# Patient Record
Sex: Male | Born: 1955 | Race: Black or African American | Hispanic: No | Smoking: Current some day smoker
Health system: Southern US, Community
[De-identification: ages and names within clinical notes are randomized; demographics above are authoritative.]

## PROBLEM LIST (undated history)

## (undated) DIAGNOSIS — Z87891 Personal history of nicotine dependence: Secondary | ICD-10-CM

## (undated) DIAGNOSIS — R569 Unspecified convulsions: Secondary | ICD-10-CM

## (undated) DIAGNOSIS — I10 Essential (primary) hypertension: Secondary | ICD-10-CM

## (undated) DIAGNOSIS — T4145XA Adverse effect of unspecified anesthetic, initial encounter: Secondary | ICD-10-CM

## (undated) DIAGNOSIS — I499 Cardiac arrhythmia, unspecified: Secondary | ICD-10-CM

## (undated) DIAGNOSIS — K2100 Gastro-esophageal reflux disease with esophagitis, without bleeding: Secondary | ICD-10-CM

## (undated) DIAGNOSIS — R51 Headache: Secondary | ICD-10-CM

## (undated) DIAGNOSIS — F419 Anxiety disorder, unspecified: Secondary | ICD-10-CM

## (undated) DIAGNOSIS — T8859XA Other complications of anesthesia, initial encounter: Secondary | ICD-10-CM

## (undated) DIAGNOSIS — N400 Enlarged prostate without lower urinary tract symptoms: Secondary | ICD-10-CM

## (undated) DIAGNOSIS — R519 Headache, unspecified: Secondary | ICD-10-CM

## (undated) DIAGNOSIS — E785 Hyperlipidemia, unspecified: Secondary | ICD-10-CM

## (undated) DIAGNOSIS — R0600 Dyspnea, unspecified: Secondary | ICD-10-CM

## (undated) DIAGNOSIS — R351 Nocturia: Secondary | ICD-10-CM

## (undated) DIAGNOSIS — M1990 Unspecified osteoarthritis, unspecified site: Secondary | ICD-10-CM

## (undated) DIAGNOSIS — R1310 Dysphagia, unspecified: Secondary | ICD-10-CM

## (undated) DIAGNOSIS — K219 Gastro-esophageal reflux disease without esophagitis: Secondary | ICD-10-CM

## (undated) HISTORY — PX: NASAL SEPTUM SURGERY: SHX37

## (undated) HISTORY — PX: OTHER SURGICAL HISTORY: SHX169

## (undated) HISTORY — PX: BACK SURGERY: SHX140

---

## 2013-10-20 ENCOUNTER — Encounter (HOSPITAL_COMMUNITY)
Admission: RE | Admit: 2013-10-20 | Discharge: 2013-10-20 | Disposition: A | Discharge status: Home / Self Care | Payer: Medicaid Other | Source: Ambulatory Visit | Attending: General Surgery | Admitting: General Surgery

## 2013-10-20 ENCOUNTER — Encounter (HOSPITAL_COMMUNITY): Payer: Self-pay

## 2013-10-20 HISTORY — DX: Essential (primary) hypertension: I10

## 2013-10-20 HISTORY — DX: Hyperlipidemia, unspecified: E78.5

## 2013-10-20 NOTE — OR Nursing (Signed)
Labs from [place] on paper chart

## 2013-10-20 NOTE — Patient Instructions (Addendum)
   Your procedure is scheduled on: 10/21/2013  Report to [facility] at   9:15  AM.  Call this number if you have problems the morning of surgery: [phone]   Remember:   Do not drink or eat food:After Midnight.  :  Take these medicines the morning of surgery with A SIP OF WATER: none   Do not wear jewelry, make-up or nail polish.  Do not wear lotions, powders, or perfumes. You may wear deodorant.  Do not shave 48 hours prior to surgery. Men may shave face and neck.  Do not bring valuables to the hospital.  Contacts, dentures or bridgework may not be worn into surgery.  Leave suitcase in the car. After surgery it may be brought to your room.  For patients admitted to the hospital, checkout time is 11:00 AM the day of discharge.   Patients discharged the day of surgery will not be allowed to drive home.    Special Instructions: Shower using CHG 2 nights before surgery and the night before surgery.  If you shower the day of surgery use CHG.  Use special wash - you have one bottle of CHG for all showers.  You should use approximately 1/3 of the bottle for each shower.   Please read over the following fact sheets that you were given: Pain Booklet, MRSA Information, Surgical Site Infection Prevention and Care and Recovery After Surgery

## 2013-10-21 ENCOUNTER — Encounter (HOSPITAL_COMMUNITY)
Admission: RE | Disposition: A | Discharge status: Home / Self Care | Payer: Self-pay | Source: Ambulatory Visit | Attending: General Surgery

## 2013-10-21 ENCOUNTER — Ambulatory Visit (HOSPITAL_COMMUNITY)
Admission: RE | Admit: 2013-10-21 | Discharge: 2013-10-21 | Disposition: A | Discharge status: Home / Self Care | Payer: Medicaid Other | Source: Ambulatory Visit | Attending: General Surgery | Admitting: General Surgery

## 2013-10-21 ENCOUNTER — Encounter (HOSPITAL_COMMUNITY): Payer: Medicaid Other | Admitting: Anesthesiology

## 2013-10-21 ENCOUNTER — Encounter (HOSPITAL_COMMUNITY): Payer: Self-pay | Admitting: *Deleted

## 2013-10-21 ENCOUNTER — Ambulatory Visit (HOSPITAL_COMMUNITY): Payer: Medicaid Other | Admitting: Anesthesiology

## 2013-10-21 DIAGNOSIS — I10 Essential (primary) hypertension: Secondary | ICD-10-CM | POA: Insufficient documentation

## 2013-10-21 DIAGNOSIS — D487 Neoplasm of uncertain behavior of other specified sites: Secondary | ICD-10-CM

## 2013-10-21 DIAGNOSIS — Z01810 Encounter for preprocedural cardiovascular examination: Secondary | ICD-10-CM | POA: Insufficient documentation

## 2013-10-21 DIAGNOSIS — L989 Disorder of the skin and subcutaneous tissue, unspecified: Secondary | ICD-10-CM | POA: Insufficient documentation

## 2013-10-21 DIAGNOSIS — D1739 Benign lipomatous neoplasm of skin and subcutaneous tissue of other sites: Secondary | ICD-10-CM | POA: Insufficient documentation

## 2013-10-21 HISTORY — PX: MASS EXCISION: SHX2000

## 2013-10-21 SURGERY — EXCISION MASS
Anesthesia: General | Site: Back | Wound class: Clean

## 2013-10-21 MED ORDER — ROCURONIUM BROMIDE 50 MG/5ML IV SOLN
INTRAVENOUS | Status: AC
  Filled 2013-10-21: qty 1

## 2013-10-21 MED ORDER — LACTATED RINGERS IV SOLN
INTRAVENOUS | Status: DC
  Administered 2013-10-21: 12:00:00 via INTRAVENOUS
  Administered 2013-10-21: 1000 mL via INTRAVENOUS

## 2013-10-21 MED ORDER — LIDOCAINE HCL (PF) 1 % IJ SOLN
INTRAMUSCULAR | Status: AC
  Filled 2013-10-21: qty 5

## 2013-10-21 MED ORDER — BACITRACIN-NEOMYCIN-POLYMYXIN 400-5-5000 EX OINT
TOPICAL_OINTMENT | CUTANEOUS | Status: AC
  Filled 2013-10-21: qty 2

## 2013-10-21 MED ORDER — EPHEDRINE SULFATE 50 MG/ML IJ SOLN
INTRAMUSCULAR | Status: DC | PRN
  Administered 2013-10-21: 5 mg via INTRAVENOUS

## 2013-10-21 MED ORDER — CEFAZOLIN SODIUM-DEXTROSE 2-3 GM-% IV SOLR
2.0000 g | Freq: Once | INTRAVENOUS | Status: AC
  Administered 2013-10-21: 2 g via INTRAVENOUS
  Filled 2013-10-21: qty 50

## 2013-10-21 MED ORDER — OXYCODONE-ACETAMINOPHEN 10-325 MG PO TABS: 1.0000 | ORAL_TABLET | ORAL | Status: DC | PRN

## 2013-10-21 MED ORDER — PROPOFOL 10 MG/ML IV BOLUS
INTRAVENOUS | Status: DC | PRN
  Administered 2013-10-21 (×2): 20 mg via INTRAVENOUS
  Administered 2013-10-21: 150 mg via INTRAVENOUS

## 2013-10-21 MED ORDER — MIDAZOLAM HCL 2 MG/2ML IJ SOLN
1.0000 mg | INTRAMUSCULAR | Status: AC | PRN
  Administered 2013-10-21 (×3): 2 mg via INTRAVENOUS
  Filled 2013-10-21 (×2): qty 2

## 2013-10-21 MED ORDER — EPHEDRINE SULFATE 50 MG/ML IJ SOLN
INTRAMUSCULAR | Status: AC
  Filled 2013-10-21: qty 1

## 2013-10-21 MED ORDER — FENTANYL CITRATE 0.05 MG/ML IJ SOLN
INTRAMUSCULAR | Status: DC | PRN
  Administered 2013-10-21 (×5): 25 ug via INTRAVENOUS
  Administered 2013-10-21: 50 ug via INTRAVENOUS
  Administered 2013-10-21: 25 ug via INTRAVENOUS

## 2013-10-21 MED ORDER — FENTANYL CITRATE 0.05 MG/ML IJ SOLN
INTRAMUSCULAR | Status: AC
  Filled 2013-10-21: qty 2

## 2013-10-21 MED ORDER — GLYCOPYRROLATE 0.2 MG/ML IJ SOLN
INTRAMUSCULAR | Status: AC
  Filled 2013-10-21: qty 2

## 2013-10-21 MED ORDER — 0.9 % SODIUM CHLORIDE (POUR BTL) OPTIME
TOPICAL | Status: DC | PRN
  Administered 2013-10-21: 1000 mL

## 2013-10-21 MED ORDER — GLYCOPYRROLATE 0.2 MG/ML IJ SOLN
INTRAMUSCULAR | Status: AC
  Filled 2013-10-21: qty 1

## 2013-10-21 MED ORDER — SODIUM CHLORIDE BACTERIOSTATIC 0.9 % IJ SOLN
INTRAMUSCULAR | Status: AC
  Filled 2013-10-21: qty 10

## 2013-10-21 MED ORDER — GLYCOPYRROLATE 0.2 MG/ML IJ SOLN
INTRAMUSCULAR | Status: DC | PRN
  Administered 2013-10-21: 0.4 mg via INTRAVENOUS
  Administered 2013-10-21: 0.2 mg via INTRAVENOUS

## 2013-10-21 MED ORDER — PROPOFOL 10 MG/ML IV BOLUS
INTRAVENOUS | Status: AC
  Filled 2013-10-21: qty 20

## 2013-10-21 MED ORDER — FENTANYL CITRATE 0.05 MG/ML IJ SOLN
25.0000 ug | INTRAMUSCULAR | Status: DC | PRN
  Administered 2013-10-21 (×3): 50 ug via INTRAVENOUS
  Filled 2013-10-21: qty 2

## 2013-10-21 MED ORDER — BACITRACIN ZINC 500 UNIT/GM EX OINT
TOPICAL_OINTMENT | CUTANEOUS | Status: AC
  Filled 2013-10-21: qty 0.9

## 2013-10-21 MED ORDER — ONDANSETRON HCL 4 MG/2ML IJ SOLN
4.0000 mg | Freq: Once | INTRAMUSCULAR | Status: AC
  Administered 2013-10-21: 4 mg via INTRAVENOUS
  Filled 2013-10-21: qty 2

## 2013-10-21 MED ORDER — NEOSTIGMINE METHYLSULFATE 1 MG/ML IJ SOLN
INTRAMUSCULAR | Status: DC | PRN
  Administered 2013-10-21: 2 mg via INTRAVENOUS

## 2013-10-21 MED ORDER — MIDAZOLAM HCL 2 MG/2ML IJ SOLN
INTRAMUSCULAR | Status: AC
  Filled 2013-10-21: qty 2

## 2013-10-21 MED ORDER — LIDOCAINE HCL (CARDIAC) 10 MG/ML IV SOLN
INTRAVENOUS | Status: DC | PRN
  Administered 2013-10-21: 20 mg via INTRAVENOUS

## 2013-10-21 MED ORDER — ROCURONIUM BROMIDE 100 MG/10ML IV SOLN
INTRAVENOUS | Status: DC | PRN
  Administered 2013-10-21: 35 mg via INTRAVENOUS
  Administered 2013-10-21: 5 mg via INTRAVENOUS
  Administered 2013-10-21: 10 mg via INTRAVENOUS

## 2013-10-21 MED ORDER — ONDANSETRON HCL 4 MG/2ML IJ SOLN: 4.0000 mg | Freq: Once | INTRAMUSCULAR | Status: DC | PRN

## 2013-10-21 MED ORDER — BUPIVACAINE HCL (PF) 0.5 % IJ SOLN
INTRAMUSCULAR | Status: AC
  Filled 2013-10-21: qty 30

## 2013-10-21 MED ORDER — STERILE WATER FOR IRRIGATION IR SOLN
Status: DC | PRN
  Administered 2013-10-21: 1000 mL

## 2013-10-21 SURGICAL SUPPLY — 39 items
0.5% BUPIVICAINE 30ML ×2 IMPLANT
BAG HAMPER (MISCELLANEOUS) ×2 IMPLANT
CLOTH BEACON ORANGE TIMEOUT ST (SAFETY) ×2 IMPLANT
COVER LIGHT HANDLE STERIS (MISCELLANEOUS) ×4 IMPLANT
DECANTER SPIKE VIAL GLASS SM (MISCELLANEOUS) ×2 IMPLANT
DRAIN PENROSE 18X1/2 LTX STRL (DRAIN) ×2 IMPLANT
ELECT NEEDLE TIP 2.8 STRL (NEEDLE) IMPLANT
ELECT REM PT RETURN 9FT ADLT (ELECTROSURGICAL) ×2
ELECTRODE REM PT RTRN 9FT ADLT (ELECTROSURGICAL) ×1 IMPLANT
FORMALIN 10 PREFIL 120ML (MISCELLANEOUS) ×2 IMPLANT
GLOVE BIOGEL PI IND STRL 7.0 (GLOVE) ×3 IMPLANT
GLOVE BIOGEL PI INDICATOR 7.0 (GLOVE) ×3
GLOVE ECLIPSE 7.0 STRL STRAW (GLOVE) ×4 IMPLANT
GLOVE SKINSENSE NS SZ7.0 (GLOVE) ×2
GLOVE SKINSENSE STRL SZ7.0 (GLOVE) ×2 IMPLANT
GLOVE SS BIOGEL STRL SZ 6.5 (GLOVE) ×1 IMPLANT
GLOVE SUPERSENSE BIOGEL SZ 6.5 (GLOVE) ×1
GOWN STRL REIN XL XLG (GOWN DISPOSABLE) ×6 IMPLANT
KIT ROOM TURNOVER APOR (KITS) ×2 IMPLANT
MANIFOLD NEPTUNE II (INSTRUMENTS) ×2 IMPLANT
NEEDLE HYPO 25X1 1.5 SAFETY (NEEDLE) IMPLANT
NS IRRIG 1000ML POUR BTL (IV SOLUTION) ×2 IMPLANT
PACK MINOR (CUSTOM PROCEDURE TRAY) IMPLANT
PAD ABD 5X9 TENDERSORB (GAUZE/BANDAGES/DRESSINGS) ×4 IMPLANT
PAD ARMBOARD 7.5X6 YLW CONV (MISCELLANEOUS) ×2 IMPLANT
SET BASIN LINEN APH (SET/KITS/TRAYS/PACK) ×2 IMPLANT
SPONGE GAUZE 4X4 12PLY (GAUZE/BANDAGES/DRESSINGS) ×2 IMPLANT
SPONGE LAP 18X18 X RAY DECT (DISPOSABLE) ×2 IMPLANT
SUT ETHILON 3 0 FSL (SUTURE) ×4 IMPLANT
SUT ETHILON 4 0 PS 2 18 (SUTURE) ×6 IMPLANT
SUT PROLENE 4 0 PS 2 18 (SUTURE) IMPLANT
SUT VIC AB 3-0 SH 27 (SUTURE)
SUT VIC AB 3-0 SH 27X BRD (SUTURE) IMPLANT
SUT VIC AB 4-0 PS2 27 (SUTURE) IMPLANT
SYR BULB IRRIGATION 50ML (SYRINGE) ×2 IMPLANT
SYR CONTROL 10ML LL (SYRINGE) IMPLANT
TAPE CLOTH SURG 4X10 WHT LF (GAUZE/BANDAGES/DRESSINGS) ×2 IMPLANT
TOWEL OR 17X26 4PK STRL BLUE (TOWEL DISPOSABLE) ×2 IMPLANT
WATER STERILE IRR 1000ML POUR (IV SOLUTION) ×4 IMPLANT

## 2013-10-21 NOTE — Anesthesia Postprocedure Evaluation (Addendum)
  Anesthesia Post-op Note  Patient: Xavier Livingston  Procedure(s) Performed: Procedure(s): EXCISION MASSES X 2 ON BACK (N/A)  Patient Location: PACU  Anesthesia Type:General  Level of Consciousness: awake, alert  and patient cooperative  Airway and Oxygen Therapy: Patient Spontanous Breathing  Post-op Pain:  7/10  Post-op Assessment: Post-op Vital signs reviewed, Patient's Cardiovascular Status Stable, Respiratory Function Stable and Patent Airway  Post-op Vital Signs: Reviewed and stable  Complications: No apparent anesthesia complications

## 2013-10-21 NOTE — Progress Notes (Signed)
Post OP check  Filed Vitals:   10/21/13 1215  BP: 131/95  Pulse: 95  Temp: 98.2 F (36.8 C)  Resp: 14   Dressing dry and in tact.  Pt explained that there might be some drainage and insrtucted to reinforce dressing and that I will change dressing and remove drain AM.  Pt having minimal pain and will discharge with Percocet if tylenol not sufficient.

## 2013-10-21 NOTE — Progress Notes (Signed)
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 Ago.  Procedure and risks explained and informed consent obtained.  Lesions marked pre op and labs reviewed.  No clinical change since H&P. Filed Vitals:   10/21/13 0945  BP: 126/88  Pulse:   Temp:   Resp:   temp. 97.7 , pulse 70, resp 22

## 2013-10-21 NOTE — Transfer of Care (Signed)
Immediate Anesthesia Transfer of Care Note  Patient: Xavier Livingston  Procedure(s) Performed: Procedure(s) (LRB): EXCISION MASSES X 2 ON BACK (N/A)  Patient Location: PACU  Anesthesia Type: General  Level of Consciousness: awake  Airway & Oxygen Therapy: Patient Spontanous Breathing    Post-op Assessment: Report given to PACU RN, Post -op Vital signs reviewed and stable and Patient moving all extremities  Post vital signs: Reviewed and stable  Complications: No apparent anesthesia complications

## 2013-10-21 NOTE — Anesthesia Preprocedure Evaluation (Signed)
Anesthesia Evaluation  Patient identified by MRN, date of birth, ID band Patient awake    Reviewed: Allergy & Precautions, H&P , NPO status , Patient's Chart, lab work & pertinent test results  Airway Mallampati: II TM Distance: >3 FB Neck ROM: Full    Dental  (+) Edentulous Upper and Edentulous Lower   Pulmonary Current Smoker (am cough),  breath sounds clear to auscultation        Cardiovascular hypertension, + dysrhythmias (hx irreg HR-no meds) Rhythm:Regular Rate:Normal     Neuro/Psych    GI/Hepatic negative GI ROS, GERD- (occasional)  ,  Endo/Other    Renal/GU      Musculoskeletal   Abdominal   Peds  Hematology   Anesthesia Other Findings   Reproductive/Obstetrics                           Anesthesia Physical Anesthesia Plan  ASA: II  Anesthesia Plan: General   Post-op Pain Management:    Induction: Intravenous, Rapid sequence and Cricoid pressure planned  Airway Management Planned: Oral ETT  Additional Equipment:   Intra-op Plan:   Post-operative Plan: Extubation in OR  Informed Consent: I have reviewed the patients History and Physical, chart, labs and discussed the procedure including the risks, benefits and alternatives for the proposed anesthesia with the patient or authorized representative who has indicated his/her understanding and acceptance.     Plan Discussed with:   Anesthesia Plan Comments:         Anesthesia Quick Evaluation

## 2013-10-21 NOTE — Anesthesia Procedure Notes (Signed)
Procedure Name: Intubation Date/Time: 10/21/2013 10:23 AM Performed by: Franco Nones Pre-anesthesia Checklist: Patient identified, Patient being monitored, Timeout performed, Emergency Drugs available and Suction available Patient Re-evaluated:Patient Re-evaluated prior to inductionOxygen Delivery Method: Circle System Utilized Preoxygenation: Pre-oxygenation with 100% oxygen Intubation Type: IV induction Ventilation: Mask ventilation without difficulty Laryngoscope Size: Miller and 2 Grade View: Grade I Tube type: Oral Tube size: 8.0 mm Number of attempts: 1 Airway Equipment and Method: stylet Placement Confirmation: ETT inserted through vocal cords under direct vision,  positive ETCO2 and breath sounds checked- equal and bilateral Secured at: 21 cm Tube secured with: Tape Dental Injury: Teeth and Oropharynx as per pre-operative assessment

## 2013-10-21 NOTE — Brief Op Note (Signed)
10/21/2013  12:20 PM  PATIENT:  Xavier Livingston  57 y.o. male  PRE-OPERATIVE DIAGNOSIS:  masses x 2 on back  POST-OPERATIVE DIAGNOSIS:  masses x 2 on back  PROCEDURE:  Procedure(s): EXCISION MASSES X 2 ON BACK (N/A)  SURGEON:  Surgeon(s) and Role:    * Marlane Hatcher, MD - Primary  PHYSICIAN ASSISTANT:   ASSISTANTS: none   ANESTHESIA:   general  EBL:  Total I/O In: 1000 [I.V.:1000] Out: 70 [Blood:70]  BLOOD ADMINISTERED:none  DRAINS: Penrose drain in the half inch penrose in sub cut space back.   LOCAL MEDICATIONS USED:  NONE  SPECIMEN:  Source of Specimen:  2 back lesions one midline the other smaller lesion left lower back.  DISPOSITION OF SPECIMEN:  PATHOLOGY  COUNTS:  YES  TOURNIQUET:  * No tourniquets in log *  DICTATION: .Other Dictation: Dictation Number OR dict. # [id number].  PLAN OF CARE: Discharge to home after PACU  PATIENT DISPOSITION:  PACU - hemodynamically stable.   Delay start of Pharmacological VTE agent (>24hrs) due to surgical blood loss or risk of bleeding: not applicable

## 2013-10-22 ENCOUNTER — Encounter (HOSPITAL_COMMUNITY): Payer: Self-pay | Admitting: General Surgery

## 2013-10-22 MED ORDER — BACITRACIN-NEOMYCIN-POLYMYXIN 400-5-5000 EX OINT
TOPICAL_OINTMENT | CUTANEOUS | Status: DC | PRN
  Administered 2013-10-21: 1 via TOPICAL

## 2013-10-22 NOTE — Op Note (Signed)
NAME:  Xavier Livingston, BASTON NO.:  [id number]  MEDICAL RECORD NO.:  [id number]  LOCATION:  [facility]                          FACILITY:  [facility]  PHYSICIAN:  Barbaraann Barthel, M.D. DATE OF BIRTH:  1956/05/01  DATE OF PROCEDURE:  10/21/2013 DATE OF DISCHARGE:  10/21/2013                              OPERATIVE REPORT   DIAGNOSIS:  Two back lesions (1 large lesion approximately the size of a softball.  It is likely a recurrent lipoma). The second lower lesion of left lower back about the size of a golf ball.  WOUND CLASSIFICATION:  Clean.  NOTE:  This is a 57 year old African American who had a previously removed lesion from his mid back area approximately 4 years ago.  This had recurred and was the size of a softball making it very difficult for him to lie down with any kind of comfort.  He also had a smaller lesion that was on a left lower back.  We discussed removal of this via the outpatient department, discussing complications, not limited to, but including bleeding, infection, the possibility that the drain may be placed and also that he may require a further surgery depending on the Pathology.  Informed consent was obtained.  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  TECHNIQUE:  The patient was placed in the right lateral decubitus position, and after prepping with Betadine solution and draping in the usual manner of the previously marked area in the left lower back was in size with a transverse incision, and this was dissected free and the wound was irrigated and then the bleeding was controlled with a cautery device and the wound was approximated with a 4-0 nylon.  Attention was then turned  to the larger lesion.  This was removed with a vertical incision as this was right over the spinal column.  Skin and subcutaneous tissue was excised and the large lipoma was then dissected free.  As there was some scarring from previous surgery, this was somewhat tedious.  However, there was no problem.  Bleeding was controlled with minimal blood loss less than 25 mL and since this left a large cavity, I elected to leave a Penrose drain and this exited from the wound site.  I will remove this drain in less than 24 hours.  The wound was approximated with 3-0 nylon and 4-0 nylon.  The drain was sutured in place with 4-0 nylon.  The sterile dressing and ABD pad was applied over the wound.  We did use Neosporin over both wounds as well.  Prior to closure, all sponge, needle, and instrument counts were found to be correct.  Estimated blood loss was as stated less than 25 mL.  There were no complications.     Barbaraann Barthel, M.D.     WB/MEDQ  D:  10/21/2013  T:  10/22/2013  Job:  [id number]  cc:  Melvyn Novas, MD Fax: [phone]

## 2013-11-18 ENCOUNTER — Other Ambulatory Visit (HOSPITAL_COMMUNITY): Payer: Self-pay | Admitting: General Surgery

## 2013-11-18 DIAGNOSIS — IMO0002 Reserved for concepts with insufficient information to code with codable children: Secondary | ICD-10-CM

## 2013-11-23 ENCOUNTER — Ambulatory Visit (HOSPITAL_COMMUNITY)
Admission: RE | Admit: 2013-11-23 | Discharge: 2013-11-23 | Disposition: A | Discharge status: Home / Self Care | Payer: Medicaid Other | Source: Ambulatory Visit | Attending: General Surgery | Admitting: General Surgery

## 2013-11-23 ENCOUNTER — Other Ambulatory Visit (HOSPITAL_COMMUNITY): Payer: Self-pay | Admitting: General Surgery

## 2013-11-23 DIAGNOSIS — R200 Anesthesia of skin: Secondary | ICD-10-CM

## 2013-11-23 DIAGNOSIS — R209 Unspecified disturbances of skin sensation: Secondary | ICD-10-CM | POA: Insufficient documentation

## 2013-11-23 DIAGNOSIS — M25519 Pain in unspecified shoulder: Secondary | ICD-10-CM | POA: Insufficient documentation

## 2013-11-24 ENCOUNTER — Ambulatory Visit (HOSPITAL_COMMUNITY)
Admission: RE | Admit: 2013-11-24 | Discharge: 2013-11-24 | Disposition: A | Discharge status: Home / Self Care | Payer: Medicaid Other | Source: Ambulatory Visit | Attending: General Surgery | Admitting: General Surgery

## 2013-12-09 ENCOUNTER — Other Ambulatory Visit (HOSPITAL_COMMUNITY)
Admission: RE | Admit: 2013-12-09 | Discharge: 2013-12-09 | Disposition: A | Discharge status: Home / Self Care | Payer: Medicaid Other | Source: Ambulatory Visit | Attending: General Surgery | Admitting: General Surgery

## 2013-12-09 DIAGNOSIS — D1779 Benign lipomatous neoplasm of other sites: Secondary | ICD-10-CM | POA: Insufficient documentation

## 2014-01-12 ENCOUNTER — Ambulatory Visit (HOSPITAL_COMMUNITY)
Admission: RE | Admit: 2014-01-12 | Discharge: 2014-01-12 | Disposition: A | Discharge status: Home / Self Care | Payer: Medicaid Other | Source: Ambulatory Visit | Attending: Family Medicine | Admitting: Family Medicine

## 2014-01-12 ENCOUNTER — Other Ambulatory Visit (HOSPITAL_COMMUNITY): Payer: Self-pay | Admitting: Family Medicine

## 2014-01-12 DIAGNOSIS — M1990 Unspecified osteoarthritis, unspecified site: Secondary | ICD-10-CM

## 2014-01-12 DIAGNOSIS — M25519 Pain in unspecified shoulder: Secondary | ICD-10-CM | POA: Insufficient documentation

## 2014-02-25 ENCOUNTER — Encounter (INDEPENDENT_AMBULATORY_CARE_PROVIDER_SITE_OTHER): Payer: Self-pay | Admitting: *Deleted

## 2014-02-25 IMAGING — CR DG SHOULDER 2+V*R*
3 series · 3 of 3 positions shown · non-contrast
Comparison: None

CLINICAL DATA: Right shoulder pain at clavicle-deltoid groove,
numbness

EXAM:
RIGHT SHOULDER - 2+ VIEW

[view not recorded (1 of 3)]
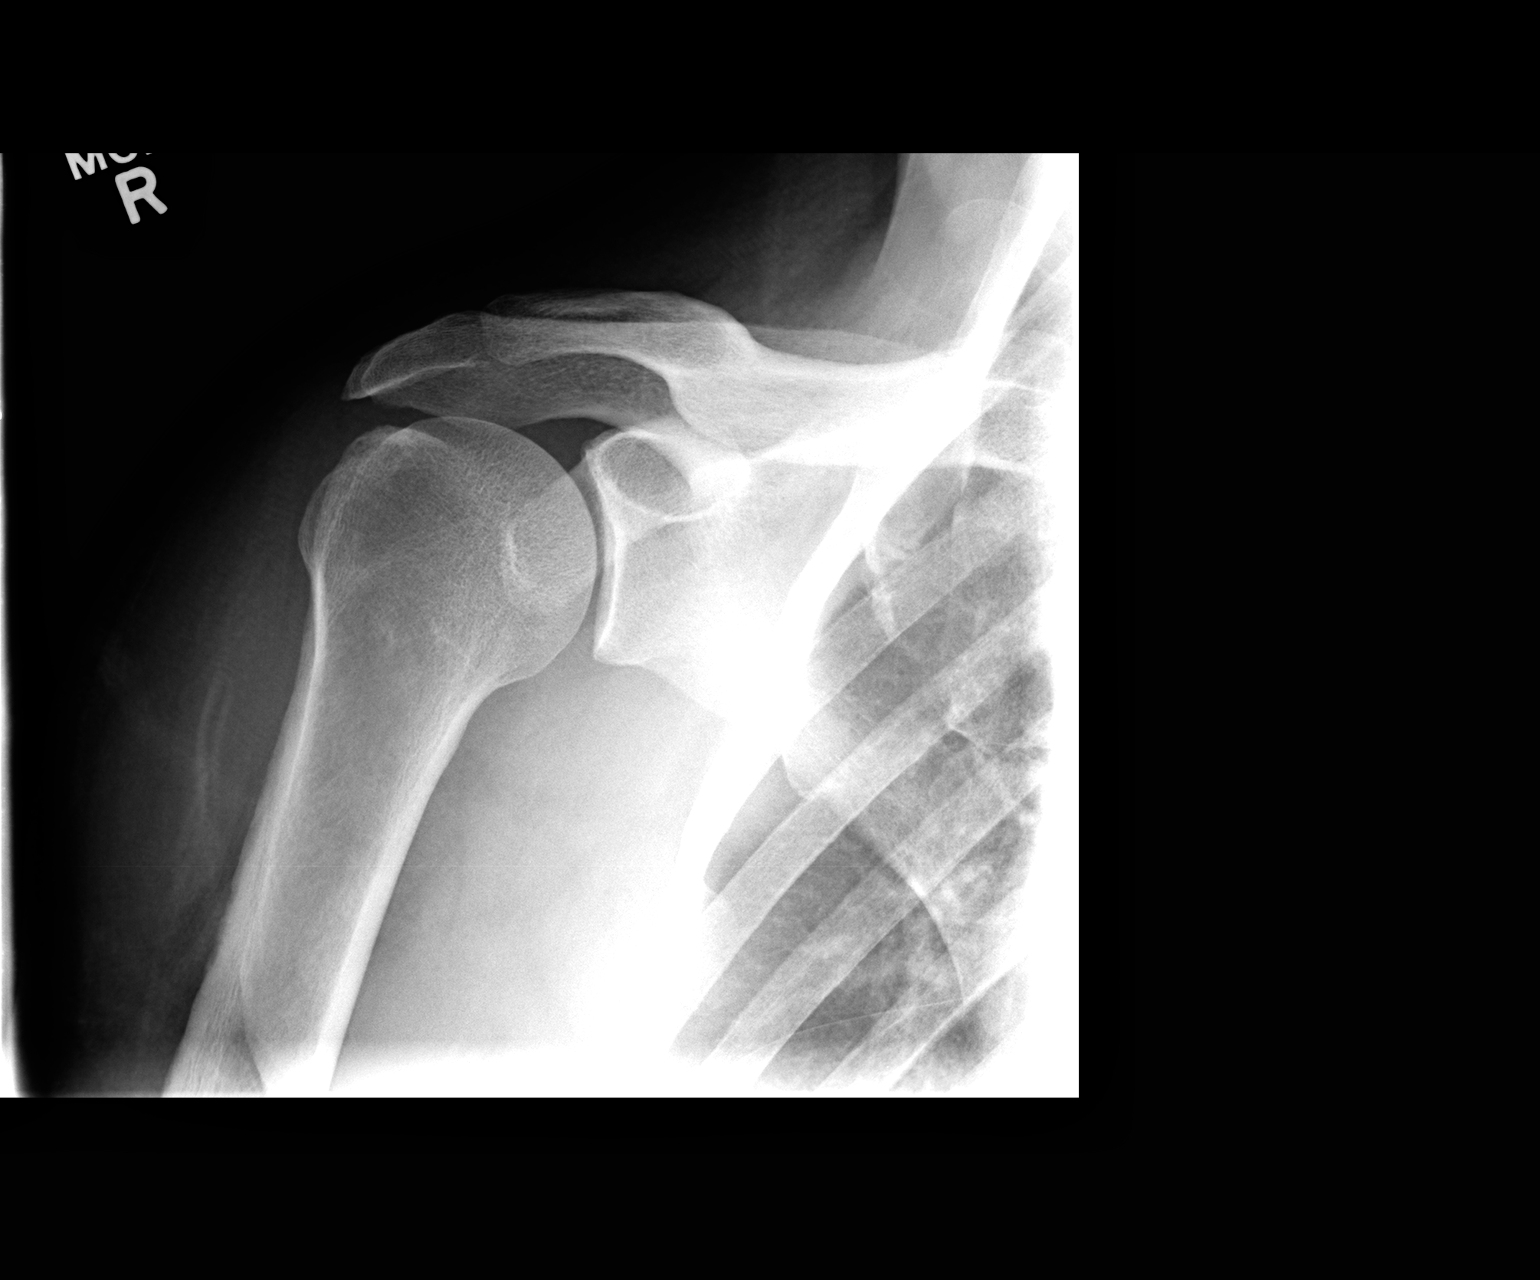

[view not recorded (2 of 3)]
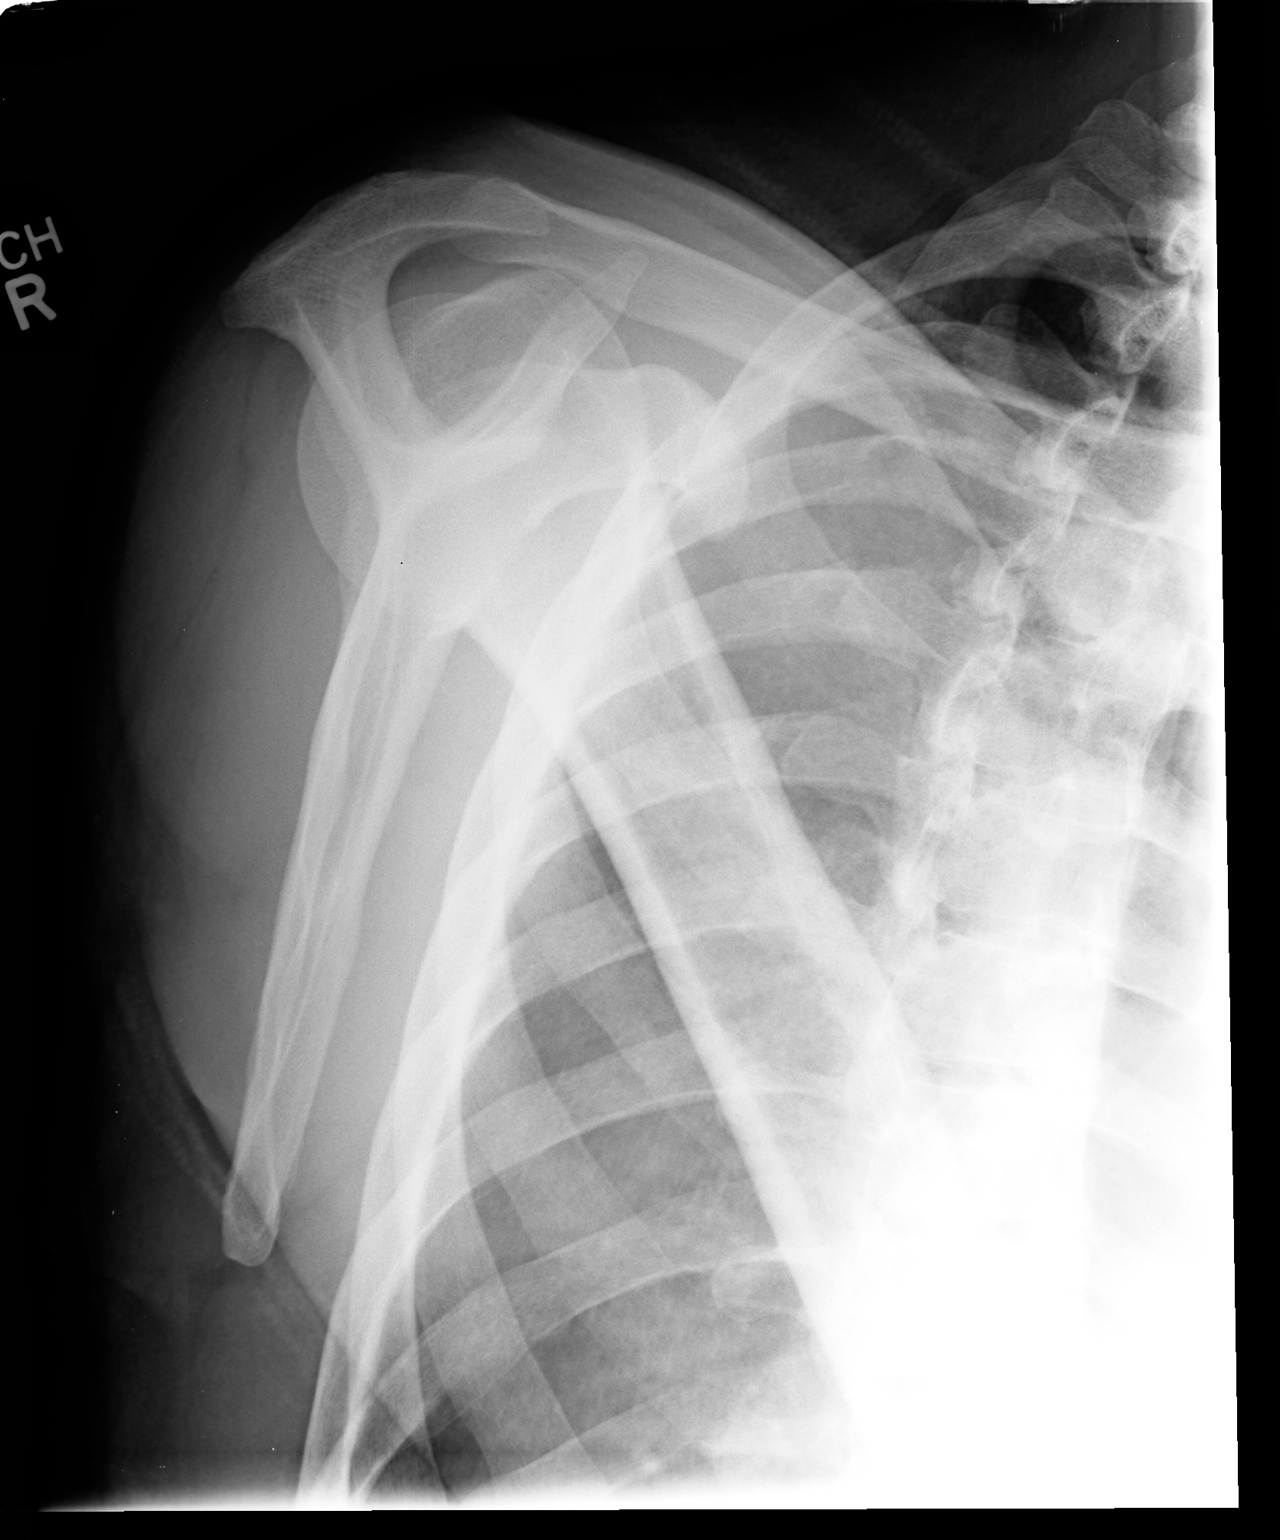

[view not recorded (3 of 3)]
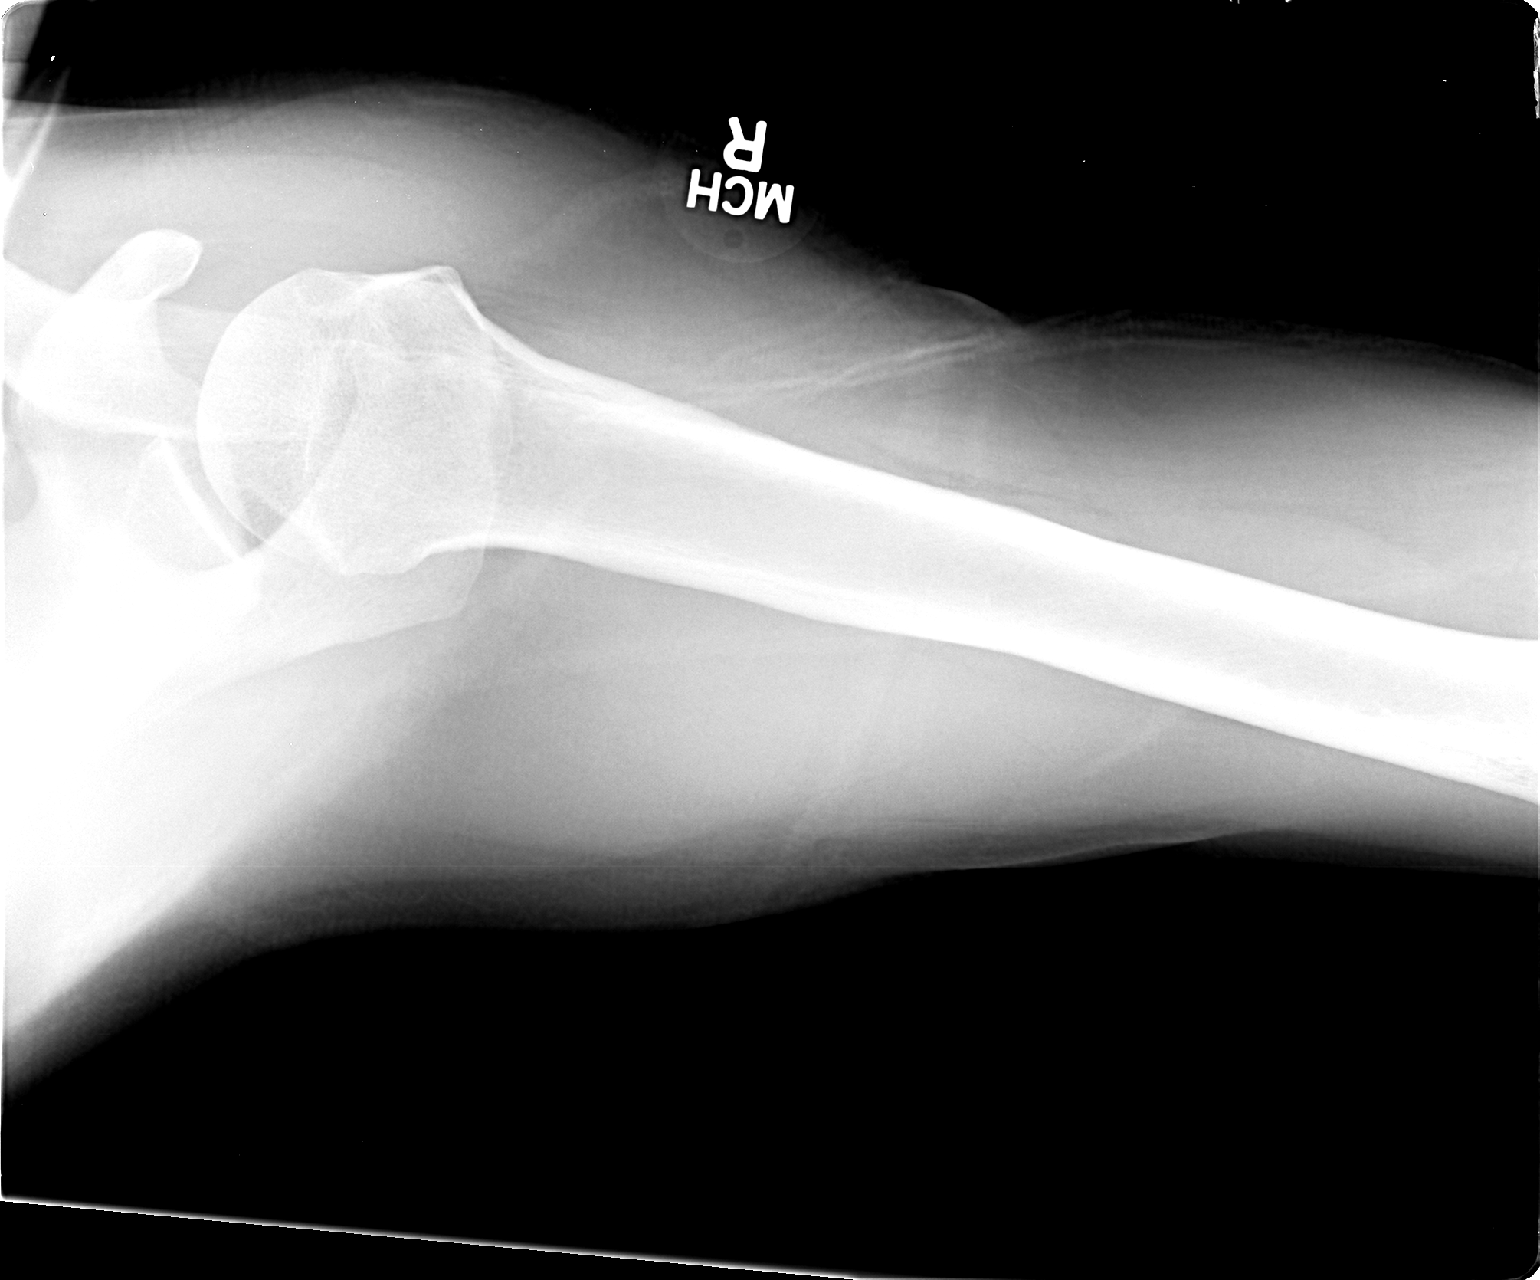

[3 of 3 positions shown; findings below may reference images not displayed]

FINDINGS: Osseous mineralization normal.

AC joint alignment normal.

No acute fracture, dislocation or bone destruction.

Visualized left ribs unremarkable.
IMPRESSION: Normal exam.

## 2014-03-17 ENCOUNTER — Encounter (INDEPENDENT_AMBULATORY_CARE_PROVIDER_SITE_OTHER): Payer: Self-pay | Admitting: Internal Medicine

## 2014-03-17 ENCOUNTER — Ambulatory Visit (INDEPENDENT_AMBULATORY_CARE_PROVIDER_SITE_OTHER): Payer: Medicaid Other | Admitting: Internal Medicine

## 2014-03-17 VITALS — BP 112/62 | HR 60 | Temp 98.2°F | Ht 71.0 in | Wt 179.2 lb

## 2014-03-17 DIAGNOSIS — R1310 Dysphagia, unspecified: Secondary | ICD-10-CM | POA: Insufficient documentation

## 2014-03-17 DIAGNOSIS — E7800 Pure hypercholesterolemia, unspecified: Secondary | ICD-10-CM | POA: Insufficient documentation

## 2014-03-17 DIAGNOSIS — I10 Essential (primary) hypertension: Secondary | ICD-10-CM | POA: Insufficient documentation

## 2014-03-17 MED ORDER — OMEPRAZOLE 20 MG PO CPDR: 20.0000 mg | DELAYED_RELEASE_CAPSULE | Freq: Every day | ORAL | Status: DC

## 2014-03-17 NOTE — Progress Notes (Signed)
Subjective:     Patient ID: Xavier Livingston, male   DOB: 12-06-1955, 58 y.o.   MRN: [id number]  HPI Referred to our office by [facility] for dysphagia. Dysphagia x 5 yrs. If he drinks water, he has to lean over to have the water go down. Foods are also lodging.   He says sometimes he can go a week and not have any problems with swallowing. He does not have problems with meat. Appetite is good. No weight loss. He does have acid reflux about twice a week. Occasionally has upper abdominal pain. Usually has a BM about once a day. No melena or bright red rectal bleeding. EGD 3 1/2 yrs ago for same in [place], [place] . He does not know what the results were.  He has never undergone a colonoscopy in the past.    Review of Systems Past Medical History  Diagnosis Date  . Hypertension   . Hyperlipemia     Past Surgical History  Procedure Laterality Date  . Nasal septum surgery    . Excision of mass of back    . Mass excision N/A 10/21/2013    Procedure: EXCISION MASSES X 2 ON BACK;  Surgeon: Scherry Ran, MD;  Location: [facility];  Service: General;  Laterality: N/A;    No Known Allergies  Current Outpatient Prescriptions on File Prior to Visit  Medication Sig Dispense Refill  . pravastatin (PRAVACHOL) 40 MG tablet Take 40 mg by mouth daily.       No current facility-administered medications on file prior to visit.        Objective:   Physical Exam  Filed Vitals:   03/17/14 1057  BP: 112/62  Pulse: 60  Temp: 98.2 F (36.8 C)  Height: 5\' 11"  (1.803 m)  Weight: 179 lb 3.2 oz (81.285 kg)   Alert and oriented. Skin warm and dry. Oral mucosa is moist.   . Sclera anicteric, conjunctivae is pink. Thyroid not enlarged. No cervical lymphadenopathy. Lungs clear. Heart regular rate and rhythm.  Abdomen is soft. Bowel sounds are positive. No hepatomegaly. No abdominal masses felt. Epigastric tenderness.  No edema to lower extremities.     Assessment:     Dyshagia to solids and  liquids. Stricture needs to be ruled out. GERD. Is not on a PPI at this time. In need of screening colonoscopy.    Plan:     Screening colonoscopy. Barium swallow. Further recommendations to follow.  Omeprazole 20mg  once a day.    Will hold the colonoscopy till she has the Barium Swallow.

## 2014-03-17 NOTE — Patient Instructions (Signed)
Rx for Omeprazole called to his pharmacy. Will schedule a Barium Swallow.

## 2014-03-23 ENCOUNTER — Ambulatory Visit (HOSPITAL_COMMUNITY)
Admission: RE | Admit: 2014-03-23 | Discharge: 2014-03-23 | Disposition: A | Discharge status: Home / Self Care | Payer: Medicaid Other | Source: Ambulatory Visit | Attending: Family Medicine | Admitting: Family Medicine

## 2014-03-23 ENCOUNTER — Other Ambulatory Visit (HOSPITAL_COMMUNITY): Payer: Self-pay | Admitting: Family Medicine

## 2014-03-23 DIAGNOSIS — M1990 Unspecified osteoarthritis, unspecified site: Secondary | ICD-10-CM

## 2014-03-23 DIAGNOSIS — M549 Dorsalgia, unspecified: Secondary | ICD-10-CM | POA: Insufficient documentation

## 2014-03-23 DIAGNOSIS — IMO0002 Reserved for concepts with insufficient information to code with codable children: Secondary | ICD-10-CM

## 2014-03-24 ENCOUNTER — Ambulatory Visit (HOSPITAL_COMMUNITY)
Admission: RE | Admit: 2014-03-24 | Discharge: 2014-03-24 | Disposition: A | Discharge status: Home / Self Care | Payer: Medicaid Other | Source: Ambulatory Visit | Attending: Internal Medicine | Admitting: Internal Medicine

## 2014-03-24 DIAGNOSIS — R1310 Dysphagia, unspecified: Secondary | ICD-10-CM

## 2014-04-02 ENCOUNTER — Telehealth (INDEPENDENT_AMBULATORY_CARE_PROVIDER_SITE_OTHER): Payer: Self-pay | Admitting: Internal Medicine

## 2014-04-06 ENCOUNTER — Other Ambulatory Visit (HOSPITAL_COMMUNITY): Payer: Self-pay | Admitting: Family Medicine

## 2014-04-06 ENCOUNTER — Ambulatory Visit (HOSPITAL_COMMUNITY)
Admission: RE | Admit: 2014-04-06 | Discharge: 2014-04-06 | Disposition: A | Discharge status: Home / Self Care | Payer: Medicaid Other | Source: Ambulatory Visit | Attending: Family Medicine | Admitting: Family Medicine

## 2014-04-06 DIAGNOSIS — R05 Cough: Secondary | ICD-10-CM | POA: Insufficient documentation

## 2014-04-06 DIAGNOSIS — R053 Chronic cough: Secondary | ICD-10-CM

## 2014-04-06 DIAGNOSIS — Z77090 Contact with and (suspected) exposure to asbestos: Secondary | ICD-10-CM | POA: Insufficient documentation

## 2014-04-06 DIAGNOSIS — R059 Cough, unspecified: Secondary | ICD-10-CM | POA: Insufficient documentation

## 2014-04-06 NOTE — Telephone Encounter (Signed)
error 

## 2014-05-04 ENCOUNTER — Encounter (INDEPENDENT_AMBULATORY_CARE_PROVIDER_SITE_OTHER): Payer: Self-pay | Admitting: *Deleted

## 2014-05-17 ENCOUNTER — Telehealth (INDEPENDENT_AMBULATORY_CARE_PROVIDER_SITE_OTHER): Payer: Self-pay | Admitting: *Deleted

## 2014-05-17 ENCOUNTER — Other Ambulatory Visit (INDEPENDENT_AMBULATORY_CARE_PROVIDER_SITE_OTHER): Payer: Self-pay | Admitting: *Deleted

## 2014-05-17 ENCOUNTER — Encounter (INDEPENDENT_AMBULATORY_CARE_PROVIDER_SITE_OTHER): Payer: Self-pay | Admitting: *Deleted

## 2014-05-17 DIAGNOSIS — Z1211 Encounter for screening for malignant neoplasm of colon: Secondary | ICD-10-CM

## 2014-05-17 NOTE — Telephone Encounter (Signed)
Patient was wanting to schedule a TCS. Message transferred to [facility].

## 2014-05-17 NOTE — Telephone Encounter (Signed)
TCS sch'd 08/18/14, patient aware

## 2014-05-17 NOTE — Telephone Encounter (Signed)
Phone call came into office Saturday at 2:31  No message left only very loud music playing

## 2014-05-17 NOTE — Telephone Encounter (Signed)
Thank you :)

## 2014-05-21 ENCOUNTER — Other Ambulatory Visit (HOSPITAL_COMMUNITY): Payer: Self-pay | Admitting: Family Medicine

## 2014-05-21 ENCOUNTER — Ambulatory Visit (HOSPITAL_COMMUNITY)
Admission: RE | Admit: 2014-05-21 | Discharge: 2014-05-21 | Disposition: A | Discharge status: Home / Self Care | Payer: Medicaid Other | Source: Ambulatory Visit | Attending: Family Medicine | Admitting: Family Medicine

## 2014-05-21 DIAGNOSIS — M545 Low back pain, unspecified: Secondary | ICD-10-CM | POA: Insufficient documentation

## 2014-05-21 DIAGNOSIS — IMO0002 Reserved for concepts with insufficient information to code with codable children: Secondary | ICD-10-CM

## 2014-07-05 ENCOUNTER — Telehealth (INDEPENDENT_AMBULATORY_CARE_PROVIDER_SITE_OTHER): Payer: Self-pay | Admitting: *Deleted

## 2014-07-05 DIAGNOSIS — Z1211 Encounter for screening for malignant neoplasm of colon: Secondary | ICD-10-CM

## 2014-07-05 NOTE — Telephone Encounter (Signed)
Patient needs movi prep 

## 2014-07-07 MED ORDER — PEG-KCL-NACL-NASULF-NA ASC-C 100 G PO SOLR: 1.0000 | Freq: Once | ORAL | Status: DC

## 2014-07-08 ENCOUNTER — Encounter (INDEPENDENT_AMBULATORY_CARE_PROVIDER_SITE_OTHER): Payer: Self-pay | Admitting: *Deleted

## 2014-07-22 ENCOUNTER — Telehealth (INDEPENDENT_AMBULATORY_CARE_PROVIDER_SITE_OTHER): Payer: Self-pay | Admitting: *Deleted

## 2014-07-22 NOTE — Telephone Encounter (Signed)
  Procedure: tcs  Reason/Indication:  screening  Has patient had this procedure before?  no  If so, when, by whom and where?    Is there a family history of colon cancer?  no  Who?  What age when diagnosed?    Is patient diabetic?   no      Does patient have prosthetic heart valve?  no  Do you have a pacemaker?  no  Has patient ever had endocarditis? no  Has patient had joint replacement within last 12 months?  no  Does patient tend to be constipated or take laxatives? occassionally   Is patient on Coumadin, Plavix and/or Aspirin? no  Medications: see epiv  Allergies: nkda  Medication Adjustment:   Procedure date & time: 08/18/14 at 1030

## 2014-07-22 NOTE — Telephone Encounter (Signed)
agree

## 2014-08-10 ENCOUNTER — Encounter (HOSPITAL_COMMUNITY): Payer: Self-pay | Admitting: Pharmacy Technician

## 2014-08-17 ENCOUNTER — Other Ambulatory Visit (INDEPENDENT_AMBULATORY_CARE_PROVIDER_SITE_OTHER): Payer: Self-pay | Admitting: *Deleted

## 2014-08-17 DIAGNOSIS — Z1211 Encounter for screening for malignant neoplasm of colon: Secondary | ICD-10-CM

## 2014-08-18 ENCOUNTER — Encounter (HOSPITAL_COMMUNITY)
Admission: RE | Disposition: A | Discharge status: Home / Self Care | Payer: Self-pay | Source: Ambulatory Visit | Attending: Internal Medicine

## 2014-08-18 ENCOUNTER — Ambulatory Visit (HOSPITAL_COMMUNITY)
Admission: RE | Admit: 2014-08-18 | Discharge: 2014-08-18 | Disposition: A | Discharge status: Home / Self Care | Payer: Medicaid Other | Source: Ambulatory Visit | Attending: Internal Medicine | Admitting: Internal Medicine

## 2014-08-18 ENCOUNTER — Encounter (HOSPITAL_COMMUNITY): Payer: Self-pay

## 2014-08-18 DIAGNOSIS — Z791 Long term (current) use of non-steroidal anti-inflammatories (NSAID): Secondary | ICD-10-CM | POA: Diagnosis not present

## 2014-08-18 DIAGNOSIS — Z79899 Other long term (current) drug therapy: Secondary | ICD-10-CM | POA: Diagnosis not present

## 2014-08-18 DIAGNOSIS — K644 Residual hemorrhoidal skin tags: Secondary | ICD-10-CM | POA: Insufficient documentation

## 2014-08-18 DIAGNOSIS — I10 Essential (primary) hypertension: Secondary | ICD-10-CM | POA: Insufficient documentation

## 2014-08-18 DIAGNOSIS — Q438 Other specified congenital malformations of intestine: Secondary | ICD-10-CM | POA: Insufficient documentation

## 2014-08-18 DIAGNOSIS — E785 Hyperlipidemia, unspecified: Secondary | ICD-10-CM | POA: Diagnosis not present

## 2014-08-18 DIAGNOSIS — K2960 Other gastritis without bleeding: Secondary | ICD-10-CM

## 2014-08-18 DIAGNOSIS — R1310 Dysphagia, unspecified: Secondary | ICD-10-CM | POA: Insufficient documentation

## 2014-08-18 DIAGNOSIS — K449 Diaphragmatic hernia without obstruction or gangrene: Secondary | ICD-10-CM

## 2014-08-18 DIAGNOSIS — Z1211 Encounter for screening for malignant neoplasm of colon: Secondary | ICD-10-CM

## 2014-08-18 HISTORY — PX: COLONOSCOPY: SHX5424

## 2014-08-18 HISTORY — PX: MALONEY DILATION: SHX5535

## 2014-08-18 HISTORY — PX: ESOPHAGOGASTRODUODENOSCOPY: SHX5428

## 2014-08-18 SURGERY — COLONOSCOPY
Anesthesia: Moderate Sedation

## 2014-08-18 MED ORDER — SODIUM CHLORIDE 0.9 % IV SOLN
INTRAVENOUS | Status: DC
Start: 2014-08-18 — End: 2014-08-18
  Administered 2014-08-18: 11:00:00 via INTRAVENOUS

## 2014-08-18 MED ORDER — MIDAZOLAM HCL 5 MG/5ML IJ SOLN
INTRAMUSCULAR | Status: DC
Start: 2014-08-18 — End: 2014-08-18
  Filled 2014-08-18: qty 10

## 2014-08-18 MED ORDER — MIDAZOLAM HCL 5 MG/5ML IJ SOLN
INTRAMUSCULAR | Status: DC
Start: 2014-08-18 — End: 2014-08-18
  Filled 2014-08-18: qty 5

## 2014-08-18 MED ORDER — SIMETHICONE 40 MG/0.6ML PO SUSP
ORAL | Status: AC
  Filled 2014-08-18: qty 0.6

## 2014-08-18 MED ORDER — STERILE WATER FOR IRRIGATION IR SOLN
Status: DC | PRN
  Administered 2014-08-18: 13:00:00

## 2014-08-18 MED ORDER — BUTAMBEN-TETRACAINE-BENZOCAINE 2-2-14 % EX AERO
INHALATION_SPRAY | CUTANEOUS | Status: DC | PRN
  Administered 2014-08-18: 2 via TOPICAL

## 2014-08-18 MED ORDER — MIDAZOLAM HCL 5 MG/5ML IJ SOLN
INTRAMUSCULAR | Status: DC | PRN
  Administered 2014-08-18 (×7): 2 mg via INTRAVENOUS

## 2014-08-18 MED ORDER — SIMETHICONE 40 MG/0.6ML PO SUSP
ORAL | Status: AC
  Filled 2014-08-18: qty 1.2

## 2014-08-18 MED ORDER — MEPERIDINE HCL 50 MG/ML IJ SOLN
INTRAMUSCULAR | Status: AC
  Filled 2014-08-18: qty 1

## 2014-08-18 MED ORDER — MEPERIDINE HCL 50 MG/ML IJ SOLN
INTRAMUSCULAR | Status: DC | PRN
  Administered 2014-08-18 (×2): 25 mg via INTRAVENOUS

## 2014-08-18 NOTE — H&P (Signed)
Xavier Livingston is an 58 y.o. male.   Chief Complaint: Patient is here for colonoscopy and EGD(add on). HPI: Patient is 58 year old [place] American male who is here for screening colonoscopy. He denies abdominal pain change in his bowel habits or rectal bleeding. Vision also complains of dysphagia. The symptoms started 2 years ago. This primarily with liquids and at times with solids. He points to the midsternal area site of bolus obstruction. When this occurs he has sharp pain. Patient underwent barium pill esophagogram on 03/24/2014. There was a question of tiny Zenker's diverticulum but no other abnormalities were encountered. Patient has been on pantoprazole for the past few months but cannot tell any difference. He does not experience heartburn often. He denies anorexia or weight loss. He is on Naprosyn which she uses a couple of times a week not every day. Family history is negative for CRC.  Past Medical History  Diagnosis Date  . Hypertension   . Hyperlipemia     Past Surgical History  Procedure Laterality Date  . Nasal septum surgery    . Excision of mass of back    . Mass excision N/A 10/21/2013    Procedure: EXCISION MASSES X 2 ON BACK;  Surgeon: Scherry Ran, MD;  Location: [facility];  Service: General;  Laterality: N/A;    History reviewed. No pertinent family history. Social History:  reports that he has been smoking Cigarettes.  He has been smoking about 0.25 packs per day. He does not have any smokeless tobacco history on file. He reports that he drinks alcohol. He reports that he uses illicit drugs.  Allergies: No Known Allergies  Medications Prior to Admission  Medication Sig Dispense Refill  . amLODipine (NORVASC) 5 MG tablet Take 5 mg by mouth daily.      . naproxen (NAPROSYN) 500 MG tablet Take 500 mg by mouth 2 (two) times daily with a meal.      . pantoprazole (PROTONIX) 40 MG tablet Take 40 mg by mouth daily.      . peg 3350 powder (MOVIPREP) 100 G SOLR  Take 1 kit (200 g total) by mouth once.  1 kit  0  . pravastatin (PRAVACHOL) 40 MG tablet Take 40 mg by mouth daily.      . silodosin (RAPAFLO) 8 MG CAPS capsule Take 8 mg by mouth daily with breakfast.        No results found for this or any previous visit (from the past 48 hour(s)). No results found.  ROS  Blood pressure 167/103, pulse 67, temperature 98.2 F (36.8 C), temperature source Oral, resp. rate 16, height '5\' 11"'  (1.803 m), weight 182 lb (82.555 kg), SpO2 99.00%. Physical Exam  Constitutional: He appears well-developed and well-nourished.  HENT:  Mouth/Throat: Oropharynx is clear and moist.  Eyes: Conjunctivae are normal. No scleral icterus.  Neck: No thyromegaly present.  Cardiovascular: Normal rate, regular rhythm and normal heart sounds.   No murmur heard. Respiratory: Effort normal and breath sounds normal.  GI: Soft. He exhibits no distension and no mass. There is no tenderness.  Musculoskeletal: He exhibits no edema.  Lymphadenopathy:    He has no cervical adenopathy.  Neurological: He is alert.  Skin: Skin is warm and dry.     Assessment/Plan Average risk screening colonoscopy. Dysphagia to liquids and solids. EGD and possible ED.   Harlym Gehling U 08/18/2014, 12:42 PM

## 2014-08-18 NOTE — Discharge Instructions (Signed)
Resume usual medications and diet. No driving for 24 hours. Physician will call with results of blood test.Esophagogastroduodenoscopy Care After Refer to this sheet in the next few weeks. These instructions provide you with information on caring for yourself after your procedure. Your caregiver may also give you more specific instructions. Your treatment has been planned according to current medical practices, but problems sometimes occur. Call your caregiver if you have any problems or questions after your procedure.  HOME CARE INSTRUCTIONS  Do not eat or drink anything until the numbing medicine (local anesthetic) has worn off and your gag reflex has returned. You will know that the local anesthetic has worn off when you can swallow comfortably.  Do not drive for 12 hours after the procedure or as directed by your caregiver.  Only take medicines as directed by your caregiver. SEEK MEDICAL CARE IF:   You cannot stop coughing.  You are not urinating at all or less than usual. SEEK IMMEDIATE MEDICAL CARE IF:  You have difficulty swallowing.  You cannot eat or drink.  You have worsening throat or chest pain.  You have dizziness, lightheadedness, or you faint.  You have nausea or vomiting.  You have chills.  You have a fever.  You have severe abdominal pain.  You have black, tarry, or bloody stools. Document Released: 11/05/2012 Document Reviewed: 11/05/2012 [facility] Patient Information 2015 [place]. This information is not intended to replace advice given to you by your health care provider. Make sure you discuss any questions you have with your health care provider. Colonoscopy, Care After Refer to this sheet in the next few weeks. These instructions provide you with information on caring for yourself after your procedure. Your health care provider may also give you more specific instructions. Your treatment has been planned according to current medical practices, but  problems sometimes occur. Call your health care provider if you have any problems or questions after your procedure. WHAT TO EXPECT AFTER THE PROCEDURE  After your procedure, it is typical to have the following:  A small amount of blood in your stool.  Moderate amounts of gas and mild abdominal cramping or bloating. HOME CARE INSTRUCTIONS  Do not drive, operate machinery, or sign important documents for 24 hours.  You may shower and resume your regular physical activities, but move at a slower pace for the first 24 hours.  Take frequent rest periods for the first 24 hours.  Walk around or put a warm pack on your abdomen to help reduce abdominal cramping and bloating.  Drink enough fluids to keep your urine clear or pale yellow.  You may resume your normal diet as instructed by your health care provider. Avoid heavy or fried foods that are hard to digest.  Avoid drinking alcohol for 24 hours or as instructed by your health care provider.  Only take over-the-counter or prescription medicines as directed by your health care provider.  If a tissue sample (biopsy) was taken during your procedure:  Do not take aspirin or blood thinners for 7 days, or as instructed by your health care provider.  Do not drink alcohol for 7 days, or as instructed by your health care provider.  Eat soft foods for the first 24 hours. SEEK MEDICAL CARE IF: You have persistent spotting of blood in your stool 2-3 days after the procedure. SEEK IMMEDIATE MEDICAL CARE IF:  You have more than a small spotting of blood in your stool.  You pass large blood clots in your stool.  Your abdomen is swollen (distended).  You have nausea or vomiting.  You have a fever.  You have increasing abdominal pain that is not relieved with medicine. Document Released: 07/03/2004 Document Revised: 09/09/2013 Document Reviewed: 07/27/2013 [facility] Patient Information 2015 [place], [place]. This information is not intended  to replace advice given to you by your health care provider. Make sure you discuss any questions you have with your health care provider.

## 2014-08-18 NOTE — Op Note (Signed)
COLONOSCOPY EGD and ED  PROCEDURE REPORT  PATIENT:  Xavier Livingston  MR#:  [id number] Birthdate:  01/09/1956, 58 y.o., male Endoscopist:  Dr. Rogene Houston, MD Referred By:  Dr. Maricela Curet, MD  Procedure Date: 08/18/2014  Procedure:    Colonoscopy followed by EGD and ED.  Indications:  Patient is a 58 year old male who is here for average risk screening colonoscopy. He also complains of worsening dysphagia primarily to liquids but also to solids. He underwent barium pill esophagogram in April of this year and was unremarkable other than question of a small Zenker's diverticulum. He is therefore also undergoing EGD and possible ED.            Informed Consent:  The risks, benefits, alternatives & imponderables which include, but are not limited to, bleeding, infection, perforation, drug reaction and potential missed lesion have been reviewed.  The potential for biopsy, lesion removal, esophageal dilation, etc. have also been discussed.  Questions have been answered.  All parties agreeable.  Please see history & physical in medical record for more information.  Medications:  Demerol 50 mg IV Versed 14 mg IV Cetacaine spray topically for oropharyngeal anesthesia    COLONOSCOPY Description of procedure:  After a digital rectal exam was performed, that colonoscope was advanced from the anus through the rectum and colon to the area of the cecum, ileocecal valve and appendiceal orifice. The cecum was deeply intubated. These structures were well-seen and photographed for the record. From the level of the cecum and ileocecal valve, the scope was slowly and cautiously withdrawn. The mucosal surfaces were carefully surveyed utilizing scope tip to flexion to facilitate fold flattening as needed. The scope was pulled down into the rectum where a thorough exam including retroflexion was performed.  Findings:   Prep satisfactory. Redundant colon but normal mucosa throughout. Normal rectal  mucosa. Small hemorrhoids below the dentate line.  Therapeutic/Diagnostic Maneuvers Performed:  None  Complications:  None  Cecal Withdrawal Time:  9 minutes  EGD  Description of procedure:  The endoscope was introduced through the mouth and advanced to the second portion of the duodenum without difficulty or limitations. The mucosal surfaces were surveyed very carefully during advancement of the scope and upon withdrawal.  Findings:  Esophagus:  Normal mucosa of the esophagus. GE junction without ring or stricture formation. No diverticulum noted in proximal esophagus. GEJ:  41 cm Hiatus:  43 cm Stomach:  Stomach was empty and distended very well with insufflation. Folds in the proximal stomach was normal. Examination of the mucosa the gastric body was normal. Patchy prepyloric erythema and edema noted. Pyloric channel was patent. Angularis fundus and cardia were unremarkable. Duodenum:  Normal bulbar and post bulbar mucosa.  Therapeutic/Diagnostic Maneuvers Performed:   Esophagus was dilated by passing  56 [place] Maloney dilator to full insertion. As the dilator was withdrawn  endoscope was passed again and no mucosal disruption noted to esophagus.  Impression:  Colonoscopy findings; External hemorrhoids otherwise normal colonoscopy.  EGD findings; Small sliding hiatal hernia without ring or stricture formation. Nonerosive antral gastritis. Esophagus dilated by passing 56 [place] Maloney dilator but no mucosal disruption induced.  Recommendations:  H. pylori serology. If patient remains with dysphagia will proceed with esophageal manometry and impedance study. Next screening exam in 10 years.  REHMAN,NAJEEB U  08/18/2014 1:22 PM  CC: Dr. Maricela Curet, MD & Dr. Rayne Du ref. provider found

## 2014-08-19 LAB — H. PYLORI ANTIBODY, IGG: H Pylori IgG: 0.78 {ISR}

## 2014-08-20 ENCOUNTER — Encounter (HOSPITAL_COMMUNITY): Payer: Self-pay | Admitting: Internal Medicine

## 2014-09-03 ENCOUNTER — Telehealth (INDEPENDENT_AMBULATORY_CARE_PROVIDER_SITE_OTHER): Payer: Self-pay | Admitting: *Deleted

## 2014-09-03 NOTE — Telephone Encounter (Signed)
Patient is sch'd for esophageal manometry with impedence study on 09/27/14 @ 1030 @ [facility], patient aware

## 2014-11-22 ENCOUNTER — Emergency Department (HOSPITAL_COMMUNITY)
Admission: EM | Admit: 2014-11-22 | Discharge: 2014-11-22 | Disposition: A | Discharge status: Home / Self Care | Payer: Medicaid Other | Attending: Emergency Medicine | Admitting: Emergency Medicine

## 2014-11-22 ENCOUNTER — Encounter (HOSPITAL_COMMUNITY): Payer: Self-pay | Admitting: Emergency Medicine

## 2014-11-22 DIAGNOSIS — Y9389 Activity, other specified: Secondary | ICD-10-CM | POA: Diagnosis not present

## 2014-11-22 DIAGNOSIS — Y998 Other external cause status: Secondary | ICD-10-CM | POA: Insufficient documentation

## 2014-11-22 DIAGNOSIS — T148XXA Other injury of unspecified body region, initial encounter: Secondary | ICD-10-CM

## 2014-11-22 DIAGNOSIS — Z9889 Other specified postprocedural states: Secondary | ICD-10-CM | POA: Diagnosis not present

## 2014-11-22 DIAGNOSIS — E785 Hyperlipidemia, unspecified: Secondary | ICD-10-CM | POA: Insufficient documentation

## 2014-11-22 DIAGNOSIS — Y92410 Unspecified street and highway as the place of occurrence of the external cause: Secondary | ICD-10-CM | POA: Insufficient documentation

## 2014-11-22 DIAGNOSIS — S29012A Strain of muscle and tendon of back wall of thorax, initial encounter: Secondary | ICD-10-CM | POA: Insufficient documentation

## 2014-11-22 DIAGNOSIS — I10 Essential (primary) hypertension: Secondary | ICD-10-CM | POA: Diagnosis not present

## 2014-11-22 DIAGNOSIS — S00511A Abrasion of lip, initial encounter: Secondary | ICD-10-CM | POA: Insufficient documentation

## 2014-11-22 DIAGNOSIS — S3992XA Unspecified injury of lower back, initial encounter: Secondary | ICD-10-CM | POA: Diagnosis present

## 2014-11-22 DIAGNOSIS — Z87891 Personal history of nicotine dependence: Secondary | ICD-10-CM | POA: Insufficient documentation

## 2014-11-22 DIAGNOSIS — Z79899 Other long term (current) drug therapy: Secondary | ICD-10-CM | POA: Diagnosis not present

## 2014-11-22 MED ORDER — DICLOFENAC SODIUM 75 MG PO TBEC: 75.0000 mg | DELAYED_RELEASE_TABLET | Freq: Two times a day (BID) | ORAL | Status: DC

## 2014-11-22 MED ORDER — METHOCARBAMOL 500 MG PO TABS: 500.0000 mg | ORAL_TABLET | Freq: Three times a day (TID) | ORAL | Status: DC
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
## 2015-01-10 ENCOUNTER — Other Ambulatory Visit (HOSPITAL_COMMUNITY): Payer: Self-pay | Admitting: Family Medicine

## 2015-01-10 DIAGNOSIS — M5441 Lumbago with sciatica, right side: Secondary | ICD-10-CM

## 2015-01-11 ENCOUNTER — Encounter (HOSPITAL_COMMUNITY): Payer: Self-pay

## 2015-01-11 ENCOUNTER — Ambulatory Visit (HOSPITAL_COMMUNITY)
Admission: RE | Admit: 2015-01-11 | Discharge: 2015-01-11 | Disposition: A | Discharge status: Home / Self Care | Payer: Medicaid Other | Source: Ambulatory Visit | Attending: Family Medicine | Admitting: Family Medicine

## 2015-01-11 DIAGNOSIS — M5441 Lumbago with sciatica, right side: Secondary | ICD-10-CM

## 2015-02-08 DIAGNOSIS — G959 Disease of spinal cord, unspecified: Secondary | ICD-10-CM | POA: Insufficient documentation

## 2015-03-08 DIAGNOSIS — M5030 Other cervical disc degeneration, unspecified cervical region: Secondary | ICD-10-CM | POA: Insufficient documentation

## 2015-03-29 ENCOUNTER — Other Ambulatory Visit: Payer: Self-pay | Admitting: Neurosurgery

## 2015-04-07 ENCOUNTER — Encounter (HOSPITAL_COMMUNITY)
Admission: RE | Admit: 2015-04-07 | Discharge: 2015-04-07 | Disposition: A | Discharge status: Home / Self Care | Payer: Medicaid Other | Source: Ambulatory Visit | Attending: Neurosurgery | Admitting: Neurosurgery

## 2015-04-07 ENCOUNTER — Encounter (HOSPITAL_COMMUNITY): Payer: Self-pay

## 2015-04-07 HISTORY — DX: Unspecified osteoarthritis, unspecified site: M19.90

## 2015-04-07 HISTORY — DX: Headache, unspecified: R51.9

## 2015-04-07 HISTORY — DX: Personal history of nicotine dependence: Z87.891

## 2015-04-07 HISTORY — DX: Nocturia: R35.1

## 2015-04-07 HISTORY — DX: Benign prostatic hyperplasia without lower urinary tract symptoms: N40.0

## 2015-04-07 HISTORY — DX: Gastro-esophageal reflux disease without esophagitis: K21.9

## 2015-04-07 HISTORY — DX: Headache: R51

## 2015-04-07 HISTORY — DX: Cardiac arrhythmia, unspecified: I49.9

## 2015-04-07 LAB — COMPREHENSIVE METABOLIC PANEL
ALT: 12 U/L — ABNORMAL LOW (ref 17–63)
AST: 22 U/L (ref 15–41)
Albumin: 4 g/dL (ref 3.5–5.0)
Alkaline Phosphatase: 98 U/L (ref 38–126)
Anion gap: 10 (ref 5–15)
BILIRUBIN TOTAL: 0.3 mg/dL (ref 0.3–1.2)
BUN: 9 mg/dL (ref 6–20)
CALCIUM: 9.3 mg/dL (ref 8.9–10.3)
CHLORIDE: 104 mmol/L (ref 101–111)
CO2: 26 mmol/L (ref 22–32)
CREATININE: 0.92 mg/dL (ref 0.61–1.24)
GLUCOSE: 85 mg/dL (ref 70–99)
Potassium: 4.2 mmol/L (ref 3.5–5.1)
Sodium: 140 mmol/L (ref 135–145)
Total Protein: 7.4 g/dL (ref 6.5–8.1)

## 2015-04-07 LAB — CBC
HCT: 47.7 % (ref 39.0–52.0)
Hemoglobin: 16.2 g/dL (ref 13.0–17.0)
MCH: 31.6 pg (ref 26.0–34.0)
MCHC: 34 g/dL (ref 30.0–36.0)
MCV: 93.2 fL (ref 78.0–100.0)
PLATELETS: 349 10*3/uL (ref 150–400)
RBC: 5.12 MIL/uL (ref 4.22–5.81)
RDW: 14.3 % (ref 11.5–15.5)
WBC: 8.2 10*3/uL (ref 4.0–10.5)

## 2015-04-07 LAB — SURGICAL PCR SCREEN
MRSA, PCR: POSITIVE — AB
Staphylococcus aureus: POSITIVE — AB

## 2015-04-07 NOTE — Progress Notes (Signed)
   04/07/15 1303  OBSTRUCTIVE SLEEP APNEA  Have you ever been diagnosed with sleep apnea through a sleep study? No  Do you snore loudly (loud enough to be heard through closed doors)?  0  Do you often feel tired, fatigued, or sleepy during the daytime? 0  Has anyone observed you stop breathing during your sleep? 0  Do you have, or are you being treated for high blood pressure? 1  BMI more than 35 kg/m2? 0  Age over 60 years old? 1  Neck circumference greater than 40 cm/16 inches? 0  Gender: 1

## 2015-04-07 NOTE — Pre-Procedure Instructions (Signed)
GIOVANNE NICKOLSON  04/07/2015   Your procedure is scheduled on:  Monday, Apr 11, 2015  Report to [facility] Admitting at 5:30 AM.  Call this number if you have problems the morning of surgery: [phone]   Remember:   Do not eat food or drink liquids after midnight Sunday, Apr 10, 2015   Take these medicines the morning of surgery with A SIP OF WATER: amLODipine (NORVASC),  pantoprazole (PROTONIX), silodosin (RAPAFLO), if needed: albuterol (PROAIR HFA)  inhaler for shortness of breath/wheezing ( Bring inhaler in on day of procedure).  Stop taking Aspirin, vitamins and herbal medications. Do not take any NSAIDs ie: Ibuprofen, Advil, Naproxen or any medication containing Aspirin; stop now.   Do not wear jewelry, make-up or nail polish.  Do not wear lotions, powders, or perfumes. You may not wear deodorant.  Do not shave 48 hours prior to surgery. Men may shave face and neck.  Do not bring valuables to the hospital.  [facility] is not responsible for any belongings or valuables.               Contacts, dentures or bridgework may not be worn into surgery.  Leave suitcase in the car. After surgery it may be brought to your room.  For patients admitted to the hospital, discharge time is determined by your treatment team.                  Special Instructions:  [place] - Preparing for Surgery  Before surgery, you can play an important role.  Because skin is not sterile, your skin needs to be as free of germs as possible.  You can reduce the number of germs on you skin by washing with CHG (chlorahexidine gluconate) soap before surgery.  CHG is an antiseptic cleaner which kills germs and bonds with the skin to continue killing germs even after washing.  Please DO NOT use if you have an allergy to CHG or antibacterial soaps.  If your skin becomes reddened/irritated stop using the CHG and inform your nurse when you arrive at Short Stay.  Do not shave (including legs and  underarms) for at least 48 hours prior to the first CHG shower.  You may shave your face.  Please follow these instructions carefully:   1.  Shower with CHG Soap the night before surgery and the morning of Surgery.  2.  If you choose to wash your hair, wash your hair first as usual with your normal shampoo.  3.  After you shampoo, rinse your hair and body thoroughly to remove the Shampoo.  4.  Use CHG as you would any other liquid soap.  You can apply chg directly  to the skin and wash gently with scrungie or a clean washcloth.  5.  Apply the CHG Soap to your body ONLY FROM THE NECK DOWN.  Do not use on open wounds or open sores.  Avoid contact with your eyes, ears, mouth and genitals (private parts).  Wash genitals (private parts) with your normal soap.  6.  Wash thoroughly, paying special attention to the area where your surgery will be performed.  7.  Thoroughly rinse your body with warm water from the neck down.  8.  DO NOT shower/wash with your normal soap after using and rinsing off the CHG Soap.  9.  Pat yourself dry with a clean towel.            10 .  Wear clean pajamas.  11.  Place clean sheets on your bed the night of your first shower and do not sleep with pets.  Day of Surgery  Do not apply any lotions/deodorants the morning of surgery.  Please wear clean clothes to the hospital/surgery center.   Please read over the following fact sheets that you were given: Pain Booklet, Coughing and Deep Breathing, MRSA Information and Surgical Site Infection Prevention

## 2015-04-08 ENCOUNTER — Encounter (HOSPITAL_COMMUNITY): Payer: Self-pay

## 2015-04-08 NOTE — Progress Notes (Signed)
Anesthesia Chart Review:  Pt is 59 year old male scheduled for C3-4, C4-5, C5-6, C6-7 ACDF on 04/11/2015 with Dr. Saintclair Halsted.   PMH includes: HTN, hyperlipidemia, dysrhythmia (pt reports irregular heartbeat in the 80's), GERD. Former smoker (quit one month ago). BMI 25. S/p excision of masses x2 on back 10/21/13.   Preoperative labs reviewed.    EKG: Sinus rhythm with 1st degree A-V block. Minimal voltage criteria for LVH, may be normal variant. Appears unchanged from 10/20/2013 tracing.   If no changes, I anticipate pt can proceed with surgery as scheduled.   Willeen Cass, FNP-BC [facility]/Anesthesiology Phone: [phone] 04/08/2015 1:42 PM

## 2015-04-10 NOTE — Anesthesia Preprocedure Evaluation (Addendum)
Anesthesia Evaluation  Patient identified by MRN, date of birth, ID band Patient awake    Reviewed: Allergy & Precautions, NPO status , Patient's Chart, lab work & pertinent test results  History of Anesthesia Complications Negative for: history of anesthetic complications  Airway Mallampati: II  TM Distance: >3 FB Neck ROM: Full    Dental no notable dental hx. (+) Edentulous Upper, Edentulous Lower, Dental Advisory Given   Pulmonary former smoker,  breath sounds clear to auscultation  Pulmonary exam normal       Cardiovascular Exercise Tolerance: Good hypertension, Pt. on medications Normal cardiovascular exam+ dysrhythmias Rhythm:Regular Rate:Normal     Neuro/Psych  Headaches, Reports some intermittent numbness/tingling in left forearm and hand with neck extension and flexion, also reports intermittent numbness in left lower lateral leg to foot negative psych ROS   GI/Hepatic negative GI ROS, Neg liver ROS, GERD-  Medicated and Controlled,  Endo/Other  negative endocrine ROS  Renal/GU negative Renal ROS  negative genitourinary   Musculoskeletal  (+) Arthritis -, Osteoarthritis,    Abdominal   Peds negative pediatric ROS (+)  Hematology negative hematology ROS (+)   Anesthesia Other Findings   Reproductive/Obstetrics negative OB ROS                            Anesthesia Physical Anesthesia Plan  ASA: II  Anesthesia Plan: General   Post-op Pain Management:    Induction: Intravenous  Airway Management Planned: Oral ETT  Additional Equipment:   Intra-op Plan:   Post-operative Plan: Extubation in OR  Informed Consent: I have reviewed the patients History and Physical, chart, labs and discussed the procedure including the risks, benefits and alternatives for the proposed anesthesia with the patient or authorized representative who has indicated his/her understanding and  acceptance.   Dental advisory given  Plan Discussed with: CRNA  Anesthesia Plan Comments:         Anesthesia Quick Evaluation

## 2015-04-11 ENCOUNTER — Inpatient Hospital Stay (HOSPITAL_COMMUNITY): Payer: Medicaid Other

## 2015-04-11 ENCOUNTER — Inpatient Hospital Stay (HOSPITAL_COMMUNITY)
Admission: RE | Admit: 2015-04-11 | Discharge: 2015-04-13 | DRG: 473 | Disposition: A | Discharge status: Home / Self Care | Payer: Medicaid Other | Source: Ambulatory Visit | Attending: Neurosurgery | Admitting: Neurosurgery

## 2015-04-11 ENCOUNTER — Encounter (HOSPITAL_COMMUNITY)
Admission: RE | Disposition: A | Discharge status: Home / Self Care | Payer: Self-pay | Source: Ambulatory Visit | Attending: Neurosurgery

## 2015-04-11 ENCOUNTER — Encounter (HOSPITAL_COMMUNITY): Payer: Self-pay | Admitting: *Deleted

## 2015-04-11 ENCOUNTER — Inpatient Hospital Stay (HOSPITAL_COMMUNITY): Payer: Medicaid Other | Admitting: Anesthesiology

## 2015-04-11 ENCOUNTER — Inpatient Hospital Stay (HOSPITAL_COMMUNITY): Payer: Medicaid Other | Admitting: Emergency Medicine

## 2015-04-11 DIAGNOSIS — Z79899 Other long term (current) drug therapy: Secondary | ICD-10-CM | POA: Diagnosis not present

## 2015-04-11 DIAGNOSIS — M4802 Spinal stenosis, cervical region: Secondary | ICD-10-CM | POA: Diagnosis present

## 2015-04-11 DIAGNOSIS — K219 Gastro-esophageal reflux disease without esophagitis: Secondary | ICD-10-CM | POA: Diagnosis present

## 2015-04-11 DIAGNOSIS — I10 Essential (primary) hypertension: Secondary | ICD-10-CM | POA: Diagnosis present

## 2015-04-11 DIAGNOSIS — M4712 Other spondylosis with myelopathy, cervical region: Principal | ICD-10-CM | POA: Diagnosis present

## 2015-04-11 DIAGNOSIS — M542 Cervicalgia: Secondary | ICD-10-CM | POA: Diagnosis present

## 2015-04-11 DIAGNOSIS — R1319 Other dysphagia: Secondary | ICD-10-CM | POA: Diagnosis not present

## 2015-04-11 DIAGNOSIS — E785 Hyperlipidemia, unspecified: Secondary | ICD-10-CM | POA: Diagnosis present

## 2015-04-11 DIAGNOSIS — Z419 Encounter for procedure for purposes other than remedying health state, unspecified: Secondary | ICD-10-CM

## 2015-04-11 DIAGNOSIS — Z87891 Personal history of nicotine dependence: Secondary | ICD-10-CM | POA: Diagnosis not present

## 2015-04-11 DIAGNOSIS — Z7951 Long term (current) use of inhaled steroids: Secondary | ICD-10-CM | POA: Diagnosis not present

## 2015-04-11 HISTORY — PX: ANTERIOR CERVICAL DECOMPRESSION/DISCECTOMY FUSION 4 LEVELS: SHX5556

## 2015-04-11 SURGERY — ANTERIOR CERVICAL DECOMPRESSION/DISCECTOMY FUSION 4 LEVELS
Anesthesia: General | Site: Neck

## 2015-04-11 MED ORDER — LABETALOL HCL 5 MG/ML IV SOLN: 5.0000 mg | Freq: Once | INTRAVENOUS | Status: DC

## 2015-04-11 MED ORDER — ALUM & MAG HYDROXIDE-SIMETH 200-200-20 MG/5ML PO SUSP: 30.0000 mL | Freq: Four times a day (QID) | ORAL | Status: DC | PRN

## 2015-04-11 MED ORDER — ONDANSETRON HCL 4 MG/2ML IJ SOLN
INTRAMUSCULAR | Status: DC | PRN
  Administered 2015-04-11: 4 mg via INTRAVENOUS

## 2015-04-11 MED ORDER — FENTANYL CITRATE (PF) 100 MCG/2ML IJ SOLN
INTRAMUSCULAR | Status: AC
  Filled 2015-04-11: qty 2

## 2015-04-11 MED ORDER — THROMBIN 5000 UNITS EX SOLR
OROMUCOSAL | Status: DC | PRN
  Administered 2015-04-11 (×2): via TOPICAL

## 2015-04-11 MED ORDER — FENTANYL CITRATE (PF) 100 MCG/2ML IJ SOLN
INTRAMUSCULAR | Status: DC | PRN
  Administered 2015-04-11 (×5): 50 ug via INTRAVENOUS
  Administered 2015-04-11: 100 ug via INTRAVENOUS
  Administered 2015-04-11 (×3): 50 ug via INTRAVENOUS

## 2015-04-11 MED ORDER — PRAVASTATIN SODIUM 40 MG PO TABS
40.0000 mg | ORAL_TABLET | Freq: Every day | ORAL | Status: DC
  Administered 2015-04-12 – 2015-04-13 (×2): 40 mg via ORAL
  Filled 2015-04-11 (×2): qty 1

## 2015-04-11 MED ORDER — MENTHOL 3 MG MT LOZG: 1.0000 | LOZENGE | OROMUCOSAL | Status: DC | PRN

## 2015-04-11 MED ORDER — LIDOCAINE HCL (CARDIAC) 20 MG/ML IV SOLN
INTRAVENOUS | Status: AC
  Filled 2015-04-11: qty 5

## 2015-04-11 MED ORDER — HYDRALAZINE HCL 20 MG/ML IJ SOLN
INTRAMUSCULAR | Status: AC
  Administered 2015-04-11: 5 mg
  Filled 2015-04-11: qty 1

## 2015-04-11 MED ORDER — ONDANSETRON HCL 4 MG/2ML IJ SOLN: 4.0000 mg | INTRAMUSCULAR | Status: DC | PRN

## 2015-04-11 MED ORDER — VECURONIUM BROMIDE 10 MG IV SOLR
INTRAVENOUS | Status: DC | PRN
  Administered 2015-04-11: 1 mg via INTRAVENOUS
  Administered 2015-04-11 (×3): 2 mg via INTRAVENOUS
  Administered 2015-04-11: 1 mg via INTRAVENOUS

## 2015-04-11 MED ORDER — ACETAMINOPHEN 10 MG/ML IV SOLN
1000.0000 mg | Freq: Once | INTRAVENOUS | Status: AC
Start: 2015-04-11 — End: 2015-04-11
  Administered 2015-04-11: 1000 mg via INTRAVENOUS

## 2015-04-11 MED ORDER — ACETAMINOPHEN 10 MG/ML IV SOLN
INTRAVENOUS | Status: AC
  Filled 2015-04-11: qty 100

## 2015-04-11 MED ORDER — PHENYLEPHRINE HCL 10 MG/ML IJ SOLN
10.0000 mg | INTRAVENOUS | Status: DC | PRN
  Administered 2015-04-11: 15 ug/min via INTRAVENOUS

## 2015-04-11 MED ORDER — BACITRACIN 50000 UNITS IM SOLR
INTRAMUSCULAR | Status: DC | PRN
  Administered 2015-04-11: 09:00:00

## 2015-04-11 MED ORDER — LIDOCAINE HCL (CARDIAC) 20 MG/ML IV SOLN
INTRAVENOUS | Status: DC | PRN
  Administered 2015-04-11: 20 mg via INTRAVENOUS

## 2015-04-11 MED ORDER — FENTANYL CITRATE (PF) 250 MCG/5ML IJ SOLN
INTRAMUSCULAR | Status: AC
  Filled 2015-04-11: qty 5

## 2015-04-11 MED ORDER — PHENYLEPHRINE HCL 10 MG/ML IJ SOLN
INTRAMUSCULAR | Status: DC | PRN
  Administered 2015-04-11 (×3): 80 ug via INTRAVENOUS

## 2015-04-11 MED ORDER — LABETALOL HCL 5 MG/ML IV SOLN
INTRAVENOUS | Status: AC
  Filled 2015-04-11: qty 4

## 2015-04-11 MED ORDER — 0.9 % SODIUM CHLORIDE (POUR BTL) OPTIME
TOPICAL | Status: DC | PRN
  Administered 2015-04-11: 1000 mL

## 2015-04-11 MED ORDER — THROMBIN 20000 UNITS EX SOLR
CUTANEOUS | Status: DC | PRN
  Administered 2015-04-11: 09:00:00 via TOPICAL

## 2015-04-11 MED ORDER — MUPIROCIN 2 % EX OINT
1.0000 "application " | TOPICAL_OINTMENT | Freq: Two times a day (BID) | CUTANEOUS | Status: AC
  Administered 2015-04-11: 1 via NASAL

## 2015-04-11 MED ORDER — TAMSULOSIN HCL 0.4 MG PO CAPS
0.4000 mg | ORAL_CAPSULE | Freq: Every morning | ORAL | Status: DC
  Administered 2015-04-11 – 2015-04-13 (×3): 0.4 mg via ORAL
  Filled 2015-04-11 (×3): qty 1

## 2015-04-11 MED ORDER — LIDOCAINE HCL 4 % MT SOLN
OROMUCOSAL | Status: DC | PRN
  Administered 2015-04-11: 4 mL via TOPICAL

## 2015-04-11 MED ORDER — SODIUM CHLORIDE 0.9 % IJ SOLN: 3.0000 mL | INTRAMUSCULAR | Status: DC | PRN

## 2015-04-11 MED ORDER — MIDAZOLAM HCL 5 MG/5ML IJ SOLN
INTRAMUSCULAR | Status: DC | PRN
  Administered 2015-04-11: 2 mg via INTRAVENOUS

## 2015-04-11 MED ORDER — GLYCOPYRROLATE 0.2 MG/ML IJ SOLN
INTRAMUSCULAR | Status: DC | PRN
  Administered 2015-04-11: .6 mg via INTRAVENOUS

## 2015-04-11 MED ORDER — FENTANYL CITRATE (PF) 100 MCG/2ML IJ SOLN
25.0000 ug | INTRAMUSCULAR | Status: DC | PRN
  Administered 2015-04-11 (×3): 50 ug via INTRAVENOUS

## 2015-04-11 MED ORDER — NEOSTIGMINE METHYLSULFATE 10 MG/10ML IV SOLN
INTRAVENOUS | Status: DC | PRN
  Administered 2015-04-11: 4 mg via INTRAVENOUS

## 2015-04-11 MED ORDER — ACETAMINOPHEN 325 MG PO TABS: 650.0000 mg | ORAL_TABLET | ORAL | Status: DC | PRN

## 2015-04-11 MED ORDER — CYCLOBENZAPRINE HCL 10 MG PO TABS
10.0000 mg | ORAL_TABLET | Freq: Three times a day (TID) | ORAL | Status: DC | PRN
  Administered 2015-04-11 – 2015-04-13 (×4): 10 mg via ORAL
  Filled 2015-04-11 (×4): qty 1

## 2015-04-11 MED ORDER — ONDANSETRON HCL 4 MG/2ML IJ SOLN
4.0000 mg | Freq: Once | INTRAMUSCULAR | Status: DC | PRN
Start: 2015-04-11 — End: 2015-04-11

## 2015-04-11 MED ORDER — PHENOL 1.4 % MT LIQD
1.0000 | OROMUCOSAL | Status: DC | PRN
  Administered 2015-04-11: 1 via OROMUCOSAL
  Filled 2015-04-11: qty 177

## 2015-04-11 MED ORDER — HYDROCODONE-ACETAMINOPHEN 5-325 MG PO TABS: 1.0000 | ORAL_TABLET | Freq: Four times a day (QID) | ORAL | Status: DC | PRN

## 2015-04-11 MED ORDER — OXYCODONE-ACETAMINOPHEN 5-325 MG PO TABS: 1.0000 | ORAL_TABLET | Freq: Three times a day (TID) | ORAL | Status: DC | PRN

## 2015-04-11 MED ORDER — HYDROMORPHONE HCL 1 MG/ML IJ SOLN
0.5000 mg | INTRAMUSCULAR | Status: DC | PRN
  Administered 2015-04-11: 1 mg via INTRAVENOUS
  Filled 2015-04-11: qty 1

## 2015-04-11 MED ORDER — MIDAZOLAM HCL 2 MG/2ML IJ SOLN
INTRAMUSCULAR | Status: AC
  Filled 2015-04-11: qty 2

## 2015-04-11 MED ORDER — DEXAMETHASONE 4 MG PO TABS
4.0000 mg | ORAL_TABLET | Freq: Four times a day (QID) | ORAL | Status: DC
  Administered 2015-04-11 – 2015-04-13 (×7): 4 mg via ORAL
  Filled 2015-04-11 (×11): qty 1

## 2015-04-11 MED ORDER — PROPOFOL 10 MG/ML IV BOLUS
INTRAVENOUS | Status: AC
  Filled 2015-04-11: qty 20

## 2015-04-11 MED ORDER — ROCURONIUM BROMIDE 100 MG/10ML IV SOLN
INTRAVENOUS | Status: DC | PRN
  Administered 2015-04-11: 50 mg via INTRAVENOUS

## 2015-04-11 MED ORDER — PROPOFOL 10 MG/ML IV BOLUS
INTRAVENOUS | Status: DC | PRN
  Administered 2015-04-11: 180 mg via INTRAVENOUS

## 2015-04-11 MED ORDER — LABETALOL HCL 5 MG/ML IV SOLN
5.0000 mg | INTRAVENOUS | Status: DC | PRN
  Administered 2015-04-11 (×2): 5 mg via INTRAVENOUS

## 2015-04-11 MED ORDER — PANTOPRAZOLE SODIUM 40 MG PO TBEC
40.0000 mg | DELAYED_RELEASE_TABLET | Freq: Every day | ORAL | Status: DC
  Administered 2015-04-12 – 2015-04-13 (×2): 40 mg via ORAL
  Filled 2015-04-11 (×2): qty 1

## 2015-04-11 MED ORDER — ACETAMINOPHEN 650 MG RE SUPP: 650.0000 mg | RECTAL | Status: DC | PRN

## 2015-04-11 MED ORDER — ALBUTEROL SULFATE (2.5 MG/3ML) 0.083% IN NEBU
3.0000 mL | INHALATION_SOLUTION | RESPIRATORY_TRACT | Status: DC | PRN
Start: 2015-04-11 — End: 2015-04-13

## 2015-04-11 MED ORDER — CEFAZOLIN SODIUM 1-5 GM-% IV SOLN
1.0000 g | Freq: Three times a day (TID) | INTRAVENOUS | Status: AC
  Administered 2015-04-11 (×2): 1 g via INTRAVENOUS
  Filled 2015-04-11 (×2): qty 50

## 2015-04-11 MED ORDER — ONDANSETRON HCL 4 MG/2ML IJ SOLN
INTRAMUSCULAR | Status: AC
  Filled 2015-04-11: qty 2

## 2015-04-11 MED ORDER — OXYCODONE-ACETAMINOPHEN 10-500 MG PO TABS: 1.0000 | ORAL_TABLET | Freq: Three times a day (TID) | ORAL | Status: DC | PRN

## 2015-04-11 MED ORDER — LACTATED RINGERS IV SOLN
INTRAVENOUS | Status: DC | PRN
  Administered 2015-04-11 (×2): via INTRAVENOUS

## 2015-04-11 MED ORDER — ROCURONIUM BROMIDE 50 MG/5ML IV SOLN
INTRAVENOUS | Status: AC
  Filled 2015-04-11: qty 1

## 2015-04-11 MED ORDER — AMLODIPINE BESYLATE 5 MG PO TABS
5.0000 mg | ORAL_TABLET | Freq: Every day | ORAL | Status: DC
  Administered 2015-04-12 – 2015-04-13 (×2): 5 mg via ORAL
  Filled 2015-04-11 (×2): qty 1

## 2015-04-11 MED ORDER — OXYCODONE HCL 5 MG PO TABS: 5.0000 mg | ORAL_TABLET | Freq: Three times a day (TID) | ORAL | Status: DC | PRN

## 2015-04-11 MED ORDER — OXYCODONE-ACETAMINOPHEN 5-325 MG PO TABS
1.0000 | ORAL_TABLET | ORAL | Status: DC | PRN
  Administered 2015-04-11 – 2015-04-13 (×8): 2 via ORAL
  Filled 2015-04-11 (×8): qty 2

## 2015-04-11 MED ORDER — DEXAMETHASONE SODIUM PHOSPHATE 4 MG/ML IJ SOLN
INTRAMUSCULAR | Status: DC | PRN
  Administered 2015-04-11: 10 mg via INTRAVENOUS

## 2015-04-11 MED ORDER — DEXAMETHASONE SODIUM PHOSPHATE 4 MG/ML IJ SOLN
4.0000 mg | Freq: Four times a day (QID) | INTRAMUSCULAR | Status: DC
  Administered 2015-04-11: 4 mg via INTRAVENOUS
  Filled 2015-04-11 (×9): qty 1

## 2015-04-11 MED ORDER — CEFAZOLIN SODIUM-DEXTROSE 2-3 GM-% IV SOLR
INTRAVENOUS | Status: DC | PRN
  Administered 2015-04-11: 2 g via INTRAVENOUS

## 2015-04-11 MED ORDER — SODIUM CHLORIDE 0.9 % IJ SOLN
3.0000 mL | Freq: Two times a day (BID) | INTRAMUSCULAR | Status: DC
  Administered 2015-04-11: 3 mL via INTRAVENOUS

## 2015-04-11 SURGICAL SUPPLY — 85 items
BAG DECANTER FOR FLEXI CONT (MISCELLANEOUS) ×2 IMPLANT
BENZOIN TINCTURE PRP APPL 2/3 (GAUZE/BANDAGES/DRESSINGS) ×2 IMPLANT
BIT DRILL 13 (BIT) ×2 IMPLANT
BRUSH SCRUB EZ PLAIN DRY (MISCELLANEOUS) ×2 IMPLANT
BUR MATCHSTICK NEURO 3.0 LAGG (BURR) ×2 IMPLANT
CANISTER SUCT 3000ML PPV (MISCELLANEOUS) ×2 IMPLANT
CONT SPEC 4OZ CLIKSEAL STRL BL (MISCELLANEOUS) ×2 IMPLANT
COVER MAYO STAND STRL (DRAPES) ×2 IMPLANT
DECANTER SPIKE VIAL GLASS SM (MISCELLANEOUS) IMPLANT
DRAIN SNY WOU 7FLT (WOUND CARE) ×2 IMPLANT
DRAPE C-ARM 42X72 X-RAY (DRAPES) ×4 IMPLANT
DRAPE LAPAROTOMY 100X72 PEDS (DRAPES) ×2 IMPLANT
DRAPE MICROSCOPE LEICA (MISCELLANEOUS) ×2 IMPLANT
DRAPE POUCH INSTRU U-SHP 10X18 (DRAPES) ×2 IMPLANT
DRSG OPSITE 4X5.5 SM (GAUZE/BANDAGES/DRESSINGS) IMPLANT
DRSG OPSITE POSTOP 4X6 (GAUZE/BANDAGES/DRESSINGS) ×2 IMPLANT
DURAPREP 6ML APPLICATOR 50/CS (WOUND CARE) IMPLANT
ELECT COATED BLADE 2.86 ST (ELECTRODE) ×2 IMPLANT
ELECT REM PT RETURN 9FT ADLT (ELECTROSURGICAL) ×2
ELECTRODE REM PT RTRN 9FT ADLT (ELECTROSURGICAL) ×1 IMPLANT
EVACUATOR SILICONE 100CC (DRAIN) ×2 IMPLANT
GAUZE SPONGE 4X4 12PLY STRL (GAUZE/BANDAGES/DRESSINGS) IMPLANT
GAUZE SPONGE 4X4 16PLY XRAY LF (GAUZE/BANDAGES/DRESSINGS) ×4 IMPLANT
GLOVE BIO SURGEON STRL SZ 6.5 (GLOVE) IMPLANT
GLOVE BIO SURGEON STRL SZ7 (GLOVE) IMPLANT
GLOVE BIO SURGEON STRL SZ7.5 (GLOVE) IMPLANT
GLOVE BIO SURGEON STRL SZ8 (GLOVE) ×2 IMPLANT
GLOVE BIO SURGEON STRL SZ8.5 (GLOVE) IMPLANT
GLOVE BIOGEL M 8.0 STRL (GLOVE) IMPLANT
GLOVE BIOGEL PI IND STRL 7.5 (GLOVE) ×2 IMPLANT
GLOVE BIOGEL PI INDICATOR 7.5 (GLOVE) ×2
GLOVE ECLIPSE 6.5 STRL STRAW (GLOVE) IMPLANT
GLOVE ECLIPSE 7.0 STRL STRAW (GLOVE) IMPLANT
GLOVE ECLIPSE 7.5 STRL STRAW (GLOVE) IMPLANT
GLOVE ECLIPSE 8.0 STRL XLNG CF (GLOVE) IMPLANT
GLOVE ECLIPSE 8.5 STRL (GLOVE) IMPLANT
GLOVE ECLIPSE 9.0 STRL (GLOVE) ×2 IMPLANT
GLOVE EXAM NITRILE LRG STRL (GLOVE) IMPLANT
GLOVE EXAM NITRILE MD LF STRL (GLOVE) IMPLANT
GLOVE EXAM NITRILE XL STR (GLOVE) IMPLANT
GLOVE EXAM NITRILE XS STR PU (GLOVE) IMPLANT
GLOVE INDICATOR 6.5 STRL GRN (GLOVE) IMPLANT
GLOVE INDICATOR 7.0 STRL GRN (GLOVE) IMPLANT
GLOVE INDICATOR 7.5 STRL GRN (GLOVE) IMPLANT
GLOVE INDICATOR 8.0 STRL GRN (GLOVE) IMPLANT
GLOVE INDICATOR 8.5 STRL (GLOVE) ×2 IMPLANT
GLOVE OPTIFIT SS 8.0 STRL (GLOVE) IMPLANT
GLOVE SURG SS PI 6.5 STRL IVOR (GLOVE) IMPLANT
GLOVE SURG SS PI 7.0 STRL IVOR (GLOVE) ×4 IMPLANT
GOWN STRL REUS W/ TWL LRG LVL3 (GOWN DISPOSABLE) IMPLANT
GOWN STRL REUS W/ TWL XL LVL3 (GOWN DISPOSABLE) ×4 IMPLANT
GOWN STRL REUS W/TWL 2XL LVL3 (GOWN DISPOSABLE) IMPLANT
GOWN STRL REUS W/TWL LRG LVL3 (GOWN DISPOSABLE)
GOWN STRL REUS W/TWL XL LVL3 (GOWN DISPOSABLE) ×4
HALTER HD/CHIN CERV TRACTION D (MISCELLANEOUS) ×2 IMPLANT
HEMOSTAT POWDER KIT SURGIFOAM (HEMOSTASIS) IMPLANT
HEMOSTAT POWDER SURGIFOAM 1G (HEMOSTASIS) ×4 IMPLANT
KIT BASIN OR (CUSTOM PROCEDURE TRAY) ×2 IMPLANT
KIT ROOM TURNOVER OR (KITS) ×2 IMPLANT
LIQUID BAND (GAUZE/BANDAGES/DRESSINGS) ×2 IMPLANT
NEEDLE HYPO 18GX1.5 BLUNT FILL (NEEDLE) IMPLANT
NEEDLE SPNL 20GX3.5 QUINCKE YW (NEEDLE) ×2 IMPLANT
NS IRRIG 1000ML POUR BTL (IV SOLUTION) ×2 IMPLANT
PACK LAMINECTOMY NEURO (CUSTOM PROCEDURE TRAY) ×2 IMPLANT
PAD ARMBOARD 7.5X6 YLW CONV (MISCELLANEOUS) ×6 IMPLANT
PLATE 4 75XNS SPNE CVD ANT T (Plate) ×1 IMPLANT
PLATE 4 ATLANTIS TRANS (Plate) ×1 IMPLANT
RUBBERBAND STERILE (MISCELLANEOUS) ×4 IMPLANT
SCREW ST FIX 4 ATL 3120213 (Screw) ×20 IMPLANT
SPACER CERVICAL FRGE 12X14X5 (Spacer) ×1 IMPLANT
SPACER CERVICAL FRGE 12X14X6-7 (Spacer) ×4 IMPLANT
SPACER CERVICAL FRGE 12X14X7-7 (Spacer) ×2 IMPLANT
SPACER FORGE 12X14X5MM-7 (Spacer) ×1 IMPLANT
SPONGE INTESTINAL PEANUT (DISPOSABLE) ×4 IMPLANT
SPONGE SURGIFOAM ABS GEL 100 (HEMOSTASIS) ×2 IMPLANT
SPONGE SURGIFOAM ABS GEL SZ50 (HEMOSTASIS) IMPLANT
STRIP CLOSURE SKIN 1/2X4 (GAUZE/BANDAGES/DRESSINGS) ×2 IMPLANT
SUT VIC AB 3-0 SH 8-18 (SUTURE) ×4 IMPLANT
SUT VICRYL 4-0 PS2 18IN ABS (SUTURE) ×2 IMPLANT
SYR 20ML ECCENTRIC (SYRINGE) ×2 IMPLANT
TAPE CLOTH 4X10 WHT NS (GAUZE/BANDAGES/DRESSINGS) IMPLANT
TOWEL OR 17X24 6PK STRL BLUE (TOWEL DISPOSABLE) ×2 IMPLANT
TOWEL OR 17X26 10 PK STRL BLUE (TOWEL DISPOSABLE) ×2 IMPLANT
TRAP SPECIMEN MUCOUS 40CC (MISCELLANEOUS) ×2 IMPLANT
WATER STERILE IRR 1000ML POUR (IV SOLUTION) ×2 IMPLANT

## 2015-04-11 NOTE — H&P (Signed)
Xavier Livingston is an 59 y.o. male.   Chief Complaint: Neck pain left arm pain HPI: Patient is very pleasant 83 year gentleman is a long-standing neck pain and pain rating to his left arm is a numbness tingling weakness in his hands. Workup has revealed severe cervical stenosis C3-4, C4-5, C5-6, C6-7. Patient's clinical exam was consistent with myelopathy and due to his failure conservative treatment imaging findings and progression of clinical syndrome I recommended decompressive anterior cervical discectomies from C3-C7. I extensively reviewed the risks and benefits of the operation with the patient as well as perioperative course expectations of outcome and alternatives of surgery he understands and agrees to proceed forward.  Past Medical History  Diagnosis Date  . Hypertension   . Hyperlipemia   . Dysrhythmia     irregular heartbeat in the 80's  . Smoker within last 12 months   . Enlarged prostate   . Nocturia more than twice per night   . GERD (gastroesophageal reflux disease)   . Headache   . Arthritis     Past Surgical History  Procedure Laterality Date  . Nasal septum surgery    . Excision of mass of back    . Mass excision N/A 10/21/2013    Procedure: EXCISION MASSES X 2 ON BACK;  Surgeon: Scherry Ran, MD;  Location: [facility];  Service: General;  Laterality: N/A;  . Colonoscopy N/A 08/18/2014    Procedure: COLONOSCOPY;  Surgeon: Rogene Houston, MD;  Location: [facility];  Service: Endoscopy;  Laterality: N/A;  1030-moved to 1200 Ann to notify pt  . Esophagogastroduodenoscopy N/A 08/18/2014    Procedure: ESOPHAGOGASTRODUODENOSCOPY (EGD);  Surgeon: Rogene Houston, MD;  Location: [facility];  Service: Endoscopy;  Laterality: N/A;  Venia Minks dilation  08/18/2014    Procedure: Venia Minks DILATION;  Surgeon: Rogene Houston, MD;  Location: [facility];  Service: Endoscopy;;    History reviewed. No pertinent family history. Social History:  reports that he quit smoking  about 4 weeks ago. His smoking use included Cigarettes. He has a 20 pack-year smoking history. He has never used smokeless tobacco. He reports that he drinks about 12.0 oz of alcohol per week. He reports that he uses illicit drugs.  Allergies: No Known Allergies  Medications Prior to Admission  Medication Sig Dispense Refill  . albuterol (PROAIR HFA) 108 (90 BASE) MCG/ACT inhaler Inhale 2 puffs into the lungs every 6 (six) hours as needed. For shortness of breath/wheezing    . amLODipine (NORVASC) 5 MG tablet Take 5 mg by mouth daily.     [name] HYDROcodone-acetaminophen (NORCO/VICODIN) 5-325 MG per tablet Take 1 tablet by mouth every 6 (six) hours as needed. For pain    . mupirocin ointment (BACTROBAN) 2 % Place 1 application into the nose 2 (two) times daily.    [name] oxycodone-acetaminophen (NARVOX) 10-500 MG per tablet Take 1 tablet by mouth every 8 (eight) hours as needed for pain.    . pantoprazole (PROTONIX) 40 MG tablet Take 40 mg by mouth daily.    . pravastatin (PRAVACHOL) 40 MG tablet Take 40 mg by mouth daily.    . silodosin (RAPAFLO) 8 MG CAPS capsule Take 8 mg by mouth daily with breakfast.    . diclofenac (VOLTAREN) 75 MG EC tablet Take 1 tablet (75 mg total) by mouth 2 (two) times daily. (Patient not taking: Reported on 04/05/2015) 14 tablet 0  . methocarbamol (ROBAXIN) 500 MG tablet Take 1 tablet (500 mg total)  by mouth 3 (three) times daily. (Patient not taking: Reported on 04/05/2015) 21 tablet 0    No results found for this or any previous visit (from the past 48 hour(s)). No results found.  Review of Systems  Constitutional: Negative.   Eyes: Negative.   Respiratory: Negative.   Cardiovascular: Negative.   Gastrointestinal: Negative.   Genitourinary: Negative.   Musculoskeletal: Positive for myalgias, joint pain and neck pain.  Skin: Negative.   Psychiatric/Behavioral: Negative.     Blood pressure 166/98, pulse 69, temperature 98 F (36.7 C), temperature source Oral, resp.  rate 18, height 5\' 11"  (1.803 m), weight 81.194 kg (179 lb), SpO2 100 %. Physical Exam  Constitutional: He is oriented to person, place, and time. He appears well-developed.  HENT:  Head: Normocephalic.  Eyes: Pupils are equal, round, and reactive to light.  Neck: Normal range of motion.  GI: Soft.  Musculoskeletal: Normal range of motion.  Neurological: He is alert and oriented to person, place, and time. He has normal strength. GCS eye subscore is 4. GCS verbal subscore is 5. GCS motor subscore is 6.  Pilar Plate is 5 out of 5 in his deltoid, bicep, tricep, wrist flexion, wrist extension, hand intrinsics.     Assessment/Plan 59 year old gentleman presents for an ACDF at C3-4, C4-5, C5-6, C6-7.  Xavier Livingston P 04/11/2015, 7:18 AM

## 2015-04-11 NOTE — Anesthesia Procedure Notes (Signed)
Procedure Name: Intubation Date/Time: 04/11/2015 7:32 AM Performed by: Clearnce Sorrel Pre-anesthesia Checklist: Patient identified, Patient being monitored, Emergency Drugs available, Timeout performed and Suction available Patient Re-evaluated:Patient Re-evaluated prior to inductionOxygen Delivery Method: Circle system utilized Preoxygenation: Pre-oxygenation with 100% oxygen Intubation Type: IV induction Ventilation: Mask ventilation without difficulty Laryngoscope Size: Mac and 4 Grade View: Grade I Tube type: Oral Tube size: 7.5 mm Number of attempts: 1 Placement Confirmation: ETT inserted through vocal cords under direct vision,  breath sounds checked- equal and bilateral and positive ETCO2 Secured at: 23 cm Tube secured with: Tape Dental Injury: Teeth and Oropharynx as per pre-operative assessment

## 2015-04-11 NOTE — Plan of Care (Signed)
Problem: Consults Goal: Diagnosis - Spinal Surgery Outcome: Completed/Met Date Met:  04/11/15 Cervical Spine Fusion C3-7

## 2015-04-11 NOTE — Anesthesia Postprocedure Evaluation (Signed)
  Anesthesia Post-op Note  Patient: Xavier Livingston  Procedure(s) Performed: Procedure(s) (LRB): Anterior Cervical Discectomy/ Decompression Fusion Cervical three-four, Cervical four-five, Cervical five-six, Cervical six- seven (N/A)  Patient Location: PACU  Anesthesia Type: General  Level of Consciousness: awake and alert   Airway and Oxygen Therapy: Patient Spontanous Breathing  Post-op Pain: mild  Post-op Assessment: Post-op Vital signs reviewed, Patient's Cardiovascular Status Stable, Respiratory Function Stable, Patent Airway and No signs of Nausea or vomiting  Last Vitals:  Filed Vitals:   04/11/15 1300  BP: 169/88  Pulse: 68  Temp: 36.6 C  Resp: 20    Post-op Vital Signs: stable   Complications: No apparent anesthesia complications

## 2015-04-11 NOTE — Transfer of Care (Signed)
Immediate Anesthesia Transfer of Care Note  Patient: Xavier Livingston  Procedure(s) Performed: Procedure(s): Anterior Cervical Discectomy/ Decompression Fusion Cervical three-four, Cervical four-five, Cervical five-six, Cervical six- seven (N/A)  Patient Location: PACU  Anesthesia Type:General  Level of Consciousness: awake, alert  and oriented  Airway & Oxygen Therapy: Patient Spontanous Breathing and Patient connected to nasal cannula oxygen  Post-op Assessment: Report given to RN and Post -op Vital signs reviewed and stable  Post vital signs: Reviewed and stable  Last Vitals:  Filed Vitals:   04/11/15 0544  BP: 166/98  Pulse: 69  Temp: 36.7 C  Resp: 18    Complications: No apparent anesthesia complications

## 2015-04-11 NOTE — Op Note (Signed)
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  EBL: Minimal  History of present illness: Patient is a very pleasant 59 year old gentleman who has had progressive worsening neck pain and bilateral arm pain worse on the left with numbness tingling weakness in his hands. Workup revealed cord compression severe stenosis and severe foraminal stenosis predominantly on the left at C3-4, C4-5, C5-6, C6-7. Due to patient's failure conservative treatment imaging findings and progressive clinical syndrome I recommended decompression and fusion from C3-C7. I extensively reviewed the risks and benefits of the operation with the patient as well as perioperative course expectations of outcome and alternatives of surgery and he understood and agreed to proceed forward.  Operative procedure: Patient brought into the or was induced under general anesthesia positioned supine the neck in slight extension in 5 pounds of halter traction the right 7 his neck was prepped and draped in routine sterile fashion preoperative x-ray localize the appropriate level so a curvilinear incision was made just off midline to the interbody the sternomastoid and superficially of the platysmas dissected and divided longitudinally the avascular tissue, some strap as was felt on previous fashion. Cefazolin Kitners. Interoperative x-ray confirmed a dislocation appropriate level. So annulotomy's were made to marked the disc space at C4-5 lungs close affected laterally from C3-C7 and annulotomy's were made over the disc space at all 4 levels. Positioning the retractor first at C5-6 and C6-7 both this disc spaces were drilled down noted be markedly collapsed  and spondylitic. Unremarkable except illumination both the space were further drilled down aggressive abutting both endplates was carried out at C6-7 first the PLL was identified and removed in piecemeal fashion with a large left-sided spur that was teased away from the dura with a nerve hook and removed piecemeal fashion. Both C7 pedicles were identified both C7 original skeletal mass flush with pedicle. It into decompression this is packed with Gelfoam to second C5-6. In a similar fashion C5-6 was drilled down aggressive abutting both endplates marking laterally both C6 pedicles were identified both C6 nerve roots, social pedicle. After both disc spaces and cleanout to allograft wedges were inserted at C5-6 and C6-7. The retractor was then repositioned and C3-4 and C4-5 were prepared. At C4-5 and this disc space was markedly collapsed with a large posterior spur coming off the stress of the C4 vertebral body and this is all teased off the dura and removed piecemeal fashion this spur was densely adherent to the posterior longitudinal ligament which was densely adherent to the dura so I elected to leave ligament intact but there was a or foraminal stenosis and severe compression of the C5 nerve root. Working underneath the endplate and uncinate I unroofed the uncinate skeletonizing the ligament overlying the C5 nerve root and then tension taken to C5 to right C5 foramen was also opened up. This was impacted Gelfoam and C3-4 was performed in similar fashion aggressive and viable template removed a large posterior spur coming off C3 and both foramina were opened up. Allografts were inserted at C3-4 and C4-5. Then a 75 mm Atlantis translational plate was positioned sized collapsed down superiorly and then all screws were drilled all screws excellent purchase locking mechanisms were engaged. Was ago proceed irrigated meticulous hemostasis was maintained posterior fluoroscopy confirmed good  position of all implants then  a drain was placed the wounds closed in layers with Vicryl's and a running 4 subcuticular and skin. Dermabond benzo and Steri-Strips and a sterile dressing applied patient recovered in stable condition. At the end of case and sponge counts were correct.

## 2015-04-12 ENCOUNTER — Encounter (HOSPITAL_COMMUNITY): Payer: Self-pay | Admitting: Neurosurgery

## 2015-04-12 MED ORDER — CHLORHEXIDINE GLUCONATE CLOTH 2 % EX PADS
6.0000 | MEDICATED_PAD | Freq: Every day | CUTANEOUS | Status: DC
  Administered 2015-04-12 – 2015-04-13 (×2): 6 via TOPICAL

## 2015-04-12 NOTE — Progress Notes (Signed)
PT Cancellation and Discharge Note  Patient Details Name: Xavier Livingston MRN: [id number] DOB: 1956/06/14   Cancelled Treatment:    Reason Eval/Treat Not Completed: PT screened, no needs identified, will sign off.  Per Nursing pt is up independently without AD and only concern is L UE.  Will defer L UE treatment to OT.  Will sign off.     [name], Thornton Papas 04/12/2015, 8:35 AM

## 2015-04-12 NOTE — Progress Notes (Signed)
Occupational Therapy Evaluation Patient Details Name: Xavier Livingston MRN: [id number] DOB: [birth year] Today's Date: 04/12/2015    History of Present Illness Anterior cervical discectomies and fusion at C3-4, C4-5, C5-6, C6-7   Clinical Impression   Began eduction on compensatory techniques for ADL and mobility regarding cervical precautions. Also began education on HEP for weak LUE. Pt given exercises to begin using table slides. Will return today to continue with exercises. Pt will need to follow up with outpt OT. Pt prefers outpt services at [facility]. Pt will have family to assist as needed after D/C. Will plan to see in am prior to D/C. Pt very appreciative of help.     Follow Up Recommendations  Outpatient OT;Supervision - Intermittent (outpt at [facility] is preference)    Equipment Recommendations  None recommended by OT    Recommendations for Other Services       Precautions / Restrictions Precautions Precautions: Cervical Required Braces or Orthoses: Cervical Brace Cervical Brace: Soft collar;At all times;Other (comment) (off for bathing/dressing)      Mobility Bed Mobility Overal bed mobility: Modified Independent             General bed mobility comments: from elevated position. Pt plans to sleep in recliner  Transfers Overall transfer level: Modified independent                    Balance Overall balance assessment: No apparent balance deficits (not formally assessed)                                          ADL Overall ADL's : Needs assistance/impaired Eating/Feeding: Set up   Grooming: Minimal assistance   Upper Body Bathing: Minimal assitance   Lower Body Bathing: Supervison/ safety;Set up   Upper Body Dressing : Moderate assistance   Lower Body Dressing: Set up;Supervision/safety;Sit to/from stand   Toilet Transfer: Modified Independent   Toileting- Clothing Manipulation and Hygiene: Modified  independent       Functional mobility during ADLs: Modified independent General ADL Comments: Began educating pt on compensatory techniques for bathing/dressing per cervical precautions. Pt states he will have his brother assist as needed - brother will need to assist with donning/doffing cervical collar. Discussed home set up and modifications recommended to increase independence and safety.                      Pertinent Vitals/Pain  pain 5/10. Neck. Monitored during session     Hand Dominance Right   Extremity/Trunk Assessment Upper Extremity Assessment Upper Extremity Assessment: LUE deficits/detail LUE Deficits / Details: LUE weakness and sonsory deficits. weaker proxiimally. shoulder flexion 1/5; ER 1/5; abd 1/5; add 2/5; elbow flexion 3/5 ext 3/5 sup 3/5 pron 3/5. wrist flex/ext 3+/5; grip strength @ 3+/5. ext 3+/5 ; isolated finger movement intact. weak pinch @ 3+/5 LUE Sensation: decreased light touch (reorts sensationimproved since surgery) LUE Coordination: decreased fine motor;decreased gross motor   Lower Extremity Assessment Lower Extremity Assessment: Overall WFL for tasks assessed (reports sensation has improved)   Cervical / Trunk Assessment Cervical / Trunk Assessment: Other exceptions (cervical fusion)   Communication Communication Communication: No difficulties   Cognition Arousal/Alertness: Awake/alert Behavior During Therapy: WFL for tasks assessed/performed Overall Cognitive Status: Within Functional Limits for tasks assessed  General Comments       Exercises Exercises: Other exercises Other Exercises Other Exercises: LUE AAROM table slides for FF;Abd and ER - 10-15 repetitions each Other Exercises: wrist flexion/ext againt gravitiy Other Exercises: scaoykar retraction 10-15 repetitions during table slides Other Exercises: AROM LUE supination/pronation with elbow supported Other Exercises: grip strengthening    Shoulder Instructions      Home Living Family/patient expects to be discharged to:: Private residence Living Arrangements: Alone Available Help at Discharge: Family;Friend(s);Available PRN/intermittently Type of Home: House Home Access: Stairs to enter [employer] of Steps: 4   Home Layout: One level     Bathroom Shower/Tub: Tub/shower unit Shower/tub characteristics: Engineer, building services: Handicapped height Bathroom Accessibility: Yes How Accessible: Accessible via walker Home Equipment: Shower seat;Grab bars - tub/shower          Prior Functioning/Environment Level of Independence: Independent             OT Diagnosis: Generalized weakness;Acute pain   OT Problem List: Decreased strength;Decreased range of motion;Decreased coordination;Decreased knowledge of use of DME or AE;Decreased knowledge of precautions;Impaired UE functional use;Pain   OT Treatment/Interventions: Self-care/ADL training;Therapeutic exercise;DME and/or AE instruction;Therapeutic activities;Patient/family education    OT Goals(Current goals can be found in the care plan section) Acute Rehab OT Goals Patient Stated Goal: to be able to use my L arm OT Goal Formulation: With patient Time For Goal Achievement: 04/26/15 Potential to Achieve Goals: Good ADL Goals Pt Will Perform Upper Body Bathing: with set-up;with supervision;sitting Pt Will Perform Upper Body Dressing: with set-up;with supervision;sitting Pt/caregiver will Perform Home Exercise Program: Left upper extremity;With Supervision;With written HEP provided;With theraputty (fine motor; P/AAROM L shoulder) Additional ADL Goal #1: Pt will verbalize understanding of cervical precautions for ADL  OT Frequency: Min 2X/week   Barriers to D/C:            Co-evaluation              End of Session Equipment Utilized During Treatment: Cervical collar Nurse Communication: Mobility status;Other (comment) (D/C  needs)  Activity Tolerance: Patient tolerated treatment well Patient left: in bed;with call bell/phone within reach   Time: 0900-0935 OT Time Calculation (min): 35 min Charges:  OT General Charges $OT Visit: 1 Procedure OT Evaluation $Initial OT Evaluation Tier I: 1 Procedure OT Treatments $Therapeutic Activity: 8-22 mins G-Codes:    Danaisha Celli,HILLARY May 04, 2015, 10:04 AM   Luisa Dago, OTR/L  [phone] May 04, 2015

## 2015-04-12 NOTE — Progress Notes (Signed)
Patient ID: Xavier Livingston, male   DOB: 1956-05-17, 58 y.o.   MRN: [id number] Patient doing well significant improvement and numbness tingling weakness in his hands and numbness and pain in the left side is significantly improved  Left deltoid weakness 1-2 out of 5, biceps 4+ out of 5 otherwise patient's 5 out of 5  Incision clean dry and intact continue to observe work with physical therapy

## 2015-04-12 NOTE — Progress Notes (Signed)
Occupational Therapy Treatment Patient Details Name: Xavier Livingston MRN: [id number] DOB: 1956-11-20 Today's Date: 04/12/2015    History of present illness Anterior cervical discectomies and fusion at C3-4, C4-5, C5-6, C6-7   OT comments  Pt seen second session this pm to further educate on HEP for LUE. Pt given written exercises and states he had been completing exercises throughout the day that he was taught earlier. Will plan to return in am to further educate on strengthening for shoulder. Continue to recommend outpt OT at [facility] after D/C.   Follow Up Recommendations  Outpatient OT;Supervision - Intermittent    Equipment Recommendations  None recommended by OT    Recommendations for Other Services      Precautions / Restrictions Precautions Precautions: Cervical Required Braces or Orthoses: Cervical Brace Cervical Brace: Soft collar;At all times;Other (comment)       Mobility Bed Mobility    mod I              Transfers    MOd I                  Balance                                               Cognition   Behavior During Therapy: WFL for tasks assessed/performed Overall Cognitive Status: Within Functional Limits for tasks assessed                       Extremity/Trunk Assessment   LUE general weakness - greater weakness proximally (C3-4; 5-6 distribution)            Exercises Other Exercises Other Exercises: theraputty (medium) HEP with handouts Other Exercises: grip and pinch strengthening using squeeze ball Other Exercises: Reviewed table slides. Pt completing with vc to decrease shoulder elevation Other Exercises: self ROM for FF and ER wtih min vc   Shoulder Instructions  table slides; keep L UE supported to decrease traction on shoulder     General Comments      Pertinent Vitals/ Pain        3/5 neck; limited activity; repositioned  Home Living    alone                                       Prior Functioning/Environment              Frequency Min 2X/week     Progress Toward Goals  OT Goals(current goals can now be found in the care plan section)  Progress towards OT goals: Progressing toward goals  Acute Rehab OT Goals Patient Stated Goal: to be able to use my L arm OT Goal Formulation: With patient Time For Goal Achievement: 04/26/15 Potential to Achieve Goals: Good ADL Goals Pt Will Perform Upper Body Bathing: with set-up;with supervision;sitting Pt Will Perform Upper Body Dressing: with set-up;with supervision;sitting Pt/caregiver will Perform Home Exercise Program: Left upper extremity;With Supervision;With written HEP provided;With theraputty Additional ADL Goal #1: Pt will verbalize understanding of cervical precautions for ADL  Plan Discharge plan remains appropriate    Co-evaluation                 End of Session Equipment Utilized During Treatment: Cervical collar   Activity Tolerance  Patient tolerated treatment well   Patient Left in bed;with call bell/phone within reach   Nurse Communication Mobility status;Other (comment)        Time: 1324-4010 OT Time Calculation (min): 15 min  Charges: OT General Charges $OT Visit: 1 Procedure OT Treatments $Therapeutic Exercise: 8-22 mins  Camara Renstrom,HILLARY 04/12/2015, 1:50 PM   [facility], OTR/L  [phone] 04/12/2015

## 2015-04-13 MED ORDER — OXYCODONE-ACETAMINOPHEN 5-325 MG PO TABS: 1.0000 | ORAL_TABLET | ORAL | Status: DC | PRN

## 2015-04-13 NOTE — Discharge Instructions (Signed)

## 2015-04-13 NOTE — Discharge Summary (Signed)
  Physician Discharge Summary  Patient ID: LEVII HAIRFIELD MRN: [id number] DOB/AGE: 60-Oct-1957 60 y.o.  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 Patient did very well with recovered in the floor was ambulating and voiding spontaneously tolerating a regular diet dysphagia progressively improved he had some left deltoid weakness and medially postoperatively was improving but significant improvement in numbness tingling in his hands and legs and walking. Patient be discharged her scheduled follow-up in 2 weeks.  Consults: Significant Diagnostic Studies: Treatments: ACDF C3-4, C4-5, C5-6. Discharge Exam: Blood pressure 156/91, pulse 83, temperature 98.4 F (36.9 C), temperature source Oral, resp. rate 18, height 5\' 11"  (1.803 m), weight 81.194 kg (179 lb), SpO2 97 %. Left deltoid 1-2 out of 5 otherwise 5 out of 5  Disposition: Home     Medication List    TAKE these medications        amLODipine 5 MG tablet  Commonly known as:  NORVASC  Take 5 mg by mouth daily.     HYDROcodone-acetaminophen 5-325 MG per tablet  Commonly known as:  NORCO/VICODIN  Take 1 tablet by mouth every 6 (six) hours as needed. For pain     mupirocin ointment 2 %  Commonly known as:  BACTROBAN  Place 1 application into the nose 2 (two) times daily.     oxycodone-acetaminophen 10-500 MG per tablet  Commonly known as:  NARVOX  Take 1 tablet by mouth every 8 (eight) hours as needed for pain.     oxyCODONE-acetaminophen 5-325 MG per tablet  Commonly known as:  PERCOCET/ROXICET  Take 1-2 tablets by mouth every 4 (four) hours as needed for moderate pain.     pantoprazole 40 MG tablet  Commonly  known as:  PROTONIX  Take 40 mg by mouth daily.     pravastatin 40 MG tablet  Commonly known as:  PRAVACHOL  Take 40 mg by mouth daily.     PROAIR HFA 108 (90 BASE) MCG/ACT inhaler  Generic drug:  albuterol  Inhale 2 puffs into the lungs every 6 (six) hours as needed. For shortness of breath/wheezing     silodosin 8 MG Caps capsule  Commonly known as:  RAPAFLO  Take 8 mg by mouth daily with breakfast.      ASK your doctor about these medications        diclofenac 75 MG EC tablet  Commonly known as:  VOLTAREN  Take 1 tablet (75 mg total) by mouth 2 (two) times daily.     methocarbamol 500 MG tablet  Commonly known as:  ROBAXIN  Take 1 tablet (500 mg total) by mouth 3 (three) times daily.           Follow-up Information    Follow up with [facility], MD.   Specialty:  Neurosurgery   Contact information:   [address] [place] [address] [phone]       Signed: Elaina Hoops 04/13/2015, 7:39 AM

## 2015-04-13 NOTE — Progress Notes (Addendum)
Patient alert and oriented, mae's well, voiding adequate amount of urine, swallowing without difficulty, c/o mild pain at time of discharged. Patient discharged home with family. Script and discharged instructions given to patient. Patient and family stated understanding of d/c instructions given and has an appointment with MD.

## 2015-04-13 NOTE — Progress Notes (Signed)
Patient ID: Xavier Livingston, male   DOB: 09/30/1956, 59 y.o.   MRN: [id number] Patient doing well significant improvement numbness tingling weakness in his hands still has left deltoid weakness at 1-2 out of 5 although slightly improved from yesterday.  Incision clean dry and intact  Discharge home

## 2015-04-13 NOTE — Progress Notes (Signed)
Occupational Therapy Treatment Patient Details Name: Xavier Livingston MRN: [id number] DOB: Jan 29, 1956 Today's Date: 04/13/2015    History of present illness Anterior cervical discectomies and fusion at C3-4, C4-5, C5-6, C6-7   OT comments  OT gave pt additional exercise handout and performed exercises in session. Continue to recommend Outpatient OT.  Follow Up Recommendations  Outpatient OT;Supervision - Intermittent    Equipment Recommendations  None recommended by OT    Recommendations for Other Services      Precautions / Restrictions Precautions Precautions: Cervical Required Braces or Orthoses: Cervical Brace Cervical Brace: Soft collar;At all times;Other (comment) (off for bathing/dressing) Restrictions Weight Bearing Restrictions: No       Mobility Bed Mobility               General bed mobility comments: not assessed  Transfers Overall transfer level: Needs assistance   Transfers: Sit to/from Stand Sit to Stand: Modified independent (Device/Increase time);Supervision         General transfer comment: Supervision for stand to sit transfer to bed-cues for technique.    Balance    No LOB in session-not formally assessed.                               ADL Overall ADL's : Needs assistance/impaired                         Toilet Transfer: Supervision/safety;Ambulation (bed; discussed posture with stand to sit transfer)             General ADL Comments: Cues for precautions in session. Reviewed UB dressing technique and discussed UB clothing that may be easier to maintain cervical precautions. Pt veralized that he figured out how to don cervical collar-OT tightened for him in session.      Vision                     Perception     Praxis      Cognition  Awake/Alert Behavior During Therapy: WFL for tasks assessed/performed Overall Cognitive Status: Within Functional Limits for tasks assessed                        Extremity/Trunk Assessment               Exercises Other Exercises Other Exercises: Pt performed scapular retraction exercise, table slides with left upper extremity (abduction-out to side and also in front of him), performed table exercise with left arm in circular pattern, and wall crawls with left upper extremity. Pt verbalized he felt good about exercies taught to him yesterday and he has been performing them.   Shoulder Instructions       General Comments      Pertinent Vitals/ Pain       Pain Assessment: 0-10 Pain Score: 4  Pain Location: neck Pain Intervention(s): Monitored during session  Home Living                                          Prior Functioning/Environment              Frequency Min 2X/week     Progress Toward Goals  OT Goals(current goals can now be found in the care plan section)  Progress towards OT goals: Progressing  toward goals  Acute Rehab OT Goals Patient Stated Goal: not stated OT Goal Formulation: With patient Time For Goal Achievement: 04/26/15 Potential to Achieve Goals: Good ADL Goals Pt Will Perform Upper Body Bathing: with set-up;with supervision;sitting Pt Will Perform Upper Body Dressing: with set-up;with supervision;sitting Pt/caregiver will Perform Home Exercise Program: Left upper extremity;With Supervision;With written HEP provided;With theraputty Additional ADL Goal #1: Pt will verbalize understanding of cervical precautions for ADL  Plan Discharge plan remains appropriate    Co-evaluation                 End of Session Equipment Utilized During Treatment: Cervical collar   Activity Tolerance Patient tolerated treatment well   Patient Left in bed   Nurse Communication Other (comment) (made sure he was set up with Outpatient)        Time: 8016-5537 OT Time Calculation (min): 15 min  Charges: OT General Charges $OT Visit: 1 Procedure OT Treatments $Therapeutic  Exercise: 8-22 mins  Benito Mccreedy OTR/L [phone] 04/13/2015, 10:20 AM

## 2015-04-25 ENCOUNTER — Encounter (HOSPITAL_COMMUNITY): Payer: Self-pay | Admitting: Specialist

## 2015-04-25 ENCOUNTER — Ambulatory Visit (HOSPITAL_COMMUNITY): Payer: Medicaid Other | Attending: Neurosurgery | Admitting: Specialist

## 2015-04-25 DIAGNOSIS — M6281 Muscle weakness (generalized): Secondary | ICD-10-CM | POA: Insufficient documentation

## 2015-04-25 DIAGNOSIS — R279 Unspecified lack of coordination: Secondary | ICD-10-CM | POA: Diagnosis not present

## 2015-04-25 DIAGNOSIS — M25619 Stiffness of unspecified shoulder, not elsewhere classified: Secondary | ICD-10-CM

## 2015-04-25 DIAGNOSIS — M7580 Other shoulder lesions, unspecified shoulder: Secondary | ICD-10-CM | POA: Diagnosis not present

## 2015-04-25 DIAGNOSIS — Z981 Arthrodesis status: Secondary | ICD-10-CM | POA: Diagnosis not present
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
## 2015-04-25 NOTE — Patient Instructions (Signed)
Active Assistive Upper Extremity Shoulder Flexion   Lying on your back with stick in both hands at waist level, palms up. Raise stick to shoulder height, leading with right side. Perform __10_ reps.  Copyright  VHI. All rights reserved.  PROM: Elbow Flexion / Extension   Grasp left arm at wrist and gently bend elbow as far as possible. Then straighten arm as far as possible. Hold each position ____ seconds. Repeat ____ times per set. Do ____ sets per session. Do ____ sessions per day.  Can also hold broom stick and complete elbow curl using right arm to help move the left.  Copyright  VHI. All rights reserved.   Fine Motor Coordination Exercises  Perform the following exercises 3 times a day, as recommended by your occupational therapist.   Close all fingers and thumb into a tight fist and then open wide. (10 times)  Palm of hand on table, spread fingers apart, then together. (10 times)  Lift fingers and thumb off table one at a time. Increase speed as able. (10 times)  Touch thumb to each fingertip. Increase speed as able. (10 times)  Pick up 5 small objects (coins, marbles, paperclips, beads, etc.) one at a time and hold them in hand, then place them one by one onto the table.  Play card games. Practice shuffling and dealing cards. Flip cards over onto table one by one.  Practice screwing and unscrewing nuts/bolts.  Use scissors to cut paper.  Practice writing skills, dot to dot, puzzles, etc.  With tweezers, pick up small objects and put into a small container. Try sorting beads or buttons.

## 2015-05-25 ENCOUNTER — Ambulatory Visit (HOSPITAL_COMMUNITY): Payer: Medicaid Other | Attending: Neurosurgery | Admitting: Specialist

## 2015-05-25 DIAGNOSIS — R279 Unspecified lack of coordination: Secondary | ICD-10-CM | POA: Insufficient documentation

## 2015-05-25 DIAGNOSIS — M7580 Other shoulder lesions, unspecified shoulder: Secondary | ICD-10-CM | POA: Diagnosis not present

## 2015-05-25 DIAGNOSIS — Z981 Arthrodesis status: Secondary | ICD-10-CM | POA: Diagnosis not present

## 2015-05-25 DIAGNOSIS — M6281 Muscle weakness (generalized): Secondary | ICD-10-CM | POA: Insufficient documentation

## 2015-05-25 NOTE — Therapy (Signed)
[place], [place], [address] Phone: [phone]   Fax:  [phone]  Occupational Therapy Treatment  Patient Details  Name: Xavier Livingston MRN: [id number] Date of Birth: October 01, 1956 Referring Provider:  Lucia Gaskins, MD  Encounter Date: 05/25/2015      OT End of Session - 05/25/15 1059    Visit Number 2   Number of Visits 16   Date for OT Re-Evaluation 06/24/15   Authorization Type Medicaid - requesting 3 visits (patient is discussing case with a lawyer and may decide to continue as self pay)   Authorization Time Period 3 visits approved 5/27-7/21   Authorization - Visit Number 1   Authorization - Number of Visits 3   OT Start Time 1015   OT Stop Time 1058   OT Time Calculation (min) 43 min   Activity Tolerance Patient tolerated treatment well   Behavior During Therapy [facility] for tasks assessed/performed      Past Medical History  Diagnosis Date  . Hypertension   . Hyperlipemia   . Dysrhythmia     irregular heartbeat in the 80's  . Smoker within last 12 months   . Enlarged prostate   . Nocturia more than twice per night   . GERD (gastroesophageal reflux disease)   . Headache   . Arthritis     Past Surgical History  Procedure Laterality Date  . Nasal septum surgery    . Excision of mass of back    . Mass excision N/A 10/21/2013    Procedure: EXCISION MASSES X 2 ON BACK;  Surgeon: Scherry Ran, MD;  Location: [facility];  Service: General;  Laterality: N/A;  . Colonoscopy N/A 08/18/2014    Procedure: COLONOSCOPY;  Surgeon: Rogene Houston, MD;  [facility];  Service: Endoscopy;  Laterality: N/A;  1030-moved to 1200 Ann to notify pt  . Esophagogastroduodenoscopy N/A 08/18/2014    Procedure: ESOPHAGOGASTRODUODENOSCOPY (EGD);  Surgeon: Rogene Houston, MD;  [facility];  Service: Endoscopy;  Laterality: N/A;  Venia Minks dilation  08/18/2014    Procedure: Venia Minks DILATION;   Surgeon: Rogene Houston, MD;  [facility];  Service: Endoscopy;;  . Anterior cervical decompression/discectomy fusion 4 levels N/A 04/11/2015    Procedure: Anterior Cervical Discectomy/ Decompression Fusion Cervical three-four, Cervical four-five, Cervical five-six, Cervical six- seven;  Surgeon: Kary Kos, MD;  Location: [place] [facility]UR[facility] ORS;  Service: Neurosurgery;  Laterality: N/A;    There were no vitals filed for this visit.  Visit Diagnosis:  Lack of coordination  Muscle left arm weakness      Subjective Assessment - 05/25/15 1018    Subjective  S:  I have had a lot of pain in my left arm lately.  It feels like I have a knot.   Currently in Pain? Yes   Pain Score 7    Pain Location Arm   Pain Orientation Left;Lateral   Pain Descriptors / Indicators Throbbing   Pain Type Acute pain   Pain Onset 1 to 4 weeks ago            [facility] OT Assessment - 05/25/15 0001    Assessment   Diagnosis  Left Upper Extremity Weakness S/P Anterior Cervical Discectomy and Fusion    Precautions   Precautions Cervical   Precaution Comments collar at all times, no lifting, no reaching overhead   Required Braces or Orthoses Cervical Brace  OT Treatments/Exercises (OP) - 05/25/15 0001    Exercises   Exercises Shoulder;Elbow;Wrist;Hand   Shoulder Exercises: ROM/Strengthening   Other ROM/Strengthening Exercises in seated, OT positioned patients left shoulder in 90 flexion and instructed to hold position X 5 seconds, patient had max difficulty maintaining position and required min-mod pa facilitation to maintain position completetd 5 times    Shoulder Exercises: Stretch   Table Stretch - Flexion --  25 times    Table Stretch - Abduction --  25 times    Table Stretch - External Rotation --  25 times   Elbow Exercises   Elbow Extension AROM;10 reps  and flexion   Forearm Supination AROM;10 reps   Forearm Pronation AROM;10 reps   Wrist Flexion AROM;10 reps    Wrist Extension AROM;10 reps   Fine Motor Coordination   Tendon Glides 10 times AROM   Additional Wrist Exercises   Sponges  X 3   Theraputty Roll;Grip;Pinch  red putty tactile cues to depress shoulder blade   Theraputty - Pinch red putty 2 pint pincer pinch   Theraputty - Locate Pegs 10 with red putty   Fine Motor Coordination   Other Fine Motor Exercises in hand manipulation with 10 beads without difficulty   Manual Therapy   Manual Therapy Myofascial release   Myofascial Release Myofascial release and manual stretching to left upper arm, scapular, and shoulder region to decrease pain and restrictions and improve pain free mobility in left  shoulder and scapular region.                OT Education - 05/25/15 1058    Education provided Yes   Education Details conitnue with HEP   Person(s) Educated Patient          OT Short Term Goals - 05/25/15 1101    OT SHORT TERM GOAL #1   Title Patient will be educated on a HEP.   Time 4   Period Weeks   Status On-going   OT SHORT TERM GOAL #2   Title Patient will improve LUE shoulder  AROM to [facility] for increased ability to don and doff shirts.   Time 4   Period Weeks   Status On-going   OT SHORT TERM GOAL #3   Title Patient will improve left arm strength to good throughout to improve safety and independence with ADLs.   Time 4   Period Weeks   Status On-going   OT SHORT TERM GOAL #4   Title Patient will improve left grip strength by 15 pounds and pinch strength by 2 pounds for increased ability to maintain grasp on household objects.   Time 4   Period Weeks   Status On-going   OT SHORT TERM GOAL #5   Title Patient will improve fine motor coordination by improving ability to complete the nine hole peg test in 25" or less.   Time 4   Period Weeks   Status On-going           OT Long Term Goals - 05/25/15 1102    OT LONG TERM GOAL #1   Title Patient will be able to complete all B/IADLs and leisure activities using  LUE as he did prior to injury.   Time 8   Period Weeks   Status On-going   OT LONG TERM GOAL #2   Title Patient will have WNL AROM in his LUE for increased ability to reach overhead and behind his back.   Time 8   Period  Weeks   Status On-going   OT LONG TERM GOAL #3   Title Patient will have 4+/5 strength in his LUE in order to pick up gardening tools.   Time 8   Period Weeks   Status On-going   OT LONG TERM GOAL #4   Title Patient will have [facility] for increased ability to pick up coins by decreasing completion time on Nine Hole Peg Test to 21 seconds or less.   Time 8   Period Weeks   Status On-going   OT LONG TERM GOAL #5   Title Patient will increase left grip strength by 40 pounds and left pinch strength b 8 pounds for increased ability to open containers.    Time 8   Period Weeks   Status On-going               Plan - 05/25/15 1100    Clinical Impression Statement A:  Paitent requires tactile cues to depress shoulder when completing hand strengthening activities.  Patient unable to actively flex shoulder above 30 degrees.  compelted flex and hold with mod pa and max vg with shoulder flexion at 90   Plan P:  continue shoulder AROM and development of reach activities.  increase to min pa with place and hold exercises.         Problem List Patient Active Problem List   Diagnosis Date Noted  . Myelopathy, spondylogenic, cervical 04/11/2015  . Essential hypertension, benign 03/17/2014  . Dysphagia, unspecified(787.20) 03/17/2014  . High cholesterol 03/17/2014    Vangie Bicker, OTR/L [phone]  05/25/2015, 11:03 AM  [facility] [address] [place], [place], [address] Phone: [phone]   Fax:  [phone]

## 2015-06-01 ENCOUNTER — Ambulatory Visit (HOSPITAL_COMMUNITY): Payer: Medicaid Other | Admitting: Specialist

## 2015-06-01 DIAGNOSIS — M6281 Muscle weakness (generalized): Secondary | ICD-10-CM

## 2015-06-01 DIAGNOSIS — R279 Unspecified lack of coordination: Secondary | ICD-10-CM

## 2015-06-01 NOTE — Therapy (Signed)
[place], [place], [address] Phone: [phone]   Fax:  [phone]  Occupational Therapy Treatment  Patient Details  Name: Xavier Livingston MRN: [id number] Date of Birth: 1956/07/24 Referring Provider:  Kary Kos, MD  Encounter Date: 06/01/2015      OT End of Session - 06/01/15 1104    Visit Number 3   Number of Visits 16   Date for OT Re-Evaluation 06/24/15   Authorization Type Medicaid - requesting 3 visits (patient is discussing case with a lawyer and may decide to continue as self pay)   Authorization Time Period 3 visits approved 5/27-7/21   Authorization - Visit Number 2   Authorization - Number of Visits 3   OT Start Time 1020   OT Stop Time 1102   OT Time Calculation (min) 42 min   Activity Tolerance Patient tolerated treatment well   Behavior During Therapy [facility] for tasks assessed/performed      Past Medical History  Diagnosis Date  . Hypertension   . Hyperlipemia   . Dysrhythmia     irregular heartbeat in the 80's  . Smoker within last 12 months   . Enlarged prostate   . Nocturia more than twice per night   . GERD (gastroesophageal reflux disease)   . Headache   . Arthritis     Past Surgical History  Procedure Laterality Date  . Nasal septum surgery    . Excision of mass of back    . Mass excision N/A 10/21/2013    Procedure: EXCISION MASSES X 2 ON BACK;  Surgeon: Scherry Ran, MD;  Location: [facility];  Service: General;  Laterality: N/A;  . Colonoscopy N/A 08/18/2014    Procedure: COLONOSCOPY;  Surgeon: Rogene Houston, MD;  [facility];  Service: Endoscopy;  Laterality: N/A;  1030-moved to 1200 Ann to notify pt  . Esophagogastroduodenoscopy N/A 08/18/2014    Procedure: ESOPHAGOGASTRODUODENOSCOPY (EGD);  Surgeon: Rogene Houston, MD;  [facility];  Service: Endoscopy;  Laterality: N/A;  Venia Minks dilation  08/18/2014    Procedure: Venia Minks DILATION;  Surgeon:  Rogene Houston, MD;  [facility];  Service: Endoscopy;;  . Anterior cervical decompression/discectomy fusion 4 levels N/A 04/11/2015    Procedure: Anterior Cervical Discectomy/ Decompression Fusion Cervical three-four, Cervical four-five, Cervical five-six, Cervical six- seven;  Surgeon: Kary Kos, MD;  Location: [place] NEURO ORS;  Service: Neurosurgery;  Laterality: N/A;    There were no vitals filed for this visit.  Visit Diagnosis:  Lack of coordination  Muscle left arm weakness      Subjective Assessment - 06/01/15 1021    Subjective  S:  I can tell I am getting stronger.  I mowed and today I am going to trim my hedges.   Currently in Pain? Yes   Pain Score 6    Pain Location Arm   Pain Orientation Left;Lateral   Pain Descriptors / Indicators Throbbing   Pain Type Acute pain                      OT Treatments/Exercises (OP) - 06/01/15 0001    Exercises   Exercises Shoulder;Elbow;Wrist;Hand   Shoulder Exercises: ROM/Strengthening   Other ROM/Strengthening Exercises in seated, OT positioned patients left shoulder in 90 flexion and instructed to hold position X 5 seconds, patient had max difficulty maintaining position and required min-mod pa facilitation to maintain position completetd 5 times with  min assist - increased independence this date, and increased pain   Elbow Exercises   Other elbow exercises pinch tree placed 40 clothes pins from bucket to horizontal bars independently then horizontal to vertical and then back to bucket for increased development of reach and pinch strength - difficulty extending elbow when reaching towards end of activty - therapist provided min facilitation at elbow to improve extension and ability to reach.    Additional Wrist Exercises   Sponges all X 3   Theraputty Roll;Grip;Pinch  less tacltile cues needed this date for shoulder blade depre   Theraputty - Pinch gripped with forearm supinated and pronated and then piched  utilizing 2 point pincer grasp  and lateral pinch vg during pinching required to depress shoulder blade and use proper positioning for pinching   Theraputty - Locate Pegs 10 with red putty                  OT Short Term Goals - 05/25/15 1101    OT SHORT TERM GOAL #1   Title Patient will be educated on a HEP.   Time 4   Period Weeks   Status On-going   OT SHORT TERM GOAL #2   Title Patient will improve LUE shoulder  AROM to [facility] for increased ability to don and doff shirts.   Time 4   Period Weeks   Status On-going   OT SHORT TERM GOAL #3   Title Patient will improve left arm strength to good throughout to improve safety and independence with ADLs.   Time 4   Period Weeks   Status On-going   OT SHORT TERM GOAL #4   Title Patient will improve left grip strength by 15 pounds and pinch strength by 2 pounds for increased ability to maintain grasp on household objects.   Time 4   Period Weeks   Status On-going   OT SHORT TERM GOAL #5   Title Patient will improve fine motor coordination by improving ability to complete the nine hole peg test in 25" or less.   Time 4   Period Weeks   Status On-going           OT Long Term Goals - 05/25/15 1102    OT LONG TERM GOAL #1   Title Patient will be able to complete all B/IADLs and leisure activities using LUE as he did prior to injury.   Time 8   Period Weeks   Status On-going   OT LONG TERM GOAL #2   Title Patient will have WNL AROM in his LUE for increased ability to reach overhead and behind his back.   Time 8   Period Weeks   Status On-going   OT LONG TERM GOAL #3   Title Patient will have 4+/5 strength in his LUE in order to pick up gardening tools.   Time 8   Period Weeks   Status On-going   OT LONG TERM GOAL #4   Title Patient will have [facility] for increased ability to pick up coins by decreasing completion time on Nine Hole Peg Test to 21 seconds or less.   Time 8   Period Weeks   Status On-going   OT LONG  TERM GOAL #5   Title Patient will increase left grip strength by 40 pounds and left pinch strength b 8 pounds for increased ability to open containers.    Time 8   Period Weeks   Status On-going  Plan - 06/01/15 1105    Clinical Impression Statement A:  Patiemt required less vg and cuing to depress shoulder during hand exercises, however he did continue to do so.  Completed pinch tree, tired at end of this activity and required assistance to fully extend his elbow while reaching.    Plan P:  decrease restrictions in left shoulder to mod from max, increase to green moderate resist green theraputty.        Problem List Patient Active Problem List   Diagnosis Date Noted  . Myelopathy, spondylogenic, cervical 04/11/2015  . Essential hypertension, benign 03/17/2014  . Dysphagia, unspecified(787.20) 03/17/2014  . High cholesterol 03/17/2014    Vangie Bicker, OTR/L [phone]  06/01/2015, 11:08 AM  [facility] [address] [place], [place], [address] Phone: [phone]   Fax:  [phone]

## 2015-06-08 ENCOUNTER — Ambulatory Visit (HOSPITAL_COMMUNITY): Payer: Medicaid Other | Attending: Neurosurgery | Admitting: Specialist

## 2015-06-08 DIAGNOSIS — M6281 Muscle weakness (generalized): Secondary | ICD-10-CM | POA: Diagnosis present

## 2015-06-08 DIAGNOSIS — R279 Unspecified lack of coordination: Secondary | ICD-10-CM | POA: Insufficient documentation

## 2015-06-08 DIAGNOSIS — Z981 Arthrodesis status: Secondary | ICD-10-CM | POA: Diagnosis present

## 2015-06-08 NOTE — Therapy (Signed)
[place], [place], [address] Phone: [phone]   Fax:  [phone]  Occupational Therapy Treatment and Reassessment  Patient Details  Name: Xavier Livingston MRN: [id number] Date of Birth: Nov 16, 1956 Referring Provider:  Kary Kos, MD  Encounter Date: 06/08/2015      OT End of Session - 06/08/15 1036    Visit Number 4   Number of Visits 16   Date for OT Re-Evaluation 06/24/15   Authorization Type Medicaid - requesting 3 visits (patient is discussing case with a lawyer and may decide to continue as self pay) has used all medicaid covered visits, will wait to hear from patient regarding settlement and coverage of visits.   Authorization Time Period 3 visits approved 5/27-7/21   Authorization - Visit Number 3   Authorization - Number of Visits 3   OT Start Time 1010   OT Stop Time 1100   OT Time Calculation (min) 50 min   Activity Tolerance Patient tolerated treatment well   Behavior During Therapy [facility] for tasks assessed/performed      Past Medical History  Diagnosis Date  . Hypertension   . Hyperlipemia   . Dysrhythmia     irregular heartbeat in the 80's  . Smoker within last 12 months   . Enlarged prostate   . Nocturia more than twice per night   . GERD (gastroesophageal reflux disease)   . Headache   . Arthritis     Past Surgical History  Procedure Laterality Date  . Nasal septum surgery    . Excision of mass of back    . Mass excision N/A 10/21/2013    Procedure: EXCISION MASSES X 2 ON BACK;  Surgeon: Scherry Ran, MD;  Location: [facility];  Service: General;  Laterality: N/A;  . Colonoscopy N/A 08/18/2014    Procedure: COLONOSCOPY;  Surgeon: Rogene Houston, MD;  [facility];  Service: Endoscopy;  Laterality: N/A;  1030-moved to 1200 Ann to notify pt  . Esophagogastroduodenoscopy N/A 08/18/2014    Procedure: ESOPHAGOGASTRODUODENOSCOPY (EGD);  Surgeon: Rogene Houston, MD;  [facility];  Service: Endoscopy;  Laterality: N/A;  Venia Minks dilation  08/18/2014    Procedure: Venia Minks DILATION;  Surgeon: Rogene Houston, MD;  [facility];  Service: Endoscopy;;  . Anterior cervical decompression/discectomy fusion 4 levels N/A 04/11/2015    Procedure: Anterior Cervical Discectomy/ Decompression Fusion Cervical three-four, Cervical four-five, Cervical five-six, Cervical six- seven;  Surgeon: Kary Kos, MD;  Location: [place] NEURO ORS;  Service: Neurosurgery;  Laterality: N/A;    There were no vitals filed for this visit.  Visit Diagnosis:  Lack of coordination  Muscle left arm weakness  Status post cervical spinal fusion      Subjective Assessment - 06/08/15 1028    Subjective  S:  I am having so much pain in my left arm, for the past 2 days.   Currently in Pain? Yes   Pain Score 9    Pain Location Arm   Pain Orientation Left;Lateral   Pain Descriptors / Indicators Aching      Therapeutic activities: Green theraputty roll, grip, pinch locate 10 beads with vg to depress shoulder blade.       [facility] OT Assessment - 06/08/15 0001    Assessment   Diagnosis  Left Upper Extremity Weakness S/P Anterior Cervical Discectomy and Fusion    Precautions   Precautions Cervical   ADL   ADL  comments Patient reports increased use of left arm, has been able to complete mowing, depress levers on the mower.  He has signficant pain in his left arm and shoulder which is limiting his activity.   Coordination   Right 9 Hole Peg Test 19.32"  19.32"   Left 9 Hole Peg Test 21.39"  27.59"   AROM   Right/Left Shoulder Left   Left Shoulder Flexion 50 Degrees  50   Left Shoulder ABduction 45 Degrees  40   Left Shoulder Internal Rotation 90 Degrees  71   Left Shoulder External Rotation 70 Degrees  65   Right/Left Elbow Left   Left Elbow Flexion 128  115   Left Elbow Extension 0  0   Left Forearm Pronation 90 Degrees  90   Left Forearm Supination 90 Degrees  90    Right/Left Wrist Left   Left Wrist Extension 62 Degrees  50   Left Wrist Flexion 80 Degrees  81   PROM   Overall PROM Comments [facility]   Strength   Left Shoulder Flexion 2/5  2/5   Left Shoulder ABduction 2/5  2/5   Left Shoulder Internal Rotation 2/5  2/5   Left Shoulder External Rotation 2/5  2/5   Left Elbow Flexion 4+/5  3+/5   Left Elbow Extension 4+/5  3+/5   Left Forearm Pronation 4+/5  4/5   Left Forearm Supination 4+/5  4/5   Left Wrist Flexion 4+/5  4/5   Left Wrist Extension 4+/5  4/5   Hand Function   Right Hand Grip (lbs) 95  100   Right Hand Lateral Pinch 22 lbs  22   Right Hand 3 Point Pinch 22 lbs  16   Left Hand Grip (lbs) 35  20   Left Hand Lateral Pinch 18 lbs  4   Left 3 point pinch 16 lbs  6                            OT Short Term Goals - 06/08/15 1029    OT SHORT TERM GOAL #1   Title Patient will be educated on a HEP.   Time 4   Period Weeks   Status Achieved   OT SHORT TERM GOAL #2   Title Patient will improve LUE shoulder  AROM to [facility] for increased ability to don and doff shirts.   Time 4   Period Weeks   Status On-going   OT SHORT TERM GOAL #3   Title Patient will improve left arm strength to good throughout to improve safety and independence with ADLs.   Time 4   Period Weeks   Status Partially Met  elbow, wrist, forearm 4+/5, shoulder 2/5.   OT SHORT TERM GOAL #4   Title Patient will improve left grip strength by 15 pounds and pinch strength by 2 pounds for increased ability to maintain grasp on household objects.   Time 4   Period Weeks   Status Achieved   OT SHORT TERM GOAL #5   Title Patient will improve fine motor coordination by improving ability to complete the nine hole peg test in 25" or less.   Time 4   Period Weeks   Status Achieved           OT Long Term Goals - 06/08/15 1031    OT LONG TERM GOAL #1   Title Patient will be able to complete all B/IADLs and leisure activities  using  LUE as he did prior to injury.   Time 8   Period Weeks   Status On-going   OT LONG TERM GOAL #2   Title Patient will have WNL AROM in his LUE for increased ability to reach overhead and behind his back.   Time 8   Period Weeks   Status On-going   OT LONG TERM GOAL #3   Title Patient will have 4+/5 strength in his LUE in order to pick up gardening tools.   Time 8   Period Weeks   Status Partially Met   OT LONG TERM GOAL #4   Title Patient will have [facility] for increased ability to pick up coins by decreasing completion time on Nine Hole Peg Test to 21 seconds or less.   Time 8   Period Weeks   Status Achieved   OT LONG TERM GOAL #5   Title Patient will increase left grip strength by 40 pounds and left pinch strength b 8 pounds for increased ability to open containers.    Time 8   Period Weeks   Status On-going               Plan - 06/08/15 1037    Clinical Impression Statement A:  Patient has met 4/5 short term goals and is progressing towards long term goals.  His shoulder range of motion and strength has not improved significantly and he has increased pain in his left arm and cervical region, which is limiting his participation in ADLs.  However, his elbow, forearm, wrist and hand AROM and strength has greatly improved.     OT Frequency 2x / week   OT Duration 8 weeks   OT Treatment/Interventions Self-care/ADL training;Therapeutic exercise;Neuromuscular education;Manual Therapy;Energy conservation;Therapeutic activities;Splinting   Plan P:  Patient will benefit from continued skilled OT intervention to improve shoulder AROM, strength, hand strength needed to return to prior level of independence with ADLs.  Will await word from patient regarding his ability to continue therapy based on insurance coverage.    Consulted and Agree with Plan of Care Patient        Problem List Patient Active Problem List   Diagnosis Date Noted  . Myelopathy, spondylogenic, cervical  04/11/2015  . Essential hypertension, benign 03/17/2014  . Dysphagia, unspecified(787.20) 03/17/2014  . High cholesterol 03/17/2014    Vangie Bicker, OTR/L [phone]  06/08/2015, 10:41 AM  [facility] [address] [place], [place], [address] Phone: [phone]   Fax:  [phone]  Occupational Therapy Progress Note  Dates of Reporting Period: 04/23/15 to 06/08/15  Objective Reports of Subjective Statement: this pain is getting to me.  Its been really bad the past few days.  Objective Measurements: see above Goal Update: see above  Plan: see above  Reason Skilled Services are Required: Patient continues to have deficits in AROM, strength, and normal movement patterns in his left arm due to increased pain and decreased innervation to his LUE.    Vangie Bicker, OTR/L [phone]

## 2016-02-27 ENCOUNTER — Encounter (HOSPITAL_COMMUNITY): Payer: Self-pay

## 2016-02-27 NOTE — Therapy (Signed)
[place], [place], [address] Phone: [phone]   Fax:  [phone]  Patient Details  Name: Xavier Livingston MRN: [id number] Date of Birth: 1956-05-10 Referring Provider:  No ref. provider found  Encounter Date: 02/27/2016  OCCUPATIONAL THERAPY DISCHARGE SUMMARY  Visits from Start of Care: 4  Current functional level related to goals / functional outcomes: OT LONG TERM GOAL #1    Title Patient will be able to complete all B/IADLs and leisure activities using LUE as he did prior to injury.   Time 8   Period Weeks   Status On-going   OT LONG TERM GOAL #2   Title Patient will have WNL AROM in his LUE for increased ability to reach overhead and behind his back.   Time 8   Period Weeks   Status On-going   OT LONG TERM GOAL #3   Title Patient will have 4+/5 strength in his LUE in order to pick up gardening tools.   Time 8   Period Weeks   Status Partially Met   OT LONG TERM GOAL #4   Title Patient will have [facility] for increased ability to pick up coins by decreasing completion time on Nine Hole Peg Test to 21 seconds or less.   Time 8   Period Weeks   Status Achieved   OT LONG TERM GOAL #5   Title Patient will increase left grip strength by 40 pounds and left pinch strength b 8 pounds for increased ability to open containers.    Time 8   Period Weeks   Status On-going          Patient did not return to therapy clinic after 4th visit. Discharged due to failure to return.   Plan: Patient agrees to discharge.  Patient goals were partially met. Patient is being discharged due to not returning since the last visit.  ?????       Ailene Ravel, OTR/L,CBIS  [phone]  02/27/2016, 1:28 PM  [place] [facility] [address] [place], [place], [address] Phone: [phone]   Fax:  [phone]

## 2016-09-14 ENCOUNTER — Encounter (INDEPENDENT_AMBULATORY_CARE_PROVIDER_SITE_OTHER): Payer: Self-pay | Admitting: *Deleted

## 2016-09-14 ENCOUNTER — Encounter (INDEPENDENT_AMBULATORY_CARE_PROVIDER_SITE_OTHER): Payer: Self-pay | Admitting: Internal Medicine

## 2016-09-14 ENCOUNTER — Ambulatory Visit (INDEPENDENT_AMBULATORY_CARE_PROVIDER_SITE_OTHER): Payer: Medicaid Other | Admitting: Internal Medicine

## 2016-09-14 VITALS — BP 118/66 | HR 80 | Temp 98.3°F | Ht 71.0 in | Wt 168.7 lb

## 2016-09-14 DIAGNOSIS — R1313 Dysphagia, pharyngeal phase: Secondary | ICD-10-CM

## 2016-09-14 NOTE — Progress Notes (Addendum)
Subjective:    Patient ID: Xavier Livingston, male    DOB: [birth year], 60 y.o.   MRN: [id number]  HPI Presents today with c/o dysphagia.  He says when he drinks or eats something he has to hit his chest for the food to go down. Sometimes he can belch, and the food will go down. Symptoms for 4-5 years. Worse in last 2 weeks.  Appetite is okay. He has lost 15 pounds since May of last year. States had neck surgery in May of last year. Has a BM daily. No melena or BRRB. Rarely has acid reflux.  No ASA.  Last EGD in 2015 (see below) 08/18/2014:    Colonoscopy followed by EGD and ED.  Indications:  Patient is a 60 year old male who is here for average risk screening colonoscopy. He also complains of worsening dysphagia primarily to liquids but also to solids. He underwent barium pill esophagogram in April of this year and was unremarkable other than question of a small Zenker's diverticulum. He is therefore also undergoing EGD and possible ED.                      Colonoscopy findings; External hemorrhoids otherwise normal colonoscopy.  EGD findings; Small sliding hiatal hernia without ring or stricture formation. Nonerosive antral gastritis. Esophagus dilated by passing 56 French Maloney dilator but no mucosal disruption induced.  Review of Systems Past Medical History:  Diagnosis Date  . Arthritis   . Dysrhythmia    irregular heartbeat in the 80's  . Enlarged prostate   . GERD (gastroesophageal reflux disease)   . Headache   . Hyperlipemia   . Hypertension   . Nocturia more than twice per night   . Smoker within last 12 months     Past Surgical History:  Procedure Laterality Date  . ANTERIOR CERVICAL DECOMPRESSION/DISCECTOMY FUSION 4 LEVELS N/A 04/11/2015   Procedure: Anterior Cervical Discectomy/ Decompression Fusion Cervical three-four, Cervical four-five, Cervical five-six, Cervical six- seven;  Surgeon: Kary Kos, MD;  Location: [facility];  Service: Neurosurgery;   Laterality: N/A;  . COLONOSCOPY N/A 08/18/2014   Procedure: COLONOSCOPY;  Surgeon: Rogene Houston, MD;  [facility];  Service: Endoscopy;  Laterality: N/A;  1030-moved to 1200 Ann to notify pt  . ESOPHAGOGASTRODUODENOSCOPY N/A 08/18/2014   Procedure: ESOPHAGOGASTRODUODENOSCOPY (EGD);  Surgeon: Rogene Houston, MD;  [facility];  Service: Endoscopy;  Laterality: N/A;  . excision of mass of back    . MALONEY DILATION  08/18/2014   Procedure: MALONEY DILATION;  Surgeon: Rogene Houston, MD;  [facility];  Service: Endoscopy;;  . MASS EXCISION N/A 10/21/2013   Procedure: EXCISION MASSES X 2 ON BACK;  Surgeon: Scherry Ran, MD;  Location: [facility];  Service: General;  Laterality: N/A;  . NASAL SEPTUM SURGERY      No Known Allergies  Current Outpatient Prescriptions on File Prior to Visit  Medication Sig Dispense Refill  . amLODipine (NORVASC) 5 MG tablet Take 5 mg by mouth daily.     [name] HYDROcodone-acetaminophen (NORCO/VICODIN) 5-325 MG per tablet Take 1 tablet by mouth every 6 (six) hours as needed. For pain    . pravastatin (PRAVACHOL) 40 MG tablet Take 40 mg by mouth daily.    . silodosin (RAPAFLO) 8 MG CAPS capsule Take 8 mg by mouth daily with breakfast.     No current facility-administered medications on file prior to visit.  Objective:   Physical Exam Blood pressure 118/66, pulse 80, temperature 98.3 F (36.8 C), height 5\' 11"  (1.803 m), weight 168 lb 11.2 oz (76.5 kg).   Alert and oriented. Skin warm and dry. Oral mucosa is moist.   . Sclera anicteric, conjunctivae is pink. Thyroid not enlarged. No cervical lymphadenopathy. Lungs clear. Heart regular rate and rhythm.  Abdomen is soft. Bowel sounds are positive. No hepatomegaly. No abdominal masses felt. No tenderness.  No edema to lower extremities.  .      Assessment & Plan:  Dysphagia. Am going to get an Esophagram. Further recommendations to follow.

## 2016-09-14 NOTE — Patient Instructions (Addendum)
Esophagram.  

## 2016-09-18 ENCOUNTER — Telehealth (INDEPENDENT_AMBULATORY_CARE_PROVIDER_SITE_OTHER): Payer: Self-pay | Admitting: Internal Medicine

## 2016-09-18 ENCOUNTER — Other Ambulatory Visit (INDEPENDENT_AMBULATORY_CARE_PROVIDER_SITE_OTHER): Payer: Self-pay | Admitting: Internal Medicine

## 2016-09-18 ENCOUNTER — Ambulatory Visit (HOSPITAL_COMMUNITY)
Admission: RE | Admit: 2016-09-18 | Discharge: 2016-09-18 | Disposition: A | Discharge status: Home / Self Care | Payer: Medicaid Other | Source: Ambulatory Visit | Attending: Internal Medicine | Admitting: Internal Medicine

## 2016-09-18 ENCOUNTER — Encounter (INDEPENDENT_AMBULATORY_CARE_PROVIDER_SITE_OTHER): Payer: Self-pay | Admitting: *Deleted

## 2016-09-18 DIAGNOSIS — R1319 Other dysphagia: Secondary | ICD-10-CM

## 2016-09-18 DIAGNOSIS — R1313 Dysphagia, pharyngeal phase: Secondary | ICD-10-CM | POA: Diagnosis not present

## 2016-09-18 DIAGNOSIS — K222 Esophageal obstruction: Secondary | ICD-10-CM | POA: Diagnosis not present

## 2016-09-18 DIAGNOSIS — R1310 Dysphagia, unspecified: Secondary | ICD-10-CM

## 2016-09-18 NOTE — Telephone Encounter (Signed)
EGD/ED sch'd 11/29/16, patient wants to know if there is any medicine or anything he can do until then. I did tell him if I got a cancellation I would call him

## 2016-09-18 NOTE — Telephone Encounter (Signed)
Ann, EGD/ED/ 

## 2016-09-18 NOTE — Telephone Encounter (Signed)
Xavier Livingston, EGD/ED/ 

## 2016-09-19 ENCOUNTER — Telehealth (INDEPENDENT_AMBULATORY_CARE_PROVIDER_SITE_OTHER): Payer: Self-pay | Admitting: Internal Medicine

## 2016-09-19 DIAGNOSIS — R1310 Dysphagia, unspecified: Secondary | ICD-10-CM | POA: Insufficient documentation

## 2016-09-19 DIAGNOSIS — R1319 Other dysphagia: Secondary | ICD-10-CM | POA: Insufficient documentation

## 2016-09-19 DIAGNOSIS — K219 Gastro-esophageal reflux disease without esophagitis: Secondary | ICD-10-CM

## 2016-09-19 MED ORDER — OMEPRAZOLE 40 MG PO CPDR: 40.0000 mg | DELAYED_RELEASE_CAPSULE | Freq: Every day | ORAL | 3 refills | Status: DC

## 2016-09-19 NOTE — Telephone Encounter (Signed)
Rx

## 2016-09-19 NOTE — Telephone Encounter (Signed)
Rx for [facility] sent to his pharmacy

## 2016-11-29 ENCOUNTER — Ambulatory Visit (HOSPITAL_COMMUNITY)
Admission: RE | Admit: 2016-11-29 | Discharge: 2016-11-29 | Disposition: A | Discharge status: Home / Self Care | Payer: Medicaid Other | Source: Ambulatory Visit | Attending: Internal Medicine | Admitting: Internal Medicine

## 2016-11-29 ENCOUNTER — Encounter (HOSPITAL_COMMUNITY)
Admission: RE | Disposition: A | Discharge status: Home / Self Care | Payer: Self-pay | Source: Ambulatory Visit | Attending: Internal Medicine

## 2016-11-29 ENCOUNTER — Encounter (HOSPITAL_COMMUNITY): Payer: Self-pay | Admitting: *Deleted

## 2016-11-29 DIAGNOSIS — K2270 Barrett's esophagus without dysplasia: Secondary | ICD-10-CM | POA: Insufficient documentation

## 2016-11-29 DIAGNOSIS — F17210 Nicotine dependence, cigarettes, uncomplicated: Secondary | ICD-10-CM | POA: Insufficient documentation

## 2016-11-29 DIAGNOSIS — I10 Essential (primary) hypertension: Secondary | ICD-10-CM | POA: Diagnosis not present

## 2016-11-29 DIAGNOSIS — R1310 Dysphagia, unspecified: Secondary | ICD-10-CM | POA: Insufficient documentation

## 2016-11-29 DIAGNOSIS — E785 Hyperlipidemia, unspecified: Secondary | ICD-10-CM | POA: Insufficient documentation

## 2016-11-29 DIAGNOSIS — K449 Diaphragmatic hernia without obstruction or gangrene: Secondary | ICD-10-CM | POA: Insufficient documentation

## 2016-11-29 DIAGNOSIS — N400 Enlarged prostate without lower urinary tract symptoms: Secondary | ICD-10-CM | POA: Diagnosis not present

## 2016-11-29 DIAGNOSIS — K228 Other specified diseases of esophagus: Secondary | ICD-10-CM | POA: Diagnosis not present

## 2016-11-29 DIAGNOSIS — K210 Gastro-esophageal reflux disease with esophagitis: Secondary | ICD-10-CM | POA: Diagnosis not present

## 2016-11-29 DIAGNOSIS — Z79899 Other long term (current) drug therapy: Secondary | ICD-10-CM | POA: Diagnosis not present

## 2016-11-29 DIAGNOSIS — R1319 Other dysphagia: Secondary | ICD-10-CM

## 2016-11-29 DIAGNOSIS — R1314 Dysphagia, pharyngoesophageal phase: Secondary | ICD-10-CM | POA: Diagnosis not present

## 2016-11-29 HISTORY — PX: ESOPHAGOGASTRODUODENOSCOPY: SHX5428

## 2016-11-29 HISTORY — PX: ESOPHAGEAL DILATION: SHX303

## 2016-11-29 SURGERY — EGD (ESOPHAGOGASTRODUODENOSCOPY)
Anesthesia: Moderate Sedation

## 2016-11-29 MED ORDER — MEPERIDINE HCL 50 MG/ML IJ SOLN
INTRAMUSCULAR | Status: DC | PRN
  Administered 2016-11-29 (×2): 25 mg via INTRAVENOUS

## 2016-11-29 MED ORDER — MEPERIDINE HCL 50 MG/ML IJ SOLN
INTRAMUSCULAR | Status: AC
  Filled 2016-11-29: qty 1

## 2016-11-29 MED ORDER — MIDAZOLAM HCL 5 MG/5ML IJ SOLN
INTRAMUSCULAR | Status: AC
  Filled 2016-11-29: qty 10

## 2016-11-29 MED ORDER — MIDAZOLAM HCL 5 MG/5ML IJ SOLN
INTRAMUSCULAR | Status: DC | PRN
  Administered 2016-11-29: 3 mg via INTRAVENOUS
  Administered 2016-11-29: 1 mg via INTRAVENOUS
  Administered 2016-11-29 (×3): 2 mg via INTRAVENOUS

## 2016-11-29 MED ORDER — SODIUM CHLORIDE 0.9 % IV SOLN
INTRAVENOUS | Status: DC
  Administered 2016-11-29: 12:00:00 via INTRAVENOUS

## 2016-11-29 MED ORDER — PANTOPRAZOLE SODIUM 40 MG PO TBEC: 40.0000 mg | DELAYED_RELEASE_TABLET | Freq: Two times a day (BID) | ORAL | 2 refills | Status: DC

## 2016-11-29 MED ORDER — STERILE WATER FOR IRRIGATION IR SOLN
Status: DC | PRN
  Administered 2016-11-29: 2.5 mL

## 2016-11-29 MED ORDER — PROMETHAZINE HCL 25 MG/ML IJ SOLN
INTRAMUSCULAR | Status: DC | PRN
  Administered 2016-11-29 (×2): 6.25 mg via INTRAVENOUS

## 2016-11-29 MED ORDER — PROMETHAZINE HCL 25 MG/ML IJ SOLN
INTRAMUSCULAR | Status: AC
  Filled 2016-11-29: qty 1

## 2016-11-29 NOTE — Discharge Instructions (Signed)
Discontinue omeprazole. Begin pantoprazole 40 mg by mouth 30 minutes before breakfast and evening meal daily. Resume other medications as before. Resume usual diet. No driving for 24 hours. Physician will call with biopsy results.  Esophagogastroduodenoscopy, Care After Introduction Refer to this sheet in the next few weeks. These instructions provide you with information about caring for yourself after your procedure. Your health care provider may also give you more specific instructions. Your treatment has been planned according to current medical practices, but problems sometimes occur. Call your health care provider if you have any problems or questions after your procedure. Dr Laural Golden [phone].  After hours call the hospital and have the doctor on call paged What can I expect after the procedure? After the procedure, it is common to have:  A sore throat.  Nausea.  Bloating.  Dizziness.  Fatigue. Follow these instructions at home:  Do not eat or drink anything until the numbing medicine (local anesthetic) has worn off and your gag reflex has returned. You will know that the local anesthetic has worn off when you can swallow comfortably.  Do not drive for 24 hours if you received a medicine to help you relax (sedative).  If your health care provider took a tissue sample for testing during the procedure, make sure to get your test results. This is your responsibility. Ask your health care provider or the department performing the test when your results will be ready.  Keep all follow-up visits as told by your health care provider. This is important. Contact a health care provider if:  You cannot stop coughing.  You are not urinating.  You are urinating less than usual. Get help right away if:  You have trouble swallowing.  You cannot eat or drink.  You have throat or chest pain that gets worse.  You are dizzy or light-headed.  You faint.  You have nausea or  vomiting.  You have chills.  You have a fever.  You have severe abdominal pain.  You have black, tarry, or bloody stools. This information is not intended to replace advice given to you by your health care provider. Make sure you discuss any questions you have with your health care provider. Document Released: 11/05/2012 Document Revised: 04/26/2016 Document Reviewed: 10/13/2015  2017 Elsevier

## 2016-11-29 NOTE — H&P (Signed)
Xavier Livingston is an 60 y.o. male.   Chief Complaint: Patient is here for EGD and ED. HPI: Ration is 60 year old African-American male was chronic GERD who now presents with dysphagia to solids. He points to mid sternal area as well as supra sternal areas site of bolus obstruction. He has difficulty with solids as well as liquids. He was last evaluated over 2 years ago. Esophagus was normal. Esophagus was dilated providing temporary relief. He then went on to have pH study an esophageal manometry at [facility]. Acid reflux control is satisfactory and manometry was normal. He is not having frequent heartburn and nocturnal regurgitation. He states omeprazole is not working anymore. He denies nausea vomiting and epigastric pain or melena. He is doing his best to quit cigarette smoking. He is down to 2 cigarettes per day. He had barium pill study on 09/18/2016 which suggested distal esophageal narrowing. Barium pill passed this area slowly. He says he passed out 3 weeks ago while he was talking to a friend. He did not come to emergency room or talk with Dr. Lorriane Shire. He finally has made an appointment to see him.  Past Medical History:  Diagnosis Date  . Arthritis   . Dysrhythmia    irregular heartbeat in the 80's  . Enlarged prostate   . GERD (gastroesophageal reflux disease)   . Headache   . Hyperlipemia   . Hypertension   . Nocturia more than twice per night   . Smoker within last 12 months     Past Surgical History:  Procedure Laterality Date  . ANTERIOR CERVICAL DECOMPRESSION/DISCECTOMY FUSION 4 LEVELS N/A 04/11/2015   Procedure: Anterior Cervical Discectomy/ Decompression Fusion Cervical three-four, Cervical four-five, Cervical five-six, Cervical six- seven;  Surgeon: Kary Kos, MD;  Location: [place] [facility];  Service: Neurosurgery;  Laterality: N/A;  . COLONOSCOPY N/A 08/18/2014   Procedure: COLONOSCOPY;  Surgeon: Rogene Houston, MD;  [facility];  Service: Endoscopy;   Laterality: N/A;  1030-moved to 1200 Ann to notify pt  . ESOPHAGOGASTRODUODENOSCOPY N/A 08/18/2014   Procedure: ESOPHAGOGASTRODUODENOSCOPY (EGD);  Surgeon: Rogene Houston, MD;  [facility];  Service: Endoscopy;  Laterality: N/A;  . excision of mass of back    . MALONEY DILATION  08/18/2014   Procedure: MALONEY DILATION;  Surgeon: Rogene Houston, MD;  [facility];  Service: Endoscopy;;  . MASS EXCISION N/A 10/21/2013   Procedure: EXCISION MASSES X 2 ON BACK;  Surgeon: Scherry Ran, MD;  Location: [facility];  Service: General;  Laterality: N/A;  . NASAL SEPTUM SURGERY      History reviewed. No pertinent family history. Social History:  reports that he has been smoking Cigarettes and Cigars.  He has a 10.00 pack-year smoking history. He has never used smokeless tobacco. He reports that he drinks about 12.0 oz of alcohol per week . He reports that he uses drugs.  Allergies: No Known Allergies  Medications Prior to Admission  Medication Sig Dispense Refill  . amLODipine (NORVASC) 5 MG tablet Take 5 mg by mouth daily.     [name] omeprazole (PRILOSEC) 40 MG capsule Take 1 capsule (40 mg total) by mouth daily. 90 capsule 3  . Oxycodone HCl 10 MG TABS Take 10 mg by mouth 2 (two) times daily as needed (pain).    . pravastatin (PRAVACHOL) 40 MG tablet Take 40 mg by mouth daily.    . silodosin (RAPAFLO) 8 MG CAPS capsule Take 8 mg by  mouth daily with breakfast.      No results found for this or any previous visit (from the past 48 hour(s)). No results found.  ROS  Blood pressure (!) 149/85, pulse 74, temperature 98.6 F (37 C), temperature source Oral, resp. rate 17, height 5\' 11"  (1.803 m), weight 168 lb (76.2 kg), SpO2 100 %. Physical Exam  Constitutional: He appears well-developed and well-nourished.  HENT:  Mouth/Throat: Oropharynx is clear and moist.  Eyes: Conjunctivae are normal. No scleral icterus.  Neck: No thyromegaly present.  Cardiovascular: Normal rate,  regular rhythm and normal heart sounds.   No murmur heard. GI: Soft. He exhibits no distension and no mass. There is no tenderness.  Musculoskeletal: He exhibits no edema.  Lymphadenopathy:    He has no cervical adenopathy.  Neurological: He is alert.  Skin: Skin is warm and dry.     Assessment/Plan Dysphagia to solids and liquids in patient with chronic GERD. Abnormal barium pill esophagogram. EGD with ED.  Hildred Laser, MD 11/29/2016, 11:53 AM

## 2016-11-29 NOTE — Op Note (Signed)
[facility] Patient Name: Xavier Livingston Procedure Date: 11/29/2016 11:00 AM MRN: [id number] Date of Birth: [birth year] Attending MD: Hildred Laser , MD CSN: [id number] Age: 60 Admit Type: Outpatient Procedure:                Upper GI endoscopy Indications:              Esophageal dysphagia, Reflux esophagitis Providers:                Hildred Laser, MD, Rosina Lowenstein, RN, Aram Candela Referring MD:             Ralene Bathe. Dondiego, MD Medicines:                Cetacaine spray, Promethazine 12.5 mg IV, Midazolam                            12 mg IV, Meperidine 50 mg IV Complications:            No immediate complications. Estimated Blood Loss:     Estimated blood loss was minimal. Procedure:                Pre-Anesthesia Assessment:                           - Prior to the procedure, a History and Physical                            was performed, and patient medications and                            allergies were reviewed. The patient's tolerance of                            previous anesthesia was also reviewed. The risks                            and benefits of the procedure and the sedation                            options and risks were discussed with the patient.                            All questions were answered, and informed consent                            was obtained. Prior Anticoagulants: The patient has                            taken no previous anticoagulant or antiplatelet                            agents. ASA Grade Assessment: II - A patient with                            mild systemic disease. After reviewing the risks  and benefits, the patient was deemed in                            satisfactory condition to undergo the procedure.                           After obtaining informed consent, the endoscope was                            passed under direct vision. Throughout the                            procedure, the  patient's blood pressure, pulse, and                            oxygen saturations were monitored continuously. The                            [id number]) scope was introduced through the                            mouth, and advanced to the second part of duodenum.                            The upper GI endoscopy was performed with                            difficulty due to the patient's poor cooperation.                            Successful completion of the procedure was aided by                            increasing the dose of sedation medication. The                            patient tolerated the procedure fairly well. Scope In: 12:10:11 PM Scope Out: 12:27:40 PM Total Procedure Duration: 0 hours 17 minutes 29 seconds  Findings:      The examined esophagus was normal. No structural abnormality noted to       account for patient's dysphagia.      The Z-line was irregular and was found 39 cm from the incisors. Biopsies       were taken with a cold forceps for histology.      A 2 cm hiatal hernia was present.      The entire examined stomach was normal.      The duodenal bulb and second portion of the duodenum were normal. Impression:               - Normal esophagus.                           - Z-line irregular, 39 cm from the incisors.  Biopsied.                           - 2 cm hiatal hernia.                           - Normal stomach.                           - Normal duodenal bulb and second portion of the                            duodenum.                           Comment: Esophageal dilation not attempted because                            lack of cooperation. Moderate Sedation:      Moderate (conscious) sedation was administered by the endoscopy nurse       and supervised by the endoscopist. The following parameters were       monitored: oxygen saturation, heart rate, blood pressure, CO2       capnography and response to care.  Total physician intraservice time was       26 minutes. Recommendation:           - Resume previous diet today.                           - Continue present medications.                           - Discontinue omeprazole as it is not working.                           - Use Protonix (pantoprazole) 40 mg PO BID.                           - Await pathology results.                           - Return to GI clinic in 1 month.                           - Patient has a contact number available for                            emergencies. The signs and symptoms of potential                            delayed complications were discussed with the                            patient. Return to normal activities tomorrow.                            Written discharge instructions were provided  to the                            patient. Procedure Code(s):        --- Professional ---                           [phone], Esophagogastroduodenoscopy, flexible,                            transoral; with biopsy, single or multiple                           99152, Moderate sedation services provided by the                            same physician or other qualified health care                            professional performing the diagnostic or                            therapeutic service that the sedation supports,                            requiring the presence of an independent trained                            observer to assist in the monitoring of the                            patient's level of consciousness and physiological                            status; initial 15 minutes of intraservice time,                            patient age 38 years or older                           [phone], Moderate sedation services; each additional                            15 minutes intraservice time Diagnosis Code(s):        --- Professional ---                           K22.8, Other specified diseases of  esophagus                           K44.9, Diaphragmatic hernia without obstruction or                            gangrene                           R13.14,  Dysphagia, pharyngoesophageal phase                           K21.0, Gastro-esophageal reflux disease with                            esophagitis CPT copyright 2016 American [employer]. All rights reserved. The codes documented in this report are preliminary and upon coder review may  be revised to meet current compliance requirements. Hildred Laser, MD Hildred Laser, MD 11/29/2016 12:42:15 PM This report has been signed electronically. Number of Addenda: 0

## 2016-11-30 ENCOUNTER — Telehealth (INDEPENDENT_AMBULATORY_CARE_PROVIDER_SITE_OTHER): Payer: Self-pay | Admitting: *Deleted

## 2016-11-30 NOTE — Telephone Encounter (Signed)
Per OP note patient needs OV 1 month

## 2016-12-04 ENCOUNTER — Encounter (HOSPITAL_COMMUNITY): Payer: Self-pay | Admitting: Internal Medicine

## 2016-12-05 ENCOUNTER — Encounter (INDEPENDENT_AMBULATORY_CARE_PROVIDER_SITE_OTHER): Payer: Self-pay | Admitting: Internal Medicine

## 2016-12-05 NOTE — Telephone Encounter (Signed)
Patient was given an appointment with Deberah Castle, NP on 01/07/17 at 10:15am.  A letter was mailed to the patient.

## 2017-01-07 ENCOUNTER — Encounter (INDEPENDENT_AMBULATORY_CARE_PROVIDER_SITE_OTHER): Payer: Self-pay | Admitting: Internal Medicine

## 2017-01-07 ENCOUNTER — Ambulatory Visit (INDEPENDENT_AMBULATORY_CARE_PROVIDER_SITE_OTHER): Payer: Medicaid Other | Admitting: Internal Medicine

## 2017-01-07 VITALS — BP 144/70 | HR 64 | Temp 97.8°F | Ht 71.0 in | Wt 171.0 lb

## 2017-01-07 DIAGNOSIS — R1310 Dysphagia, unspecified: Secondary | ICD-10-CM

## 2017-01-07 DIAGNOSIS — R1319 Other dysphagia: Secondary | ICD-10-CM

## 2017-01-07 NOTE — Progress Notes (Signed)
Subjective:    Patient ID: Xavier Livingston, male    DOB: 06/15/1956, 61 y.o.   MRN: [id number]  HPI Here today for f/u. Had EGD in December.He tells me he is not doing good. He says the ED helped for a while. He is still having dysphagia, but not as bad.  His appetite is okay. He has gained 3 pounds since his visit in October. He is eating 2 meals a day. He says avoids steaks.  Takes Protonix 40mg  daily.  He is not gagging on his foods. Takes Oxycodone daily for chronic neck pain. Does not take everyday.  Apparently he was hard to sedate during EGD.   11/29/2016 EGD/ED: dysphagia:  Impression:               - Normal esophagus.                           - Z-line irregular, 39 cm from the incisors.                            Biopsied.                           - 2 cm hiatal hernia.                           - Normal stomach.                           - Normal duodenal bulb and second portion of the                            duodenum.                           Comment: Esophageal dilation not attempted because                            lack of cooperation  Biopsy from GE junction negative for Barrett's esophagus but shows pancreatic acinar cell metaplasia/heterotopia. Results reviewed with patient. He states medication is working for GERD. He reminded me that previous     08/18/2014: Colonoscopy followed by EGD and ED.  Indications:Patient is a 61 year old male who is here for average risk screening colonoscopy. He also complains of worsening dysphagia primarily to liquids but also to solids. He underwent barium pill esophagogram in April of this year and was unremarkable other than question of a small Zenker's diverticulum. He is therefore also undergoing EGD and possible ED.  Colonoscopy findings; External hemorrhoids otherwise normal colonoscopy.  EGD findings; Small sliding hiatal hernia without ring or stricture formation. Nonerosive antral  gastritis. Esophagus dilated by passing 56 French Maloney dilator but no mucosal disruption induced.    Review of Systems Past Medical History:  Diagnosis Date  . Arthritis   . Dysrhythmia    irregular heartbeat in the 80's  . Enlarged prostate   . GERD (gastroesophageal reflux disease)   . Headache   . Hyperlipemia   . Hypertension   . Nocturia more than twice per night   . Smoker within last 12 months     Past Surgical History:  Procedure Laterality Date  .  ANTERIOR CERVICAL DECOMPRESSION/DISCECTOMY FUSION 4 LEVELS N/A 04/11/2015   Procedure: Anterior Cervical Discectomy/ Decompression Fusion Cervical three-four, Cervical four-five, Cervical five-six, Cervical six- seven;  Surgeon: Kary Kos, MD;  Location: [facility];  Service: Neurosurgery;  Laterality: N/A;  . COLONOSCOPY N/A 08/18/2014   Procedure: COLONOSCOPY;  Surgeon: Rogene Houston, MD;  Location: [facility];  Service: Endoscopy;  Laterality: N/A;  1030-moved to 1200 Ann to notify pt  . ESOPHAGEAL DILATION N/A 11/29/2016   Procedure: ESOPHAGEAL DILATION;  Surgeon: Rogene Houston, MD;  Location: [facility];  Service: Endoscopy;  Laterality: N/A;  . ESOPHAGOGASTRODUODENOSCOPY N/A 08/18/2014   Procedure: ESOPHAGOGASTRODUODENOSCOPY (EGD);  Surgeon: Rogene Houston, MD;  Location: [facility];  Service: Endoscopy;  Laterality: N/A;  . ESOPHAGOGASTRODUODENOSCOPY N/A 11/29/2016   Procedure: ESOPHAGOGASTRODUODENOSCOPY (EGD);  Surgeon: Rogene Houston, MD;  Location: [facility];  Service: Endoscopy;  Laterality: N/A;  12:00  . excision of mass of back    . MALONEY DILATION  08/18/2014   Procedure: MALONEY DILATION;  Surgeon: Rogene Houston, MD;  Location: [facility];  Service: Endoscopy;;  . MASS EXCISION N/A 10/21/2013   Procedure: EXCISION MASSES X 2 ON BACK;  Surgeon: Scherry Ran, MD;  Location: [facility];  Service: General;  Laterality: N/A;  . NASAL SEPTUM SURGERY      No Known Allergies  Current  Outpatient Prescriptions on File Prior to Visit  Medication Sig Dispense Refill  . amLODipine (NORVASC) 5 MG tablet Take 5 mg by mouth daily.     . Oxycodone HCl 10 MG TABS Take 10 mg by mouth 2 (two) times daily as needed (pain).    . pantoprazole (PROTONIX) 40 MG tablet Take 1 tablet (40 mg total) by mouth 2 (two) times daily before a meal. 60 tablet 2  . pravastatin (PRAVACHOL) 40 MG tablet Take 40 mg by mouth daily.     No current facility-administered medications on file prior to visit.        Objective:   Physical Exam Blood pressure (!) 144/70, pulse 64, temperature 97.8 F (36.6 C), height 5\' 11"  (1.803 m), weight 171 lb (77.6 kg).  Alert and oriented. Skin warm and dry. Oral mucosa is moist.   . Sclera anicteric, conjunctivae is pink. Thyroid not enlarged. No cervical lymphadenopathy. Lungs clear. Heart regular rate and rhythm.  Abdomen is soft. Bowel sounds are positive. No hepatomegaly. No abdominal masses felt. No tenderness.  No edema to lower extremities.        Assessment & Plan:  Dysphagia. Continue the Protonix. I will discuss with Dr Laural Golden.

## 2017-01-07 NOTE — Patient Instructions (Addendum)
OV in 3 months with Dr. Rehman 

## 2017-02-26 ENCOUNTER — Ambulatory Visit (HOSPITAL_COMMUNITY)
Admission: RE | Admit: 2017-02-26 | Discharge: 2017-02-26 | Disposition: A | Discharge status: Home / Self Care | Payer: Medicaid Other | Source: Ambulatory Visit | Attending: Family Medicine | Admitting: Family Medicine

## 2017-02-26 ENCOUNTER — Other Ambulatory Visit (HOSPITAL_COMMUNITY): Payer: Self-pay | Admitting: Family Medicine

## 2017-02-26 DIAGNOSIS — Z77090 Contact with and (suspected) exposure to asbestos: Secondary | ICD-10-CM

## 2017-04-09 ENCOUNTER — Ambulatory Visit (INDEPENDENT_AMBULATORY_CARE_PROVIDER_SITE_OTHER): Payer: Medicaid Other | Admitting: Internal Medicine

## 2017-04-17 ENCOUNTER — Other Ambulatory Visit (INDEPENDENT_AMBULATORY_CARE_PROVIDER_SITE_OTHER): Payer: Self-pay | Admitting: Internal Medicine

## 2017-04-17 ENCOUNTER — Encounter (INDEPENDENT_AMBULATORY_CARE_PROVIDER_SITE_OTHER): Payer: Self-pay | Admitting: *Deleted

## 2017-04-17 ENCOUNTER — Ambulatory Visit (INDEPENDENT_AMBULATORY_CARE_PROVIDER_SITE_OTHER): Payer: Medicaid Other | Admitting: Internal Medicine

## 2017-04-17 ENCOUNTER — Encounter (INDEPENDENT_AMBULATORY_CARE_PROVIDER_SITE_OTHER): Payer: Self-pay | Admitting: Internal Medicine

## 2017-04-17 VITALS — BP 122/70 | HR 68 | Temp 98.1°F | Ht 71.0 in | Wt 162.8 lb

## 2017-04-17 DIAGNOSIS — K219 Gastro-esophageal reflux disease without esophagitis: Secondary | ICD-10-CM

## 2017-04-17 DIAGNOSIS — R1310 Dysphagia, unspecified: Secondary | ICD-10-CM

## 2017-04-17 DIAGNOSIS — R1319 Other dysphagia: Secondary | ICD-10-CM

## 2017-04-17 NOTE — Progress Notes (Addendum)
Subjective:    Patient ID: Xavier Livingston, male    DOB: 07/19/1956, 61 y.o.   MRN: [id number]  HPI Here today for f/u. Hx of dysphagia and underwent an EGD/ED 11/29/2016.  Last seen in February of this year. He tells me he is not doing well. 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 He says his left arm is numb.  He says it hurts to swallow.  Foods are lodging in his esophagus he states. His last EGD he was not dilated.  Last weight was 171 Today his weight is 162.8.    11/29/2016 EGD/ED: dysphagia:  Impression: - Normal esophagus. - Z-line irregular, 39 cm from the incisors.  Biopsied. - 2 cm hiatal hernia. - Normal stomach. - Normal duodenal bulb and second portion of the  duodenum. Comment: Esophageal dilation not attempted because  lack of cooperation  Biopsy from GE junction negative for Barrett's esophagus but shows pancreatic acinar cell metaplasia/heterotopia. Results reviewed with patient. He states medication is working for GERD. He reminded me that previous    08/18/2014: Colonoscopy followed by EGD and ED.  Indications:Patient is a 61 year old male who is here for average risk screening colonoscopy. He also complains of worsening dysphagia primarily to liquids but also to solids. He underwent barium pill esophagogram in April of this year and was unremarkable other than question of a small Zenker's diverticulum. He is therefore also undergoing EGD and possible ED. Colonoscopy findings; External hemorrhoids otherwise normal colonoscopy.  EGD findings; Small sliding hiatal hernia without ring or stricture formation. Nonerosive antral gastritis. Esophagus dilated by passing 56 French Maloney dilator but no mucosal disruption  induced.    Review of Systems Past Medical History:  Diagnosis Date  . Arthritis   . Dysrhythmia    irregular heartbeat in the 80's  . Enlarged prostate   . GERD (gastroesophageal reflux disease)   . Headache   . Hyperlipemia   . Hypertension   . Nocturia more than twice per night   . Smoker within last 12 months     Past Surgical History:  Procedure Laterality Date  . ANTERIOR CERVICAL DECOMPRESSION/DISCECTOMY FUSION 4 LEVELS N/A 04/11/2015   Procedure: Anterior Cervical Discectomy/ Decompression Fusion Cervical three-four, Cervical four-five, Cervical five-six, Cervical six- seven;  Surgeon: Kary Kos, MD;  Location: [facility];  Service: Neurosurgery;  Laterality: N/A;  . COLONOSCOPY N/A 08/18/2014   Procedure: COLONOSCOPY;  Surgeon: Rogene Houston, MD;  [facility]: [facility];  Service: Endoscopy;  Laterality: N/A;  1030-moved to 1200 Ann to notify pt  . ESOPHAGEAL DILATION N/A 11/29/2016   Procedure: ESOPHAGEAL DILATION;  Surgeon: Rogene Houston, MD;  Location: [facility];  Service: Endoscopy;  Laterality: N/A;  . ESOPHAGOGASTRODUODENOSCOPY N/A 08/18/2014   Procedure: ESOPHAGOGASTRODUODENOSCOPY (EGD);  Surgeon: Rogene Houston, MD;  [facility]: [facility];  Service: Endoscopy;  Laterality: N/A;  . ESOPHAGOGASTRODUODENOSCOPY N/A 11/29/2016   Procedure: ESOPHAGOGASTRODUODENOSCOPY (EGD);  Surgeon: Rogene Houston, MD;  Location: [facility];  Service: Endoscopy;  Laterality: N/A;  12:00  . excision of mass of back    . MALONEY DILATION  08/18/2014   Procedure: MALONEY DILATION;  Surgeon: Rogene Houston, MD;  Location: [facility];  Service: Endoscopy;;  . MASS EXCISION N/A 10/21/2013   Procedure: EXCISION MASSES X 2 ON BACK;  Surgeon: Scherry Ran, MD;  Location: [facility];  Service: General;  Laterality: N/A;  . NASAL SEPTUM SURGERY      No Known  Allergies  Current Outpatient Prescriptions on File Prior to Visit  Medication Sig Dispense Refill  . amLODipine  (NORVASC) 5 MG tablet Take 5 mg by mouth daily.     . Oxycodone HCl 10 MG TABS Take 10 mg by mouth 2 (two) times daily as needed (pain).    . pantoprazole (PROTONIX) 40 MG tablet Take 1 tablet (40 mg total) by mouth 2 (two) times daily before a meal. 60 tablet 2  . pravastatin (PRAVACHOL) 40 MG tablet Take 40 mg by mouth daily.     No current facility-administered medications on file prior to visit.        Objective:   Physical Exam Blood pressure 122/70, pulse 68, temperature 98.1 F (36.7 C), height 5\' 11"  (1.803 m), weight 162 lb 12.8 oz (73.8 kg).  Alert and oriented. Skin warm and dry. Oral mucosa is moist.   . Sclera anicteric, conjunctivae is pink. Thyroid not enlarged. No cervical lymphadenopathy. Lungs clear. Heart regular rate and rhythm.  Abdomen is soft. Bowel sounds are positive. No hepatomegaly. No abdominal masses felt. No tenderness.  No edema to lower extremities.        Assessment & Plan:  Dysphagia. EGD/ED. I discussed with Dr. Laural Golden.  GERD controlled with Protonix.

## 2017-04-17 NOTE — Patient Instructions (Signed)
EGD/ED.

## 2017-05-08 NOTE — Patient Instructions (Signed)
Xavier Livingston  05/08/2017     @PREFPERIOPPHARMACY @   Your procedure is scheduled on  05/16/2017   Report to [facility] at  700  A.M.  Call this number if you have problems the morning of surgery:  [phone]   Remember:  Do not eat food or drink liquids after midnight.  Take these medicines the morning of surgery with A SIP OF WATER  Norvasc, protonix, oxycodone.   Do not wear jewelry, make-up or nail polish.  Do not wear lotions, powders, or perfumes, or deoderant.  Do not shave 48 hours prior to surgery.  Men may shave face and neck.  Do not bring valuables to the hospital.  [facility] is not responsible for any belongings or valuables.  Contacts, dentures or bridgework may not be worn into surgery.  Leave your suitcase in the car.  After surgery it may be brought to your room.  For patients admitted to the hospital, discharge time will be determined by your treatment team.  Patients discharged the day of surgery will not be allowed to drive home.   Name and phone number of your driver:   family Special instructions:  Follow the diet instructions given to you by Dr Olevia Perches office.  Please read over the following fact sheets that you were given. Anesthesia Post-op Instructions and Care and Recovery After Surgery       Esophagogastroduodenoscopy Esophagogastroduodenoscopy (EGD) is a procedure to examine the lining of the esophagus, stomach, and first part of the small intestine (duodenum). This procedure is done to check for problems such as inflammation, bleeding, ulcers, or growths. During this procedure, a long, flexible, lighted tube with a camera attached (endoscope) is inserted down the throat. Tell a health care provider about:  Any allergies you have.  All medicines you are taking, including vitamins, herbs, eye drops, creams, and over-the-counter medicines.  Any problems you or family members have had with anesthetic medicines.  Any  blood disorders you have.  Any surgeries you have had.  Any medical conditions you have.  Whether you are pregnant or may be pregnant. What are the risks? Generally, this is a safe procedure. However, problems may occur, including:  Infection.  Bleeding.  A tear (perforation) in the esophagus, stomach, or duodenum.  Trouble breathing.  Excessive sweating.  Spasms of the larynx.  A slowed heartbeat.  Low blood pressure.  What happens before the procedure?  Follow instructions from your health care provider about eating or drinking restrictions.  Ask your health care provider about: ? Changing or stopping your regular medicines. This is especially important if you are taking diabetes medicines or blood thinners. ? Taking medicines such as aspirin and ibuprofen. These medicines can thin your blood. Do not take these medicines before your procedure if your health care provider instructs you not to.  Plan to have someone take you home after the procedure.  If you wear dentures, be ready to remove them before the procedure. What happens during the procedure?  To reduce your risk of infection, your health care team will wash or sanitize their hands.  An IV tube will be put in a vein in your hand or arm. You will get medicines and fluids through this tube.  You will be given one or more of the following: ? A medicine to help you relax (sedative). ? A medicine to numb the area (local anesthetic). This medicine may be  sprayed into your throat. It will make you feel more comfortable and keep you from gagging or coughing during the procedure. ? A medicine for pain.  A mouth guard may be placed in your mouth to protect your teeth and to keep you from biting on the endoscope.  You will be asked to lie on your left side.  The endoscope will be lowered down your throat into your esophagus, stomach, and duodenum.  Air will be put into the endoscope. This will help your health  care provider see better.  The lining of your esophagus, stomach, and duodenum will be examined.  Your health care provider may: ? Take a tissue sample so it can be looked at in a lab (biopsy). ? Remove growths. ? Remove objects (foreign bodies) that are stuck. ? Treat any bleeding with medicines or other devices that stop tissue from bleeding. ? Widen (dilate) or stretch narrowed areas of your esophagus and stomach.  The endoscope will be taken out. The procedure may vary among health care providers and hospitals. What happens after the procedure?  Your blood pressure, heart rate, breathing rate, and blood oxygen level will be monitored often until the medicines you were given have worn off.  Do not eat or drink anything until the numbing medicine has worn off and your gag reflex has returned. This information is not intended to replace advice given to you by your health care provider. Make sure you discuss any questions you have with your health care provider. Document Released: 03/22/2005 Document Revised: 04/26/2016 Document Reviewed: 10/13/2015 Elsevier Interactive Patient Education  2018 [employer]. Esophagogastroduodenoscopy, Care After Refer to this sheet in the next few weeks. These instructions provide you with information about caring for yourself after your procedure. Your health care provider may also give you more specific instructions. Your treatment has been planned according to current medical practices, but problems sometimes occur. Call your health care provider if you have any problems or questions after your procedure. What can I expect after the procedure? After the procedure, it is common to have:  A sore throat.  Nausea.  Bloating.  Dizziness.  Fatigue.  Follow these instructions at home:  Do not eat or drink anything until the numbing medicine (local anesthetic) has worn off and your gag reflex has returned. You will know that the local anesthetic has  worn off when you can swallow comfortably.  Do not drive for 24 hours if you received a medicine to help you relax (sedative).  If your health care provider took a tissue sample for testing during the procedure, make sure to get your test results. This is your responsibility. Ask your health care provider or the department performing the test when your results will be ready.  Keep all follow-up visits as told by your health care provider. This is important. Contact a health care provider if:  You cannot stop coughing.  You are not urinating.  You are urinating less than usual. Get help right away if:  You have trouble swallowing.  You cannot eat or drink.  You have throat or chest pain that gets worse.  You are dizzy or light-headed.  You faint.  You have nausea or vomiting.  You have chills.  You have a fever.  You have severe abdominal pain.  You have black, tarry, or bloody stools. This information is not intended to replace advice given to you by your health care provider. Make sure you discuss any questions you have with your  health care provider. Document Released: 11/05/2012 Document Revised: 04/26/2016 Document Reviewed: 10/13/2015 Elsevier Interactive Patient Education  2018 [employer].  Esophageal Dilatation Esophageal dilatation is a procedure to open a blocked or narrowed part of the esophagus. The esophagus is the long tube in your throat that carries food and liquid from your mouth to your stomach. The procedure is also called esophageal dilation. You may need this procedure if you have a buildup of scar tissue in your esophagus that makes it difficult, painful, or even impossible to swallow. This can be caused by gastroesophageal reflux disease (GERD). In rare cases, people need this procedure because they have cancer of the esophagus or a problem with the way food moves through the esophagus. Sometimes you may need to have another dilatation to enlarge the  opening of the esophagus gradually. Tell a health care provider about:  Any allergies you have.  All medicines you are taking, including vitamins, herbs, eye drops, creams, and over-the-counter medicines.  Any problems you or family members have had with anesthetic medicines.  Any blood disorders you have.  Any surgeries you have had.  Any medical conditions you have.  Any antibiotic medicines you are required to take before dental procedures. What are the risks? Generally, this is a safe procedure. However, problems can occur and include:  Bleeding from a tear in the lining of the esophagus.  A hole (perforation) in the esophagus.  What happens before the procedure?  Do not eat or drink anything after midnight on the night before the procedure or as directed by your health care provider.  Ask your health care provider about changing or stopping your regular medicines. This is especially important if you are taking diabetes medicines or blood thinners.  Plan to have someone take you home after the procedure. What happens during the procedure?  You will be given a medicine that makes you relaxed and sleepy (sedative).  A medicine may be sprayed or gargled to numb the back of the throat.  Your health care provider can use various instruments to do an esophageal dilatation. During the procedure, the instrument used will be placed in your mouth and passed down into your esophagus. Options include: ? Simple dilators. This instrument is carefully placed in the esophagus to stretch it. ? Guided wire bougies. In this method, a flexible tube (endoscope) is used to insert a wire into the esophagus. The dilator is passed over this wire to enlarge the esophagus. Then the wire is removed. ? Balloon dilators. An endoscope with a small balloon at the end is passed down into the esophagus. Inflating the balloon gently stretches the esophagus and opens it up. What happens after the  procedure?  Your blood pressure, heart rate, breathing rate, and blood oxygen level will be monitored often until the medicines you were given have worn off.  Your throat may feel slightly sore and will probably still feel numb. This will improve slowly over time.  You will not be allowed to eat or drink until the throat numbness has resolved.  If this is a same-day procedure, you may be allowed to go home once you have been able to drink, urinate, and sit on the edge of the bed without nausea or dizziness.  If this is a same-day procedure, you should have a friend or family member with you for the next 24 hours after the procedure. This information is not intended to replace advice given to you by your health care provider. Make sure  you discuss any questions you have with your health care provider. Document Released: 01/10/2006 Document Revised: 04/26/2016 Document Reviewed: 03/31/2014 Elsevier Interactive Patient Education  2017 [employer] Anesthesia is a term that refers to techniques, procedures, and medicines that help a person stay safe and comfortable during a medical procedure. Monitored anesthesia care, or sedation, is one type of anesthesia. Your anesthesia specialist may recommend sedation if you will be having a procedure that does not require you to be unconscious, such as:  Cataract surgery.  A dental procedure.  A biopsy.  A colonoscopy.  During the procedure, you may receive a medicine to help you relax (sedative). There are three levels of sedation:  Mild sedation. At this level, you may feel awake and relaxed. You will be able to follow directions.  Moderate sedation. At this level, you will be sleepy. You may not remember the procedure.  Deep sedation. At this level, you will be asleep. You will not remember the procedure.  The more medicine you are given, the deeper your level of sedation will be. Depending on how you respond to  the procedure, the anesthesia specialist may change your level of sedation or the type of anesthesia to fit your needs. An anesthesia specialist will monitor you closely during the procedure. Let your health care provider know about:  Any allergies you have.  All medicines you are taking, including vitamins, herbs, eye drops, creams, and over-the-counter medicines.  Any use of steroids (by mouth or as a cream).  Any problems you or family members have had with sedatives and anesthetic medicines.  Any blood disorders you have.  Any surgeries you have had.  Any medical conditions you have, such as sleep apnea.  Whether you are pregnant or may be pregnant.  Any use of cigarettes, alcohol, or street drugs. What are the risks? Generally, this is a safe procedure. However, problems may occur, including:  Getting too much medicine (oversedation).  Nausea.  Allergic reaction to medicines.  Trouble breathing. If this happens, a breathing tube may be used to help with breathing. It will be removed when you are awake and breathing on your own.  Heart trouble.  Lung trouble.  Before the procedure Staying hydrated Follow instructions from your health care provider about hydration, which may include:  Up to 2 hours before the procedure - you may continue to drink clear liquids, such as water, clear fruit juice, black coffee, and plain tea.  Eating and drinking restrictions Follow instructions from your health care provider about eating and drinking, which may include:  8 hours before the procedure - stop eating heavy meals or foods such as meat, fried foods, or fatty foods.  6 hours before the procedure - stop eating light meals or foods, such as toast or cereal.  6 hours before the procedure - stop drinking milk or drinks that contain milk.  2 hours before the procedure - stop drinking clear liquids.  Medicines Ask your health care provider about:  Changing or stopping your  regular medicines. This is especially important if you are taking diabetes medicines or blood thinners.  Taking medicines such as aspirin and ibuprofen. These medicines can thin your blood. Do not take these medicines before your procedure if your health care provider instructs you not to.  Tests and exams  You will have a physical exam.  You may have blood tests done to show: ? How well your kidneys and liver are working. ? How well  your blood can clot.  General instructions  Plan to have someone take you home from the hospital or clinic.  If you will be going home right after the procedure, plan to have someone with you for 24 hours.  What happens during the procedure?  Your blood pressure, heart rate, breathing, level of pain and overall condition will be monitored.  An IV tube will be inserted into one of your veins.  Your anesthesia specialist will give you medicines as needed to keep you comfortable during the procedure. This may mean changing the level of sedation.  The procedure will be performed. After the procedure  Your blood pressure, heart rate, breathing rate, and blood oxygen level will be monitored until the medicines you were given have worn off.  Do not drive for 24 hours if you received a sedative.  You may: ? Feel sleepy, clumsy, or nauseous. ? Feel forgetful about what happened after the procedure. ? Have a sore throat if you had a breathing tube during the procedure. ? Vomit. This information is not intended to replace advice given to you by your health care provider. Make sure you discuss any questions you have with your health care provider. Document Released: 08/15/2005 Document Revised: 04/27/2016 Document Reviewed: 03/11/2016 Elsevier Interactive Patient Education  2018 [place], Care After These instructions provide you with information about caring for yourself after your procedure. Your health care provider may  also give you more specific instructions. Your treatment has been planned according to current medical practices, but problems sometimes occur. Call your health care provider if you have any problems or questions after your procedure. What can I expect after the procedure? After your procedure, it is common to:  Feel sleepy for several hours.  Feel clumsy and have poor balance for several hours.  Feel forgetful about what happened after the procedure.  Have poor judgment for several hours.  Feel nauseous or vomit.  Have a sore throat if you had a breathing tube during the procedure.  Follow these instructions at home: For at least 24 hours after the procedure:   Do not: ? Participate in activities in which you could fall or become injured. ? Drive. ? Use heavy machinery. ? Drink alcohol. ? Take sleeping pills or medicines that cause drowsiness. ? Make important decisions or sign legal documents. ? Take care of children on your own.  Rest. Eating and drinking  Follow the diet that is recommended by your health care provider.  If you vomit, drink water, juice, or soup when you can drink without vomiting.  Make sure you have little or no nausea before eating solid foods. General instructions  Have a responsible adult stay with you until you are awake and alert.  Take over-the-counter and prescription medicines only as told by your health care provider.  If you smoke, do not smoke without supervision.  Keep all follow-up visits as told by your health care provider. This is important. Contact a health care provider if:  You keep feeling nauseous or you keep vomiting.  You feel light-headed.  You develop a rash.  You have a fever. Get help right away if:  You have trouble breathing. This information is not intended to replace advice given to you by your health care provider. Make sure you discuss any questions you have with your health care provider. Document  Released: 03/11/2016 Document Revised: 07/11/2016 Document Reviewed: 03/11/2016 Elsevier Interactive Patient Education  [employer].

## 2017-05-13 ENCOUNTER — Encounter (HOSPITAL_COMMUNITY): Payer: Self-pay

## 2017-05-13 ENCOUNTER — Encounter (HOSPITAL_COMMUNITY)
Admission: RE | Admit: 2017-05-13 | Discharge: 2017-05-13 | Disposition: A | Discharge status: Home / Self Care | Payer: Medicaid Other | Source: Ambulatory Visit | Attending: Internal Medicine | Admitting: Internal Medicine

## 2017-05-13 ENCOUNTER — Other Ambulatory Visit: Payer: Self-pay

## 2017-05-13 DIAGNOSIS — R1310 Dysphagia, unspecified: Secondary | ICD-10-CM

## 2017-05-13 DIAGNOSIS — Z01812 Encounter for preprocedural laboratory examination: Secondary | ICD-10-CM | POA: Insufficient documentation

## 2017-05-13 DIAGNOSIS — K219 Gastro-esophageal reflux disease without esophagitis: Secondary | ICD-10-CM

## 2017-05-13 DIAGNOSIS — Z01810 Encounter for preprocedural cardiovascular examination: Secondary | ICD-10-CM | POA: Insufficient documentation

## 2017-05-13 LAB — BASIC METABOLIC PANEL
ANION GAP: 10 (ref 5–15)
BUN: 11 mg/dL (ref 6–20)
CALCIUM: 8.9 mg/dL (ref 8.9–10.3)
CO2: 23 mmol/L (ref 22–32)
Chloride: 107 mmol/L (ref 101–111)
Creatinine, Ser: 0.81 mg/dL (ref 0.61–1.24)
GFR calc Af Amer: >60 mL/min (ref >60)
Glucose, Bld: 100 mg/dL — ABNORMAL HIGH (ref 65–99)
POTASSIUM: 3.9 mmol/L (ref 3.5–5.1)
SODIUM: 140 mmol/L (ref 135–145)

## 2017-05-13 LAB — CBC WITH DIFFERENTIAL/PLATELET
BASOS ABS: 0 10*3/uL (ref 0.0–0.1)
Basophils Relative: 1 %
EOS ABS: 0.2 10*3/uL (ref 0.0–0.7)
EOS PCT: 2 %
HCT: 44.2 % (ref 39.0–52.0)
Hemoglobin: 14.5 g/dL (ref 13.0–17.0)
Lymphocytes Relative: 38 %
Lymphs Abs: 2.9 10*3/uL (ref 0.7–4.0)
MCH: 30.1 pg (ref 26.0–34.0)
MCHC: 32.8 g/dL (ref 30.0–36.0)
MCV: 91.9 fL (ref 78.0–100.0)
MONO ABS: 0.6 10*3/uL (ref 0.1–1.0)
Monocytes Relative: 7 %
Neutro Abs: 4 10*3/uL (ref 1.7–7.7)
Neutrophils Relative %: 52 %
PLATELETS: 354 10*3/uL (ref 150–400)
RBC: 4.81 MIL/uL (ref 4.22–5.81)
RDW: 14.7 % (ref 11.5–15.5)
WBC: 7.7 10*3/uL (ref 4.0–10.5)

## 2017-05-13 LAB — SURGICAL PCR SCREEN
MRSA, PCR: POSITIVE — AB
STAPHYLOCOCCUS AUREUS: POSITIVE — AB

## 2017-05-13 MED ORDER — LIDOCAINE VISCOUS 2 % MT SOLN
OROMUCOSAL | Status: AC
  Filled 2017-05-13: qty 15

## 2017-05-13 MED ORDER — MIDAZOLAM HCL 2 MG/2ML IJ SOLN
INTRAMUSCULAR | Status: AC
  Filled 2017-05-13: qty 2

## 2017-05-13 MED ORDER — FENTANYL CITRATE (PF) 100 MCG/2ML IJ SOLN
INTRAMUSCULAR | Status: AC
  Filled 2017-05-13: qty 2

## 2017-05-14 MED ORDER — MUPIROCIN 2 % EX OINT
TOPICAL_OINTMENT | CUTANEOUS | Status: AC
  Filled 2017-05-14: qty 22

## 2017-05-16 ENCOUNTER — Ambulatory Visit (HOSPITAL_COMMUNITY): Payer: Medicaid Other | Admitting: Anesthesiology

## 2017-05-16 ENCOUNTER — Ambulatory Visit (HOSPITAL_COMMUNITY)
Admission: RE | Admit: 2017-05-16 | Discharge: 2017-05-16 | Disposition: A | Discharge status: Home / Self Care | Payer: Medicaid Other | Source: Ambulatory Visit | Attending: Internal Medicine | Admitting: Internal Medicine

## 2017-05-16 ENCOUNTER — Encounter (HOSPITAL_COMMUNITY): Payer: Self-pay

## 2017-05-16 ENCOUNTER — Encounter (HOSPITAL_COMMUNITY)
Admission: RE | Disposition: A | Discharge status: Home / Self Care | Payer: Self-pay | Source: Ambulatory Visit | Attending: Internal Medicine

## 2017-05-16 DIAGNOSIS — R1310 Dysphagia, unspecified: Secondary | ICD-10-CM | POA: Insufficient documentation

## 2017-05-16 DIAGNOSIS — R1314 Dysphagia, pharyngoesophageal phase: Secondary | ICD-10-CM | POA: Diagnosis not present

## 2017-05-16 DIAGNOSIS — K449 Diaphragmatic hernia without obstruction or gangrene: Secondary | ICD-10-CM

## 2017-05-16 DIAGNOSIS — K317 Polyp of stomach and duodenum: Secondary | ICD-10-CM | POA: Diagnosis not present

## 2017-05-16 DIAGNOSIS — E785 Hyperlipidemia, unspecified: Secondary | ICD-10-CM | POA: Diagnosis not present

## 2017-05-16 DIAGNOSIS — F17210 Nicotine dependence, cigarettes, uncomplicated: Secondary | ICD-10-CM | POA: Diagnosis not present

## 2017-05-16 DIAGNOSIS — I10 Essential (primary) hypertension: Secondary | ICD-10-CM | POA: Insufficient documentation

## 2017-05-16 DIAGNOSIS — K228 Other specified diseases of esophagus: Secondary | ICD-10-CM | POA: Diagnosis not present

## 2017-05-16 DIAGNOSIS — K222 Esophageal obstruction: Secondary | ICD-10-CM | POA: Diagnosis not present

## 2017-05-16 DIAGNOSIS — K219 Gastro-esophageal reflux disease without esophagitis: Secondary | ICD-10-CM | POA: Insufficient documentation

## 2017-05-16 DIAGNOSIS — K209 Esophagitis, unspecified: Secondary | ICD-10-CM | POA: Diagnosis not present

## 2017-05-16 DIAGNOSIS — K3189 Other diseases of stomach and duodenum: Secondary | ICD-10-CM | POA: Insufficient documentation

## 2017-05-16 HISTORY — PX: ESOPHAGEAL DILATION: SHX303

## 2017-05-16 HISTORY — PX: ESOPHAGOGASTRODUODENOSCOPY (EGD) WITH PROPOFOL: SHX5813

## 2017-05-16 SURGERY — ESOPHAGOGASTRODUODENOSCOPY (EGD) WITH PROPOFOL
Anesthesia: Monitor Anesthesia Care

## 2017-05-16 MED ORDER — PROPOFOL 10 MG/ML IV BOLUS
INTRAVENOUS | Status: AC
  Filled 2017-05-16: qty 20

## 2017-05-16 MED ORDER — PANTOPRAZOLE SODIUM 40 MG PO TBEC: 40.0000 mg | DELAYED_RELEASE_TABLET | Freq: Every day | ORAL | 5 refills | Status: DC

## 2017-05-16 MED ORDER — CHLORHEXIDINE GLUCONATE CLOTH 2 % EX PADS: 6.0000 | MEDICATED_PAD | Freq: Once | CUTANEOUS | Status: DC

## 2017-05-16 MED ORDER — FENTANYL CITRATE (PF) 100 MCG/2ML IJ SOLN
25.0000 ug | Freq: Once | INTRAMUSCULAR | Status: AC
  Administered 2017-05-16: 25 ug via INTRAVENOUS

## 2017-05-16 MED ORDER — LIDOCAINE VISCOUS 2 % MT SOLN
OROMUCOSAL | Status: AC
  Filled 2017-05-16: qty 15

## 2017-05-16 MED ORDER — MIDAZOLAM HCL 2 MG/2ML IJ SOLN
1.0000 mg | INTRAMUSCULAR | Status: AC
  Administered 2017-05-16: 2 mg via INTRAVENOUS
  Filled 2017-05-16: qty 2

## 2017-05-16 MED ORDER — PROPOFOL 500 MG/50ML IV EMUL
INTRAVENOUS | Status: DC | PRN
  Administered 2017-05-16: 125 ug/kg/min via INTRAVENOUS

## 2017-05-16 MED ORDER — LACTATED RINGERS IV SOLN
INTRAVENOUS | Status: DC
  Administered 2017-05-16: 07:00:00 via INTRAVENOUS

## 2017-05-16 MED ORDER — FENTANYL CITRATE (PF) 100 MCG/2ML IJ SOLN
INTRAMUSCULAR | Status: AC
  Filled 2017-05-16: qty 2

## 2017-05-16 MED ORDER — GLYCOPYRROLATE 0.2 MG/ML IJ SOLN: 0.2000 mg | Freq: Once | INTRAMUSCULAR | Status: DC | PRN

## 2017-05-16 MED ORDER — PROPOFOL 10 MG/ML IV BOLUS
INTRAVENOUS | Status: DC | PRN
  Administered 2017-05-16: 20 mg via INTRAVENOUS
  Administered 2017-05-16: 10 mg via INTRAVENOUS
  Administered 2017-05-16: 20 mg via INTRAVENOUS

## 2017-05-16 MED ORDER — ONDANSETRON HCL 4 MG/2ML IJ SOLN
INTRAMUSCULAR | Status: AC
  Filled 2017-05-16: qty 2

## 2017-05-16 MED ORDER — LIDOCAINE VISCOUS 2 % MT SOLN
5.0000 mL | Freq: Once | OROMUCOSAL | Status: AC
  Administered 2017-05-16: 5 mL via OROMUCOSAL

## 2017-05-16 MED ORDER — ONDANSETRON HCL 4 MG/2ML IJ SOLN
4.0000 mg | Freq: Once | INTRAMUSCULAR | Status: AC
  Administered 2017-05-16: 4 mg via INTRAVENOUS

## 2017-05-16 NOTE — H&P (Signed)
Xavier Livingston is an 61 y.o. male.   Chief Complaint: Patient here for EGD and EGD. HPI: Patient is 61 year old medical male who presents with several month history of dysphagia to solid and liquids. He was evaluated 2 years ago of EGD pH and esophageal manometry. Esophageal manometry was normal. He did respond to esophageal dilation with response was short-lived. He underwent a barium esophagogram in October 2017 which suggests a distal esophageal stricture. EGD in December 2017 revealed irregular GE junction. 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 It is superficial dilation was not performed because of noncooperation. He remains with dysphagia to solids and liquids. Now he points mid sternum area site of bolus obstruction.[name] He states he has lost over 20 pounds in the last 6 weeks.  Past Medical History:  Diagnosis Date  . Arthritis   . Dysrhythmia    irregular heartbeat in the 80's  . Enlarged prostate   . GERD (gastroesophageal reflux disease)   . Headache   . Hyperlipemia   . Hypertension   . Nocturia more than twice per night   . Smoker within last 12 months     Past Surgical History:  Procedure Laterality Date  . ANTERIOR CERVICAL DECOMPRESSION/DISCECTOMY FUSION 4 LEVELS N/A 04/11/2015   Procedure: Anterior Cervical Discectomy/ Decompression Fusion Cervical three-four, Cervical four-five, Cervical five-six, Cervical six- seven;  Surgeon: Kary Kos, MD;  Location: [facility];  Service: Neurosurgery;  Laterality: N/A;  . COLONOSCOPY N/A 08/18/2014   Procedure: COLONOSCOPY;  Surgeon: Rogene Houston, MD;  Location: [facility];  Service: Endoscopy;  Laterality: N/A;  1030-moved to 1200 Ann to notify pt  . ESOPHAGEAL DILATION N/A 11/29/2016   Procedure: ESOPHAGEAL DILATION;  Surgeon: Rogene Houston, MD;  Location: [facility];  Service: Endoscopy;  Laterality: N/A;  . ESOPHAGOGASTRODUODENOSCOPY N/A 08/18/2014   Procedure: ESOPHAGOGASTRODUODENOSCOPY (EGD);  Surgeon: Rogene Houston, MD;  Location: [facility];  Service: Endoscopy;  Laterality: N/A;  . ESOPHAGOGASTRODUODENOSCOPY N/A 11/29/2016   Procedure: ESOPHAGOGASTRODUODENOSCOPY (EGD);  Surgeon: Rogene Houston, MD;  Location: [facility];  Service: Endoscopy;  Laterality: N/A;  12:00  . excision of mass of back    . MALONEY DILATION  08/18/2014   Procedure: MALONEY DILATION;  Surgeon: Rogene Houston, MD;  Location: [facility];  Service: Endoscopy;;  . MASS EXCISION N/A 10/21/2013   Procedure: EXCISION MASSES X 2 ON BACK;  Surgeon: Scherry Ran, MD;  Location: [facility];  Service: General;  Laterality: N/A;  . NASAL SEPTUM SURGERY      History reviewed. No pertinent family history. Social History:  reports that he has been smoking Cigarettes and Cigars.  He has a 10.00 pack-year smoking history. He has never used smokeless tobacco. He reports that he drinks about 12.0 oz of alcohol per week . He reports that he uses drugs.  Allergies: No Known Allergies  Medications Prior to Admission  Medication Sig Dispense Refill  . amLODipine (NORVASC) 5 MG tablet Take 5 mg by mouth daily.     . Oxycodone HCl 10 MG TABS Take 10 mg by mouth 2 (two) times daily as needed (pain).    . pravastatin (PRAVACHOL) 40 MG tablet Take 40 mg by mouth daily.      No results found for this or any previous visit (from the past 48 hour(s)). No results found.  ROS  Blood pressure 106/72, pulse 77, temperature 97.7 F (36.5 C), temperature source Oral, resp. rate 18, height 5'  11" (1.803 m), weight 164 lb (74.4 kg), SpO2 100 %. Physical Exam  Constitutional: He appears well-developed and well-nourished.  HENT:  Mouth/Throat: Oropharynx is clear and moist.  He has upper and lower dentures.  Eyes: Conjunctivae are normal. No scleral icterus.  Neck: No thyromegaly present.  Cardiovascular: Normal rate, regular rhythm and normal heart sounds.   No murmur heard. Respiratory: Effort normal and breath sounds normal.  GI:  Soft. He exhibits no distension and no mass. There is no tenderness.  Musculoskeletal: He exhibits no edema.  Lymphadenopathy:    He has no cervical adenopathy.  Neurological: He is alert.  Skin: Skin is warm and dry.     Assessment/Plan Esophageal dysphagia. EGD with ED under monitored anesthesia care.  Hildred Laser, MD 05/16/2017, 8:38 AM

## 2017-05-16 NOTE — Anesthesia Preprocedure Evaluation (Signed)
Anesthesia Evaluation  Patient identified by MRN, date of birth, ID band Patient awake    Reviewed: Allergy & Precautions, H&P , NPO status , Patient's Chart, lab work & pertinent test results  Airway Mallampati: II  TM Distance: >3 FB Neck ROM: Limited   Comment: Limited neck ROM due to pain. Dental  (+) Edentulous Upper, Edentulous Lower   Pulmonary Current Smoker,    breath sounds clear to auscultation       Cardiovascular hypertension, Pt. on medications + dysrhythmias  Rhythm:Regular Rate:Normal     Neuro/Psych  Headaches,    GI/Hepatic negative GI ROS, GERD  ,(+)     substance abuse (12oz / week)  alcohol use,   Endo/Other    Renal/GU      Musculoskeletal   Abdominal   Peds  Hematology   Anesthesia Other Findings   Reproductive/Obstetrics                             Anesthesia Physical Anesthesia Plan  ASA: II  Anesthesia Plan: MAC   Post-op Pain Management:    Induction: Intravenous  PONV Risk Score and Plan:   Airway Management Planned: Simple Face Mask  Additional Equipment:   Intra-op Plan:   Post-operative Plan:   Informed Consent: I have reviewed the patients History and Physical, chart, labs and discussed the procedure including the risks, benefits and alternatives for the proposed anesthesia with the patient or authorized representative who has indicated his/her understanding and acceptance.     Plan Discussed with:   Anesthesia Plan Comments: (Check neck position awake.)        Anesthesia Quick Evaluation

## 2017-05-16 NOTE — Op Note (Signed)
[facility] Patient Name: Xavier Livingston Procedure Date: 05/16/2017 7:58 AM MRN: [id number] Date of Birth: [birth year] Attending MD: Hildred Laser , MD CSN: [id number] Age: 61 Admit Type: Outpatient Procedure:                Upper GI endoscopy Indications:              Esophageal dysphagia Providers:                Hildred Laser, MD, Lurline Del, RN, Rosina Lowenstein, RN Referring MD:             Ralene Bathe. Dondiego, MD Medicines:                Lidocaine spray, Propofol per Anesthesia Complications:            No immediate complications. Estimated Blood Loss:     Estimated blood loss: none. Procedure:                Pre-Anesthesia Assessment:                           - Prior to the procedure, a History and Physical                            was performed, and patient medications and                            allergies were reviewed. The patient's tolerance of                            previous anesthesia was also reviewed. The risks                            and benefits of the procedure and the sedation                            options and risks were discussed with the patient.                            All questions were answered, and informed consent                            was obtained. Prior Anticoagulants: The patient has                            taken no previous anticoagulant or antiplatelet                            agents. ASA Grade Assessment: II - A patient with                            mild systemic disease. After reviewing the risks                            and benefits, the patient was deemed in  satisfactory condition to undergo the procedure.                           After obtaining informed consent, the endoscope was                            passed under direct vision. Throughout the                            procedure, the patient's blood pressure, pulse, and                            oxygen saturations were monitored  continuously. The                            [id number] ([id number]) scope was introduced through the                            mouth, and advanced to the second part of duodenum.                            The upper GI endoscopy was accomplished without                            difficulty. The patient tolerated the procedure                            well. Scope In: 9:00:51 AM Scope Out: 9:15:42 AM Total Procedure Duration: 0 hours 14 minutes 51 seconds  Findings:      The examined esophagus was normal.      The Z-line was irregular and was found 38 cm from the incisors.      A 2 cm hiatal hernia was present.      No endoscopic abnormality was evident in the esophagus to explain the       patient's complaint of dysphagia. It was decided, however, to proceed       with dilation of the entire esophagus. The scope was withdrawn. Dilation       was performed with a Maloney dilator with no resistance at 41 Fr, 56 Fr       and 58 Fr. The dilation site was examined following endoscope       reinsertion and showed no change and no bleeding, mucosal tear or       perforation.      Patchy mildly erythematous mucosa without bleeding was found in the       prepyloric region of the stomach.      The exam of the stomach was otherwise normal.      The duodenal bulb and second portion of the duodenum were normal. Impression:               - Normal esophagus.                           - Z-line irregular, 38 cm from the incisors.                           -  2 cm hiatal hernia.                           - No endoscopic esophageal abnormality to explain                            patient's dysphagia. Esophagus dilated. Dilated.                           - Erythematous mucosa in the prepyloric region of                            the stomach.                           - Normal duodenal bulb and second portion of the                            duodenum.                           - No specimens  collected. Moderate Sedation:      Per Anesthesia Care Recommendation:           - Patient has a contact number available for                            emergencies. The signs and symptoms of potential                            delayed complications were discussed with the                            patient. Return to normal activities tomorrow.                            Written discharge instructions were provided to the                            patient.                           - Resume previous diet today.                           - Continue present medications.                           - Resume pantoprazole at 40 mg by mouth every                            morning.                           - Telephone GI clinic in 1 week. Procedure Code(s):        --- Professional ---  86754, Esophagogastroduodenoscopy, flexible,                            transoral; diagnostic, including collection of                            specimen(s) by brushing or washing, when performed                            (separate procedure)                           43450, Dilation of esophagus, by unguided sound or                            bougie, single or multiple passes Diagnosis Code(s):        --- Professional ---                           K22.8, Other specified diseases of esophagus                           K44.9, Diaphragmatic hernia without obstruction or                            gangrene                           K31.89, Other diseases of stomach and duodenum                           R13.14, Dysphagia, pharyngoesophageal phase CPT copyright 2016 American Medical Association. All rights reserved. The codes documented in this report are preliminary and upon coder review may  be revised to meet current compliance requirements. Hildred Laser, MD Hildred Laser, MD 05/16/2017 9:25:53 AM This report has been signed electronically. Number of Addenda: 0

## 2017-05-16 NOTE — Transfer of Care (Signed)
Immediate Anesthesia Transfer of Care Note  Patient: Xavier Livingston  Procedure(s) Performed: Procedure(s) with comments: ESOPHAGOGASTRODUODENOSCOPY (EGD) WITH PROPOFOL (N/A) - 8:30 ESOPHAGEAL DILATION (N/A)  Patient Location: PACU  Anesthesia Type:MAC  Level of Consciousness: awake and alert   Airway & Oxygen Therapy: Patient Spontanous Breathing and Patient connected to nasal cannula oxygen  Post-op Assessment: Report given to RN  Post vital signs: Reviewed and stable  Last Vitals:  Vitals:   05/16/17 0715 05/16/17 0730  BP: 111/74 106/72  Pulse:    Resp: 15 18  Temp:      Last Pain:  Vitals:   05/16/17 0702  TempSrc: Oral  PainSc: 7          Complications: No apparent anesthesia complications

## 2017-05-16 NOTE — Discharge Instructions (Signed)
Go back on pantoprazole at a dose of 40 mg by mouth 30 minutes before breakfast every day. Resume medications as before. No driving for 24 hours. Please call office with progress report next week. Gastrointestinal Endoscopy, Care After Refer to this sheet in the next few weeks. These instructions provide you with information about caring for yourself after your procedure. Your health care provider may also give you more specific instructions. Your treatment has been planned according to current medical practices, but problems sometimes occur. Call your health care provider if you have any problems or questions after your procedure. What can I expect after the procedure? After your procedure, it is common to feel:  Bloated.  Soreness in your throat.  Sleepy.  Follow these instructions at home:  Do not drive for 24 hours if you received a if you received a medicine to help you relax (sedative).  Avoid drinking warm beverages and alcohol for the first 24 hours after the procedure.  Take over-the-counter and prescription medicines only as told by your health care provider.  Drink enough fluids to keep your urine clear or pale yellow.  If you feel bloated, try going for a walk. Walking may help the feeling go away.  If your throat is sore, try gargling with salt water. Get help right away if:  You have severe nausea or vomiting.  You have severe abdominal pain, abdominal cramps that last longer than 6 hours, or abdominal swelling.  You have severe shoulder or back pain.  You have trouble swallowing.  You have shortness of breath, your breathing is shallow, or you breathing is faster than normal.  You have a fever.  Your heart is beating very fast.  You vomit blood or material that looks like coffee grounds.  You have bloody, black, or tarry stools. This information is not intended to replace advice given to you by your health care provider. Make sure you discuss any  questions you have with your health care provider. Document Released: 07/03/2004 Document Revised: 09/26/2016 Document Reviewed: 09/11/2015 Elsevier Interactive Patient Education  2018 [employer].  Esophageal Dilatation Esophageal dilatation is a procedure to open a blocked or narrowed part of the esophagus. The esophagus is the long tube in your throat that carries food and liquid from your mouth to your stomach. The procedure is also called esophageal dilation. You may need this procedure if you have a buildup of scar tissue in your esophagus that makes it difficult, painful, or even impossible to swallow. This can be caused by gastroesophageal reflux disease (GERD). In rare cases, people need this procedure because they have cancer of the esophagus or a problem with the way food moves through the esophagus. Sometimes you may need to have another dilatation to enlarge the opening of the esophagus gradually. Tell a health care provider about:  Any allergies you have.  All medicines you are taking, including vitamins, herbs, eye drops, creams, and over-the-counter medicines.  Any problems you or family members have had with anesthetic medicines.  Any blood disorders you have.  Any surgeries you have had.  Any medical conditions you have.  Any antibiotic medicines you are required to take before dental procedures. What are the risks? Generally, this is a safe procedure. However, problems can occur and include:  Bleeding from a tear in the lining of the esophagus.  A hole (perforation) in the esophagus.  What happens before the procedure?  Do not eat or drink anything after midnight on the night before  the procedure or as directed by your health care provider.  Ask your health care provider about changing or stopping your regular medicines. This is especially important if you are taking diabetes medicines or blood thinners.  Plan to have someone take you home after the  procedure. What happens during the procedure?  You will be given a medicine that makes you relaxed and sleepy (sedative).  A medicine may be sprayed or gargled to numb the back of the throat.  Your health care provider can use various instruments to do an esophageal dilatation. During the procedure, the instrument used will be placed in your mouth and passed down into your esophagus. Options include: ? Simple dilators. This instrument is carefully placed in the esophagus to stretch it. ? Guided wire bougies. In this method, a flexible tube (endoscope) is used to insert a wire into the esophagus. The dilator is passed over this wire to enlarge the esophagus. Then the wire is removed. ? Balloon dilators. An endoscope with a small balloon at the end is passed down into the esophagus. Inflating the balloon gently stretches the esophagus and opens it up. What happens after the procedure?  Your blood pressure, heart rate, breathing rate, and blood oxygen level will be monitored often until the medicines you were given have worn off.  Your throat may feel slightly sore and will probably still feel numb. This will improve slowly over time.  You will not be allowed to eat or drink until the throat numbness has resolved.  If this is a same-day procedure, you may be allowed to go home once you have been able to drink, urinate, and sit on the edge of the bed without nausea or dizziness.  If this is a same-day procedure, you should have a friend or family member with you for the next 24 hours after the procedure. This information is not intended to replace advice given to you by your health care provider. Make sure you discuss any questions you have with your health care provider. Document Released: 01/10/2006 Document Revised: 04/26/2016 Document Reviewed: 03/31/2014 Elsevier Interactive Patient Education  2017 [employer].

## 2017-05-21 ENCOUNTER — Encounter (HOSPITAL_COMMUNITY): Payer: Self-pay | Admitting: Internal Medicine

## 2017-05-21 NOTE — Anesthesia Postprocedure Evaluation (Signed)
Anesthesia Post Note  Patient: Xavier Livingston  Procedure(s) Performed: Procedure(s) (LRB): ESOPHAGOGASTRODUODENOSCOPY (EGD) WITH PROPOFOL (N/A) ESOPHAGEAL DILATION (N/A)  Patient location during evaluation: PACU Anesthesia Type: MAC Level of consciousness: awake and alert and oriented Pain management: pain level controlled Vital Signs Assessment: post-procedure vital signs reviewed and stable Respiratory status: spontaneous breathing Cardiovascular status: stable Postop Assessment: no signs of nausea or vomiting Anesthetic complications: no Comments: Late entry, patient seen in PACU prior to discharge.     Last Vitals:  Vitals:   05/16/17 0930 05/16/17 0950  BP: 102/66 116/78  Pulse: 64 72  Resp: 16 16  Temp:  36.8 C    Last Pain:  Vitals:   05/16/17 0950  TempSrc: Oral  PainSc: 3                  Shavon Ashmore

## 2017-11-06 ENCOUNTER — Other Ambulatory Visit (HOSPITAL_COMMUNITY): Payer: Self-pay | Admitting: Student

## 2017-11-06 DIAGNOSIS — M5412 Radiculopathy, cervical region: Secondary | ICD-10-CM

## 2017-11-11 ENCOUNTER — Ambulatory Visit (HOSPITAL_COMMUNITY)
Admission: RE | Admit: 2017-11-11 | Discharge: 2017-11-11 | Disposition: A | Discharge status: Home / Self Care | Payer: Medicaid Other | Source: Ambulatory Visit | Attending: Student | Admitting: Student

## 2017-11-11 DIAGNOSIS — M2578 Osteophyte, vertebrae: Secondary | ICD-10-CM | POA: Insufficient documentation

## 2017-11-11 DIAGNOSIS — M5412 Radiculopathy, cervical region: Secondary | ICD-10-CM

## 2017-11-11 DIAGNOSIS — M4802 Spinal stenosis, cervical region: Secondary | ICD-10-CM | POA: Diagnosis not present

## 2017-11-11 LAB — POCT I-STAT CREATININE: CREATININE: 0.9 mg/dL (ref 0.61–1.24)

## 2017-11-11 MED ORDER — GADOBENATE DIMEGLUMINE 529 MG/ML IV SOLN
15.0000 mL | Freq: Once | INTRAVENOUS | Status: AC | PRN
  Administered 2017-11-11: 15 mL via INTRAVENOUS

## 2017-12-02 ENCOUNTER — Other Ambulatory Visit: Payer: Self-pay | Admitting: Student

## 2017-12-02 DIAGNOSIS — M542 Cervicalgia: Secondary | ICD-10-CM

## 2017-12-03 HISTORY — PX: NECK SURGERY: SHX720

## 2017-12-10 ENCOUNTER — Other Ambulatory Visit: Payer: Medicaid Other

## 2017-12-19 ENCOUNTER — Ambulatory Visit
Admission: RE | Admit: 2017-12-19 | Discharge: 2017-12-19 | Disposition: A | Discharge status: Home / Self Care | Payer: Medicaid Other | Source: Ambulatory Visit | Attending: Student | Admitting: Student

## 2017-12-19 DIAGNOSIS — M542 Cervicalgia: Secondary | ICD-10-CM

## 2017-12-26 ENCOUNTER — Other Ambulatory Visit: Payer: Self-pay | Admitting: Neurosurgery

## 2018-01-17 NOTE — Pre-Procedure Instructions (Signed)
Xavier Livingston  01/17/2018      [facility] - [place], [address] [place] [address] Phone: [phone] Fax: [phone]    Your procedure is scheduled on Mon. Feb. 25  Report to [facility] Admitting at 5:30 A.M.  Call this number if you have problems the morning of surgery:  [phone]   Remember:  Do not eat food or drink liquids after midnight Sun. Feb. 24   Take these medicines the morning of surgery with A SIP OF WATER :amlodipine (norvasc),oxycodone if needed, pantoprazole (protonix)               7 days prior to surgery STOP taking any Aspirin(unless otherwise instructed by your surgeon), Aleve, Naproxen, Ibuprofen, Motrin, Advil, Goody's, BC's, all herbal medications, fish oil, and all vitamins   Do not wear jewelry.  Do not wear lotions, powders, or perfumes, or deodorant.  Do not shave 48 hours prior to surgery.  Men may shave face and neck.  Do not bring valuables to the hospital.  [facility] is not responsible for any belongings or valuables.  Contacts, dentures or bridgework may not be worn into surgery.  Leave your suitcase in the car.  After surgery it may be brought to your room.  For patients admitted to the hospital, discharge time will be determined by your treatment team.  Patients discharged the day of surgery will not be allowed to drive home.    Special instructions:  [place]- Preparing For Surgery  Before surgery, you can play an important role. Because skin is not sterile, your skin needs to be as free of germs as possible. You can reduce the number of germs on your skin by washing with CHG (chlorahexidine gluconate) Soap before surgery.  CHG is an antiseptic cleaner which kills germs and bonds with the skin to continue killing germs even after washing.  Please do not use if you have an allergy to CHG or antibacterial soaps. If your skin becomes reddened/irritated stop using the CHG.  Do  not shave (including legs and underarms) for at least 48 hours prior to first CHG shower. It is OK to shave your face.  Please follow these instructions carefully.   1. Shower the NIGHT BEFORE SURGERY and the MORNING OF SURGERY with CHG.   2. If you chose to wash your hair, wash your hair first as usual with your normal shampoo.  3. After you shampoo, rinse your hair and body thoroughly to remove the shampoo.  4. Use CHG as you would any other liquid soap. You can apply CHG directly to the skin and wash gently with a scrungie or a clean washcloth.   5. Apply the CHG Soap to your body ONLY FROM THE NECK DOWN.  Do not use on open wounds or open sores. Avoid contact with your eyes, ears, mouth and genitals (private parts). Wash Face and genitals (private parts)  with your normal soap.  6. Wash thoroughly, paying special attention to the area where your surgery will be performed.  7. Thoroughly rinse your body with warm water from the neck down.  8. DO NOT shower/wash with your normal soap after using and rinsing off the CHG Soap.  9. Pat yourself dry with a CLEAN TOWEL.  10. Wear CLEAN PAJAMAS to bed the night before surgery, wear comfortable clothes the morning of surgery  11. Place CLEAN SHEETS on your bed the night of your  first shower and DO NOT SLEEP WITH PETS.    Day of Surgery: Do not apply any deodorants/lotions. Please wear clean clothes to the hospital/surgery center.      Please read over the following fact sheets that you were given. Coughing and Deep Breathing, MRSA Information and Surgical Site Infection Prevention

## 2018-01-20 ENCOUNTER — Encounter (HOSPITAL_COMMUNITY): Payer: Self-pay

## 2018-01-20 ENCOUNTER — Other Ambulatory Visit: Payer: Self-pay

## 2018-01-20 ENCOUNTER — Encounter (HOSPITAL_COMMUNITY)
Admission: RE | Admit: 2018-01-20 | Discharge: 2018-01-20 | Disposition: A | Discharge status: Home / Self Care | Payer: Medicaid Other | Source: Ambulatory Visit | Attending: Neurosurgery | Admitting: Neurosurgery

## 2018-01-20 DIAGNOSIS — E785 Hyperlipidemia, unspecified: Secondary | ICD-10-CM | POA: Diagnosis not present

## 2018-01-20 DIAGNOSIS — Z01818 Encounter for other preprocedural examination: Secondary | ICD-10-CM | POA: Diagnosis not present

## 2018-01-20 DIAGNOSIS — N400 Enlarged prostate without lower urinary tract symptoms: Secondary | ICD-10-CM | POA: Insufficient documentation

## 2018-01-20 DIAGNOSIS — M1990 Unspecified osteoarthritis, unspecified site: Secondary | ICD-10-CM | POA: Diagnosis not present

## 2018-01-20 DIAGNOSIS — Z981 Arthrodesis status: Secondary | ICD-10-CM | POA: Insufficient documentation

## 2018-01-20 DIAGNOSIS — I10 Essential (primary) hypertension: Secondary | ICD-10-CM | POA: Diagnosis not present

## 2018-01-20 DIAGNOSIS — Z0183 Encounter for blood typing: Secondary | ICD-10-CM | POA: Diagnosis not present

## 2018-01-20 DIAGNOSIS — K219 Gastro-esophageal reflux disease without esophagitis: Secondary | ICD-10-CM | POA: Diagnosis not present

## 2018-01-20 DIAGNOSIS — Z01812 Encounter for preprocedural laboratory examination: Secondary | ICD-10-CM | POA: Insufficient documentation

## 2018-01-20 DIAGNOSIS — Z79899 Other long term (current) drug therapy: Secondary | ICD-10-CM | POA: Insufficient documentation

## 2018-01-20 HISTORY — DX: Dyspnea, unspecified: R06.00

## 2018-01-20 HISTORY — DX: Dysphagia, unspecified: R13.10

## 2018-01-20 HISTORY — DX: Unspecified convulsions: R56.9

## 2018-01-20 LAB — COMPREHENSIVE METABOLIC PANEL
ALBUMIN: 4.1 g/dL (ref 3.5–5.0)
ALK PHOS: 85 U/L (ref 38–126)
ALT: 14 U/L — ABNORMAL LOW (ref 17–63)
AST: 22 U/L (ref 15–41)
Anion gap: 10 (ref 5–15)
BILIRUBIN TOTAL: 0.8 mg/dL (ref 0.3–1.2)
BUN: 7 mg/dL (ref 6–20)
CALCIUM: 9.2 mg/dL (ref 8.9–10.3)
CO2: 21 mmol/L — ABNORMAL LOW (ref 22–32)
Chloride: 107 mmol/L (ref 101–111)
Creatinine, Ser: 0.89 mg/dL (ref 0.61–1.24)
GFR calc Af Amer: >60 mL/min (ref >60)
GFR calc non Af Amer: >60 mL/min (ref >60)
GLUCOSE: 94 mg/dL (ref 65–99)
Potassium: 4.1 mmol/L (ref 3.5–5.1)
Sodium: 138 mmol/L (ref 135–145)
TOTAL PROTEIN: 7 g/dL (ref 6.5–8.1)

## 2018-01-20 LAB — TYPE AND SCREEN
ABO/RH(D): O POS
ANTIBODY SCREEN: NEGATIVE

## 2018-01-20 LAB — CBC
HCT: 45.7 % (ref 39.0–52.0)
Hemoglobin: 15.4 g/dL (ref 13.0–17.0)
MCH: 30.3 pg (ref 26.0–34.0)
MCHC: 33.7 g/dL (ref 30.0–36.0)
MCV: 89.8 fL (ref 78.0–100.0)
PLATELETS: 311 10*3/uL (ref 150–400)
RBC: 5.09 MIL/uL (ref 4.22–5.81)
RDW: 14.9 % (ref 11.5–15.5)
WBC: 6 10*3/uL (ref 4.0–10.5)

## 2018-01-20 LAB — SURGICAL PCR SCREEN
MRSA, PCR: NEGATIVE
Staphylococcus aureus: NEGATIVE

## 2018-01-20 LAB — ABO/RH: ABO/RH(D): O POS

## 2018-01-20 NOTE — Progress Notes (Signed)
PCP is Dr. Lucia Gaskins States he saw a cardiologist many years ago  Maybe 20 years or more? Denies ever having a card cath or echo. States he may have had a stress test, but if he did it was over 20 years ago.  Denies chest pain, but reports swallowing problems, with food getting stuck. He has been to multi Drs about this, and many esophageal dilations done, but continues to have problems with this. Denies cough or fever.

## 2018-01-20 NOTE — Progress Notes (Signed)
   01/20/18 0911  OBSTRUCTIVE SLEEP APNEA  Have you ever been diagnosed with sleep apnea through a sleep study? No  Do you snore loudly (loud enough to be heard through closed doors)?  0  Do you often feel tired, fatigued, or sleepy during the daytime (such as falling asleep during driving or talking to someone)? 1  Has anyone observed you stop breathing during your sleep? 1  Do you have, or are you being treated for high blood pressure? 1  BMI more than 35 kg/m2? 0  Age > 50 (1-yes) 1  Neck circumference greater than:Male 16 inches or larger, Male 17inches or larger? 0  Male Gender (Yes=1) 1  Obstructive Sleep Apnea Score 5  Score 5 or greater  Results sent to PCP

## 2018-01-21 NOTE — Progress Notes (Addendum)
Anesthesia Chart Review: Patient is a 62 year old male scheduled for posterior cervical fusion with lateral mass fixation, C6-7 on 01/27/18 by Dr. Kary Kos.  PMH includes smoking, HTN, hyperlipidemia, dysrhythmia (pt reports irregular heartbeat in the 80's), GERD, dyspnea, dysphagia, esophageal dilatation 05/16/17, headaches, BPH, seizure (age ~ 62 years old), arthritis, nasal septum surgery, C3-7 ACDF 04/10/15, lipoma excision (x2) 10/21/13.  - PCP is Dr. Lucia Gaskins who is an internist in [place], but also completed a Cardiology fellowship. - GI is Dr. Hildred Laser. EGD 05/16/17 showed normal esophagus, 2 cm hiatal hernia. Irregular Z-line found 38 cm from the incisors. No endoscopic abnormality to explain patient's complaint of dysphagia.  Decision to proceed with dilation of the entire esophagus.  Meds include amlodipine, Ativan, oxycodone, pravastatin.  BP (!) 143/82   Pulse 79   Temp 36.8 C   Resp 20   Ht 5\' 11"  (1.803 m)   Wt 159 lb 12.8 oz (72.5 kg)   SpO2 97%   BMI 22.29 kg/m   EKG 05/13/17: Sinus rhythm, LVH, cannot rule out septal infarct (age undermined). Septal Q waves (QR pattern) are new when compared to 04/07/15 and 10/23/13 tracings.   Patient reported seeing a cardiologist ~ 20 years ago with a stress test at that time. He denied prior echo or cardiac cath.  CXR 02/26/17: IMPRESSION: No active cardiopulmonary disease.  CT c-spine 12/19/17: IMPRESSION: 1. No acute fracture or malalignment. 2. Status post C3 through C7 ACDF. C3-4 through C5-6 arthrodesis. C6-7 pseudoarthrosis. 3. Mild canal stenosis C3-4. 4. Neural foraminal narrowing C2-3 through C6-7: Severe on the RIGHT at C3-4 and severe bilaterally at C6-7. Emphysema (ICD10-J43.9).  Preoperative labs reviewed. Cr 0.89, glucose 94. CBC WNL.T& S done.  I reviewed EKG tracings and history with anesthesiologist Dr. Albertha Ghee. We will ask Dr. Cindie Laroche to review for preoperative recommendations.   [facility] Short Stay Center/Anesthesiology Phone [phone] 01/21/2018 5:32 PM  Addendum: I spoke with Charlene at Dr. Denita Lung office. My note and three EKGs received via fax. She reports patient was seen just last week but unsure if Dr. Cindie Laroche is aware of surgery plans. She will review with him to determine if he thinks patient needs to come in for re-evaluation or will require any additional testing. I have updated Lorriane Shire at Dr. Windy Carina office. (Update 01/22/18 10:55 PM: Randell Patient reported that Dr. Cindie Laroche would like patient re-evaluated prior to clearing for surgery. He is not sure that he can see patient prior to surgery date, but they will work on getting him an appointment. Lorriane Shire will also contact patient to let him know that he will need clearance prior to surgery--disucssed that clearance could come from Dr. Cindie Laroche or he could see another cardiologist if desired. Will leave char for follow-up regarding clearance status.)  George [facility] Short Stay Center/Anesthesiology Phone [phone] 01/22/2018 9:30 AM

## 2018-01-31 NOTE — Progress Notes (Signed)
Anesthesia Follow-up: Patient is a 62 year old male scheduled for posterior cervical fusion with lateral mass fixation, C6-7 on 02/05/18 by Dr. Kary Kos. Surgery was initially scheduled for 01/27/18, but was delayed for re-evaluation by his PCP Dr. Lucia Gaskins. (See my note from 01/21/18 with addendum 01/22/18.) Since then patient was seen by Dr. Cindie Laroche on 01/27/18. He reviewed records and EKG sent from PAT. Following a repeat EKG and an echocardiogram, he wrote, "Pt is at no increased risk for perioperative cardiovascular event."  EKG 01/27/18 (Dr. Cindie Laroche): NSR, minimal voltage criteria for LVH, may be normal variant. I think also possible findings of septal infarct (versus low r waves) which are less distinct when compared to 05/13/17 tracing.  Echo 01/27/18 (Dr. Cindie Laroche): Left atrium appears normal.  Aortic root appears normal.  Left ventricular chamber dimensions appear normal.  Left ventricular wall thickness appeared normal.  Left ventricular systolic function appears grossly normal.  Paradoxical septal movement is not visible. EF 55%.   He had labs on 01/20/18.   Based on currently available information, I anticipate that he can proceed as planned if no acute changes.  [facility] Short Stay Center/Anesthesiology Phone [phone] 01/31/2018 3:22 PM

## 2018-02-04 ENCOUNTER — Other Ambulatory Visit: Payer: Self-pay

## 2018-02-04 ENCOUNTER — Encounter (HOSPITAL_COMMUNITY): Payer: Self-pay | Admitting: *Deleted

## 2018-02-04 NOTE — Progress Notes (Signed)
Spoke with pt for pre-op call. Pt had a PAT appt on 01/20/18. Surgery was moved due to pt needing cardiac clearance. Clearance note in chart dated 01/27/18. Echo report and EKG in chart also. Pt denies any change in his medical and surgical history. States his medications are the same.

## 2018-02-05 ENCOUNTER — Inpatient Hospital Stay (HOSPITAL_COMMUNITY): Payer: Medicaid Other

## 2018-02-05 ENCOUNTER — Inpatient Hospital Stay (HOSPITAL_COMMUNITY)
Admission: RE | Admit: 2018-02-05 | Discharge: 2018-02-07 | DRG: 473 | Disposition: A | Discharge status: Home / Self Care | Payer: Medicaid Other | Source: Ambulatory Visit | Attending: Neurosurgery | Admitting: Neurosurgery

## 2018-02-05 ENCOUNTER — Inpatient Hospital Stay (HOSPITAL_COMMUNITY): Payer: Medicaid Other | Admitting: Vascular Surgery

## 2018-02-05 ENCOUNTER — Encounter (HOSPITAL_COMMUNITY): Payer: Self-pay | Admitting: Certified Registered Nurse Anesthetist

## 2018-02-05 ENCOUNTER — Inpatient Hospital Stay (HOSPITAL_COMMUNITY): Payer: Medicaid Other | Admitting: Emergency Medicine

## 2018-02-05 ENCOUNTER — Encounter (HOSPITAL_COMMUNITY)
Admission: RE | Disposition: A | Discharge status: Home / Self Care | Payer: Self-pay | Source: Ambulatory Visit | Attending: Neurosurgery

## 2018-02-05 DIAGNOSIS — R55 Syncope and collapse: Secondary | ICD-10-CM | POA: Diagnosis not present

## 2018-02-05 DIAGNOSIS — N400 Enlarged prostate without lower urinary tract symptoms: Secondary | ICD-10-CM | POA: Diagnosis present

## 2018-02-05 DIAGNOSIS — I10 Essential (primary) hypertension: Secondary | ICD-10-CM | POA: Diagnosis present

## 2018-02-05 DIAGNOSIS — Y829 Unspecified medical devices associated with adverse incidents: Secondary | ICD-10-CM | POA: Diagnosis present

## 2018-02-05 DIAGNOSIS — K219 Gastro-esophageal reflux disease without esophagitis: Secondary | ICD-10-CM | POA: Diagnosis present

## 2018-02-05 DIAGNOSIS — F17210 Nicotine dependence, cigarettes, uncomplicated: Secondary | ICD-10-CM | POA: Diagnosis present

## 2018-02-05 DIAGNOSIS — E785 Hyperlipidemia, unspecified: Secondary | ICD-10-CM | POA: Diagnosis present

## 2018-02-05 DIAGNOSIS — Z79899 Other long term (current) drug therapy: Secondary | ICD-10-CM

## 2018-02-05 DIAGNOSIS — M542 Cervicalgia: Secondary | ICD-10-CM | POA: Diagnosis present

## 2018-02-05 DIAGNOSIS — M960 Pseudarthrosis after fusion or arthrodesis: Principal | ICD-10-CM | POA: Diagnosis present

## 2018-02-05 DIAGNOSIS — Z419 Encounter for procedure for purposes other than remedying health state, unspecified: Secondary | ICD-10-CM

## 2018-02-05 DIAGNOSIS — Y831 Surgical operation with implant of artificial internal device as the cause of abnormal reaction of the patient, or of later complication, without mention of misadventure at the time of the procedure: Secondary | ICD-10-CM | POA: Diagnosis present

## 2018-02-05 DIAGNOSIS — S129XXA Fracture of neck, unspecified, initial encounter: Secondary | ICD-10-CM | POA: Diagnosis present

## 2018-02-05 HISTORY — PX: POSTERIOR CERVICAL FUSION/FORAMINOTOMY: SHX5038

## 2018-02-05 LAB — BASIC METABOLIC PANEL
Anion gap: 10 (ref 5–15)
BUN: 9 mg/dL (ref 6–20)
CALCIUM: 8.6 mg/dL — AB (ref 8.9–10.3)
CO2: 23 mmol/L (ref 22–32)
CREATININE: 0.98 mg/dL (ref 0.61–1.24)
Chloride: 107 mmol/L (ref 101–111)
GFR calc non Af Amer: >60 mL/min (ref >60)
Glucose, Bld: 92 mg/dL (ref 65–99)
Potassium: 3.6 mmol/L (ref 3.5–5.1)
Sodium: 140 mmol/L (ref 135–145)

## 2018-02-05 LAB — CBC
HCT: 42.4 % (ref 39.0–52.0)
Hemoglobin: 14.1 g/dL (ref 13.0–17.0)
MCH: 30.2 pg (ref 26.0–34.0)
MCHC: 33.3 g/dL (ref 30.0–36.0)
MCV: 90.8 fL (ref 78.0–100.0)
PLATELETS: 311 10*3/uL (ref 150–400)
RBC: 4.67 MIL/uL (ref 4.22–5.81)
RDW: 15.1 % (ref 11.5–15.5)
WBC: 7 10*3/uL (ref 4.0–10.5)

## 2018-02-05 LAB — TYPE AND SCREEN
ABO/RH(D): O POS
Antibody Screen: NEGATIVE

## 2018-02-05 SURGERY — POSTERIOR CERVICAL FUSION/FORAMINOTOMY LEVEL 1
Anesthesia: General | Site: Neck

## 2018-02-05 MED ORDER — BUPIVACAINE HCL (PF) 0.25 % IJ SOLN
INTRAMUSCULAR | Status: AC
  Filled 2018-02-05: qty 30

## 2018-02-05 MED ORDER — MEPERIDINE HCL 50 MG/ML IJ SOLN: 6.2500 mg | INTRAMUSCULAR | Status: DC | PRN

## 2018-02-05 MED ORDER — HYDROMORPHONE HCL 1 MG/ML IJ SOLN
0.2500 mg | INTRAMUSCULAR | Status: DC | PRN
  Administered 2018-02-05 (×2): 0.5 mg via INTRAVENOUS

## 2018-02-05 MED ORDER — ROCURONIUM BROMIDE 10 MG/ML (PF) SYRINGE
PREFILLED_SYRINGE | INTRAVENOUS | Status: DC | PRN
  Administered 2018-02-05: 50 mg via INTRAVENOUS
  Administered 2018-02-05: 10 mg via INTRAVENOUS
  Administered 2018-02-05: 20 mg via INTRAVENOUS

## 2018-02-05 MED ORDER — ARTIFICIAL TEARS OPHTHALMIC OINT
TOPICAL_OINTMENT | OPHTHALMIC | Status: AC
  Filled 2018-02-05: qty 3.5

## 2018-02-05 MED ORDER — SUCCINYLCHOLINE CHLORIDE 200 MG/10ML IV SOSY
PREFILLED_SYRINGE | INTRAVENOUS | Status: AC
  Filled 2018-02-05: qty 10

## 2018-02-05 MED ORDER — ONDANSETRON HCL 4 MG/2ML IJ SOLN: 4.0000 mg | Freq: Once | INTRAMUSCULAR | Status: DC | PRN

## 2018-02-05 MED ORDER — PHENOL 1.4 % MT LIQD: 1.0000 | OROMUCOSAL | Status: DC | PRN

## 2018-02-05 MED ORDER — VANCOMYCIN HCL 1000 MG IV SOLR
INTRAVENOUS | Status: DC | PRN
  Administered 2018-02-05: 1000 mg via TOPICAL

## 2018-02-05 MED ORDER — MIDAZOLAM HCL 2 MG/2ML IJ SOLN
INTRAMUSCULAR | Status: AC
  Filled 2018-02-05: qty 2

## 2018-02-05 MED ORDER — PRAVASTATIN SODIUM 40 MG PO TABS
40.0000 mg | ORAL_TABLET | Freq: Every day | ORAL | Status: DC
  Administered 2018-02-05 – 2018-02-06 (×2): 40 mg via ORAL
  Filled 2018-02-05 (×2): qty 1

## 2018-02-05 MED ORDER — THROMBIN (RECOMBINANT) 20000 UNITS EX SOLR
CUTANEOUS | Status: DC | PRN
  Administered 2018-02-05: 10:00:00 via TOPICAL

## 2018-02-05 MED ORDER — DEXTROSE 5 % IV SOLN
INTRAVENOUS | Status: DC | PRN
  Administered 2018-02-05: 20 ug/min via INTRAVENOUS

## 2018-02-05 MED ORDER — 0.9 % SODIUM CHLORIDE (POUR BTL) OPTIME
TOPICAL | Status: DC | PRN
  Administered 2018-02-05: 1000 mL

## 2018-02-05 MED ORDER — LIDOCAINE 2% (20 MG/ML) 5 ML SYRINGE
INTRAMUSCULAR | Status: DC | PRN
  Administered 2018-02-05: 100 mg via INTRAVENOUS

## 2018-02-05 MED ORDER — EPHEDRINE 5 MG/ML INJ
INTRAVENOUS | Status: AC
  Filled 2018-02-05: qty 10

## 2018-02-05 MED ORDER — ACETAMINOPHEN 650 MG RE SUPP: 650.0000 mg | RECTAL | Status: DC | PRN

## 2018-02-05 MED ORDER — LIDOCAINE-EPINEPHRINE 1 %-1:100000 IJ SOLN
INTRAMUSCULAR | Status: AC
  Filled 2018-02-05: qty 1

## 2018-02-05 MED ORDER — SODIUM CHLORIDE 0.9 % IR SOLN
Status: DC | PRN
  Administered 2018-02-05: 10:00:00

## 2018-02-05 MED ORDER — SUGAMMADEX SODIUM 200 MG/2ML IV SOLN
INTRAVENOUS | Status: DC | PRN
  Administered 2018-02-05: 150 mg via INTRAVENOUS

## 2018-02-05 MED ORDER — CEFAZOLIN SODIUM-DEXTROSE 2-4 GM/100ML-% IV SOLN
2.0000 g | Freq: Three times a day (TID) | INTRAVENOUS | Status: AC
  Administered 2018-02-05 (×2): 2 g via INTRAVENOUS
  Filled 2018-02-05 (×2): qty 100

## 2018-02-05 MED ORDER — LIDOCAINE-EPINEPHRINE 1 %-1:100000 IJ SOLN
INTRAMUSCULAR | Status: DC | PRN
  Administered 2018-02-05: 4 mL

## 2018-02-05 MED ORDER — FENTANYL CITRATE (PF) 250 MCG/5ML IJ SOLN
INTRAMUSCULAR | Status: AC
  Filled 2018-02-05: qty 5

## 2018-02-05 MED ORDER — THROMBIN (RECOMBINANT) 5000 UNITS EX SOLR
CUTANEOUS | Status: AC
  Filled 2018-02-05: qty 5000

## 2018-02-05 MED ORDER — MIDAZOLAM HCL 2 MG/2ML IJ SOLN
INTRAMUSCULAR | Status: DC | PRN
  Administered 2018-02-05: 2 mg via INTRAVENOUS

## 2018-02-05 MED ORDER — ARTIFICIAL TEARS OPHTHALMIC OINT
TOPICAL_OINTMENT | OPHTHALMIC | Status: DC | PRN
  Administered 2018-02-05: 1 via OPHTHALMIC

## 2018-02-05 MED ORDER — SODIUM CHLORIDE 0.9% FLUSH
3.0000 mL | Freq: Two times a day (BID) | INTRAVENOUS | Status: DC
  Administered 2018-02-05 – 2018-02-06 (×3): 3 mL via INTRAVENOUS

## 2018-02-05 MED ORDER — SODIUM CHLORIDE 0.9% FLUSH: 3.0000 mL | INTRAVENOUS | Status: DC | PRN

## 2018-02-05 MED ORDER — FENTANYL CITRATE (PF) 250 MCG/5ML IJ SOLN
INTRAMUSCULAR | Status: DC | PRN
  Administered 2018-02-05: 50 ug via INTRAVENOUS
  Administered 2018-02-05: 150 ug via INTRAVENOUS
  Administered 2018-02-05: 50 ug via INTRAVENOUS

## 2018-02-05 MED ORDER — AMLODIPINE BESYLATE 5 MG PO TABS
5.0000 mg | ORAL_TABLET | Freq: Every day | ORAL | Status: DC
  Administered 2018-02-05: 5 mg via ORAL
  Filled 2018-02-05: qty 1

## 2018-02-05 MED ORDER — CEFAZOLIN SODIUM-DEXTROSE 2-4 GM/100ML-% IV SOLN
2.0000 g | INTRAVENOUS | Status: AC
  Administered 2018-02-05: 2 g via INTRAVENOUS
  Filled 2018-02-05: qty 100

## 2018-02-05 MED ORDER — ONDANSETRON HCL 4 MG PO TABS: 4.0000 mg | ORAL_TABLET | Freq: Four times a day (QID) | ORAL | Status: DC | PRN

## 2018-02-05 MED ORDER — ACETAMINOPHEN 325 MG PO TABS: 650.0000 mg | ORAL_TABLET | ORAL | Status: DC | PRN

## 2018-02-05 MED ORDER — MENTHOL 3 MG MT LOZG: 1.0000 | LOZENGE | OROMUCOSAL | Status: DC | PRN

## 2018-02-05 MED ORDER — ONDANSETRON HCL 4 MG/2ML IJ SOLN: 4.0000 mg | Freq: Four times a day (QID) | INTRAMUSCULAR | Status: DC | PRN

## 2018-02-05 MED ORDER — LACTATED RINGERS IV SOLN
INTRAVENOUS | Status: DC | PRN
  Administered 2018-02-05: 08:00:00 via INTRAVENOUS

## 2018-02-05 MED ORDER — ROCURONIUM BROMIDE 10 MG/ML (PF) SYRINGE
PREFILLED_SYRINGE | INTRAVENOUS | Status: AC
  Filled 2018-02-05: qty 5

## 2018-02-05 MED ORDER — OXYCODONE HCL 5 MG PO TABS
10.0000 mg | ORAL_TABLET | Freq: Two times a day (BID) | ORAL | Status: DC | PRN
  Administered 2018-02-05 – 2018-02-07 (×5): 10 mg via ORAL
  Filled 2018-02-05 (×5): qty 2

## 2018-02-05 MED ORDER — DEXAMETHASONE SODIUM PHOSPHATE 10 MG/ML IJ SOLN
10.0000 mg | INTRAMUSCULAR | Status: AC
  Administered 2018-02-05: 10 mg via INTRAVENOUS
  Filled 2018-02-05: qty 1

## 2018-02-05 MED ORDER — DEXAMETHASONE SODIUM PHOSPHATE 10 MG/ML IJ SOLN
INTRAMUSCULAR | Status: AC
  Filled 2018-02-05: qty 1

## 2018-02-05 MED ORDER — PHENYLEPHRINE 40 MCG/ML (10ML) SYRINGE FOR IV PUSH (FOR BLOOD PRESSURE SUPPORT)
PREFILLED_SYRINGE | INTRAVENOUS | Status: AC
  Filled 2018-02-05: qty 10

## 2018-02-05 MED ORDER — THROMBIN (RECOMBINANT) 20000 UNITS EX SOLR
CUTANEOUS | Status: AC
  Filled 2018-02-05: qty 20000

## 2018-02-05 MED ORDER — LORAZEPAM 0.5 MG PO TABS
1.0000 mg | ORAL_TABLET | Freq: Every day | ORAL | Status: DC
  Administered 2018-02-05: 1 mg via ORAL
  Filled 2018-02-05: qty 2

## 2018-02-05 MED ORDER — PROPOFOL 10 MG/ML IV BOLUS
INTRAVENOUS | Status: DC | PRN
  Administered 2018-02-05: 30 mg via INTRAVENOUS
  Administered 2018-02-05: 120 mg via INTRAVENOUS

## 2018-02-05 MED ORDER — CHLORHEXIDINE GLUCONATE CLOTH 2 % EX PADS: 6.0000 | MEDICATED_PAD | Freq: Once | CUTANEOUS | Status: DC

## 2018-02-05 MED ORDER — HYDROMORPHONE HCL 1 MG/ML IJ SOLN
INTRAMUSCULAR | Status: AC
  Filled 2018-02-05: qty 1

## 2018-02-05 MED ORDER — BACITRACIN ZINC 500 UNIT/GM EX OINT
TOPICAL_OINTMENT | CUTANEOUS | Status: DC | PRN
  Administered 2018-02-05: 1 via TOPICAL

## 2018-02-05 MED ORDER — BACITRACIN ZINC 500 UNIT/GM EX OINT
TOPICAL_OINTMENT | CUTANEOUS | Status: AC
  Filled 2018-02-05: qty 28.35

## 2018-02-05 MED ORDER — VANCOMYCIN HCL 1000 MG IV SOLR
INTRAVENOUS | Status: AC
  Filled 2018-02-05: qty 1000

## 2018-02-05 MED ORDER — ONDANSETRON HCL 4 MG/2ML IJ SOLN
INTRAMUSCULAR | Status: AC
  Filled 2018-02-05: qty 2

## 2018-02-05 MED ORDER — BUPIVACAINE LIPOSOME 1.3 % IJ SUSP
20.0000 mL | INTRAMUSCULAR | Status: AC
  Administered 2018-02-05: 20 mL
  Filled 2018-02-05: qty 20

## 2018-02-05 MED ORDER — ONDANSETRON HCL 4 MG/2ML IJ SOLN
INTRAMUSCULAR | Status: DC | PRN
  Administered 2018-02-05: 4 mg via INTRAVENOUS

## 2018-02-05 MED ORDER — VANCOMYCIN HCL IN DEXTROSE 1-5 GM/200ML-% IV SOLN
INTRAVENOUS | Status: AC
  Filled 2018-02-05: qty 200

## 2018-02-05 MED ORDER — PROPOFOL 10 MG/ML IV BOLUS
INTRAVENOUS | Status: AC
Start: 2018-02-05 — End: ?
  Filled 2018-02-05: qty 20

## 2018-02-05 MED ORDER — HYDROMORPHONE HCL 1 MG/ML IJ SOLN: 0.2500 mg | INTRAMUSCULAR | Status: DC | PRN

## 2018-02-05 SURGICAL SUPPLY — 72 items
BAG DECANTER FOR FLEXI CONT (MISCELLANEOUS) ×2 IMPLANT
BENZOIN TINCTURE PRP APPL 2/3 (GAUZE/BANDAGES/DRESSINGS) ×2 IMPLANT
BIT DRILL SCRW 3.5 (BIT) ×2 IMPLANT
BLADE CLIPPER SURG (BLADE) IMPLANT
BLADE SURG 11 STRL SS (BLADE) ×2 IMPLANT
BUR MATCHSTICK NEURO 3.0 LAGG (BURR) ×2 IMPLANT
CANISTER SUCT 3000ML PPV (MISCELLANEOUS) ×2 IMPLANT
CAP LOCKING (Cap) ×4 IMPLANT
CARTRIDGE OIL MAESTRO DRILL (MISCELLANEOUS) ×1 IMPLANT
DECANTER SPIKE VIAL GLASS SM (MISCELLANEOUS) ×2 IMPLANT
DERMABOND ADVANCED (GAUZE/BANDAGES/DRESSINGS) ×1
DERMABOND ADVANCED .7 DNX12 (GAUZE/BANDAGES/DRESSINGS) ×1 IMPLANT
DIFFUSER DRILL AIR PNEUMATIC (MISCELLANEOUS) ×2 IMPLANT
DRAPE C-ARM 42X72 X-RAY (DRAPES) ×6 IMPLANT
DRAPE LAPAROTOMY 100X72 PEDS (DRAPES) ×2 IMPLANT
DRAPE MICROSCOPE LEICA (MISCELLANEOUS) IMPLANT
DRAPE POUCH INSTRU U-SHP 10X18 (DRAPES) ×2 IMPLANT
DRAPE SURG 17X23 STRL (DRAPES) ×4 IMPLANT
DRSG OPSITE POSTOP 4X6 (GAUZE/BANDAGES/DRESSINGS) ×2 IMPLANT
DURAPREP 26ML APPLICATOR (WOUND CARE) ×2 IMPLANT
ELECT REM PT RETURN 9FT ADLT (ELECTROSURGICAL) ×2
ELECTRODE REM PT RTRN 9FT ADLT (ELECTROSURGICAL) ×1 IMPLANT
FIBER CORTICAL ALLOFUSE 5CC (Bone Implant) ×2 IMPLANT
GAUZE SPONGE 4X4 12PLY STRL (GAUZE/BANDAGES/DRESSINGS) IMPLANT
GAUZE SPONGE 4X4 16PLY XRAY LF (GAUZE/BANDAGES/DRESSINGS) IMPLANT
GLOVE BIO SURGEON STRL SZ7 (GLOVE) ×2 IMPLANT
GLOVE BIO SURGEON STRL SZ8 (GLOVE) ×2 IMPLANT
GLOVE BIOGEL PI IND STRL 7.0 (GLOVE) ×1 IMPLANT
GLOVE BIOGEL PI IND STRL 8 (GLOVE) ×2 IMPLANT
GLOVE BIOGEL PI INDICATOR 7.0 (GLOVE) ×1
GLOVE BIOGEL PI INDICATOR 8 (GLOVE) ×2
GLOVE ECLIPSE 6.5 STRL STRAW (GLOVE) ×2 IMPLANT
GLOVE ECLIPSE 7.5 STRL STRAW (GLOVE) ×4 IMPLANT
GLOVE EXAM NITRILE LRG STRL (GLOVE) IMPLANT
GLOVE EXAM NITRILE XL STR (GLOVE) IMPLANT
GLOVE EXAM NITRILE XS STR PU (GLOVE) IMPLANT
GLOVE INDICATOR 8.5 STRL (GLOVE) ×2 IMPLANT
GOWN STRL REUS W/ TWL LRG LVL3 (GOWN DISPOSABLE) ×2 IMPLANT
GOWN STRL REUS W/ TWL XL LVL3 (GOWN DISPOSABLE) ×1 IMPLANT
GOWN STRL REUS W/TWL 2XL LVL3 (GOWN DISPOSABLE) ×2 IMPLANT
GOWN STRL REUS W/TWL LRG LVL3 (GOWN DISPOSABLE) ×2
GOWN STRL REUS W/TWL XL LVL3 (GOWN DISPOSABLE) ×1
IMPL QUARTEX 3.5X14MM (Neuro Prosthesis/Implant) ×4 IMPLANT
IMPLANT QUARTEX 3.5X14MM (Neuro Prosthesis/Implant) ×8 IMPLANT
KIT BASIN OR (CUSTOM PROCEDURE TRAY) ×2 IMPLANT
KIT ROOM TURNOVER OR (KITS) ×2 IMPLANT
LOCKING CAP (Cap) ×8 IMPLANT
MARKER SKIN DUAL TIP RULER LAB (MISCELLANEOUS) ×2 IMPLANT
NEEDLE HYPO 21X1.5 SAFETY (NEEDLE) ×2 IMPLANT
NEEDLE HYPO 25X1 1.5 SAFETY (NEEDLE) ×2 IMPLANT
NEEDLE SPNL 20GX3.5 QUINCKE YW (NEEDLE) ×2 IMPLANT
NS IRRIG 1000ML POUR BTL (IV SOLUTION) ×2 IMPLANT
OIL CARTRIDGE MAESTRO DRILL (MISCELLANEOUS) ×2
PACK LAMINECTOMY NEURO (CUSTOM PROCEDURE TRAY) ×2 IMPLANT
PAD ARMBOARD 7.5X6 YLW CONV (MISCELLANEOUS) ×4 IMPLANT
PIN MAYFIELD SKULL DISP (PIN) ×2 IMPLANT
PUTTY BIOACTIVE 2CC KINEX (Miscellaneous) ×2 IMPLANT
ROD CVD 4.0X30 (Rod) ×4 IMPLANT
RUBBERBAND STERILE (MISCELLANEOUS) IMPLANT
SPONGE LAP 4X18 X RAY DECT (DISPOSABLE) IMPLANT
SPONGE SURGIFOAM ABS GEL 100 (HEMOSTASIS) ×2 IMPLANT
STRIP CLOSURE SKIN 1/2X4 (GAUZE/BANDAGES/DRESSINGS) ×2 IMPLANT
SUT ETHILON 4 0 PS 2 18 (SUTURE) IMPLANT
SUT VIC AB 0 CT1 18XCR BRD8 (SUTURE) ×1 IMPLANT
SUT VIC AB 0 CT1 8-18 (SUTURE) ×1
SUT VIC AB 2-0 CT1 18 (SUTURE) ×2 IMPLANT
SUT VIC AB 4-0 PS2 27 (SUTURE) ×2 IMPLANT
SYR 20CC LL (SYRINGE) ×2 IMPLANT
TOWEL GREEN STERILE (TOWEL DISPOSABLE) ×2 IMPLANT
TOWEL GREEN STERILE FF (TOWEL DISPOSABLE) ×2 IMPLANT
TRAY FOLEY W/METER SILVER 16FR (SET/KITS/TRAYS/PACK) IMPLANT
WATER STERILE IRR 1000ML POUR (IV SOLUTION) ×2 IMPLANT

## 2018-02-05 NOTE — Anesthesia Procedure Notes (Signed)
Procedure Name: Intubation Date/Time: 02/05/2018 8:41 AM Performed by: Julieta Bellini, CRNA Pre-anesthesia Checklist: Patient identified, Emergency Drugs available, Suction available and Patient being monitored Patient Re-evaluated:Patient Re-evaluated prior to induction Oxygen Delivery Method: Circle system utilized Preoxygenation: Pre-oxygenation with 100% oxygen Induction Type: IV induction Ventilation: Mask ventilation without difficulty and Oral airway inserted - appropriate to patient size Laryngoscope Size: Mac and 4 Grade View: Grade I Tube type: Oral Tube size: 7.5 mm Number of attempts: 1 Airway Equipment and Method: Stylet Placement Confirmation: ETT inserted through vocal cords under direct vision,  positive ETCO2 and breath sounds checked- equal and bilateral Secured at: 24 cm Tube secured with: Tape Dental Injury: Teeth and Oropharynx as per pre-operative assessment

## 2018-02-05 NOTE — H&P (Signed)
Xavier Livingston is an 62 y.o. male.   Chief Complaint: Neck pain left arm pain HPI: Xavier Livingston status post C3-C7 anterior cervical fusion. Patient has had progressive worsening neck pain over last several months with some occasional pain do his left arm. Workup revealed pseudoarthrosis at C6-7. Due progressive cinical syndrome, imaging findings,and failure of conservative treatment, I have extensively gone over the risks and benefits of Of surgery as well as perioperative course expectations of outcome and alternatives to surgery he understands and agrees to forward.  Past Medical History:  Diagnosis Date  . Arthritis   . Dyspnea   . Dysrhythmia    irregular heartbeat in the 80's  . Enlarged prostate   . GERD (gastroesophageal reflux disease)   . Headache   . Hyperlipemia   . Hypertension   . Nocturia more than twice per night   . Seizures ([place])    last at age 89 or 62 years old  . Smoker within last 12 months   . Swallowing difficulty     Past Surgical History:  Procedure Laterality Date  . ANTERIOR CERVICAL DECOMPRESSION/DISCECTOMY FUSION 4 LEVELS N/A 04/11/2015   Procedure: Anterior Cervical Discectomy/ Decompression Fusion Cervical three-four, Cervical four-five, Cervical five-six, Cervical six- Xavier;  Surgeon: Kary Kos, MD;  Location: [facility];  Service: Neurosurgery;  Laterality: N/A;  . COLONOSCOPY N/A 08/18/2014   Procedure: COLONOSCOPY;  Surgeon: Rogene Houston, MD;  [facility];  Service: Endoscopy;  Laterality: N/A;  1030-moved to 1200 Ann to notify pt  . ESOPHAGEAL DILATION N/A 11/29/2016   Procedure: ESOPHAGEAL DILATION;  Surgeon: Rogene Houston, MD;  [facility];  Service: Endoscopy;  Laterality: N/A;  . ESOPHAGEAL DILATION N/A 05/16/2017   Procedure: ESOPHAGEAL DILATION;  Surgeon: Rogene Houston, MD;  [facility];  Service: Endoscopy;  Laterality: N/A;  . ESOPHAGOGASTRODUODENOSCOPY N/A 08/18/2014   Procedure:  ESOPHAGOGASTRODUODENOSCOPY (EGD);  Surgeon: Rogene Houston, MD;  [facility];  Service: Endoscopy;  Laterality: N/A;  . ESOPHAGOGASTRODUODENOSCOPY N/A 11/29/2016   Procedure: ESOPHAGOGASTRODUODENOSCOPY (EGD);  Surgeon: Rogene Houston, MD;  [facility];  Service: Endoscopy;  Laterality: N/A;  12:00  . ESOPHAGOGASTRODUODENOSCOPY (EGD) WITH PROPOFOL N/A 05/16/2017   Procedure: ESOPHAGOGASTRODUODENOSCOPY (EGD) WITH PROPOFOL;  Surgeon: Rogene Houston, MD;  [facility];  Service: Endoscopy;  Laterality: N/A;  8:30  . excision of mass of back    . MALONEY DILATION  08/18/2014   Procedure: MALONEY DILATION;  Surgeon: Rogene Houston, MD;  [facility];  Service: Endoscopy;;  . MASS EXCISION N/A 10/21/2013   Procedure: EXCISION MASSES X 2 ON BACK;  Surgeon: Scherry Ran, MD;  Location: [facility];  Service: General;  Laterality: N/A;  . NASAL SEPTUM SURGERY      History reviewed. No pertinent family history. Social History:  reports that he has been smoking cigarettes.  He has a 10.00 pack-year smoking history. he has never used smokeless tobacco. He reports that he drinks about 3.6 oz of alcohol per week. He reports that he uses drugs.  Allergies: No Known Allergies  Medications Prior to Admission  Medication Sig Dispense Refill  . amLODipine (NORVASC) 5 MG tablet Take 5 mg by mouth daily.     [name] LORazepam (ATIVAN) 1 MG tablet Take 1 mg by mouth at bedtime.    . Oxycodone HCl 10 MG TABS Take 10 mg by mouth 2 (two) times daily as needed (pain).    [name]  pravastatin (PRAVACHOL) 40 MG tablet Take 40 mg by mouth daily.    . pantoprazole (PROTONIX) 40 MG tablet Take 1 tablet (40 mg total) by mouth daily before breakfast. (Patient not taking: Reported on 01/09/2018) 30 tablet 5    Results for orders placed or performed during the hospital encounter of 02/05/18 (from the past 48 hour(s))  Basic metabolic panel     Status: Abnormal   Collection Time: 02/05/18   6:33 AM  Result Value Ref Range   Sodium 140 135 - 145 mmol/L   Potassium 3.6 3.5 - 5.1 mmol/L   Chloride 107 101 - 111 mmol/L   CO2 23 22 - 32 mmol/L   Glucose, Bld 92 65 - 99 mg/dL   BUN 9 6 - 20 mg/dL   Creatinine, Ser 0.98 0.61 - 1.24 mg/dL   Calcium 8.6 (L) 8.9 - 10.3 mg/dL   GFR calc non Af Amer >60 >60 mL/min   GFR calc Af Amer >60 >60 mL/min    Comment: (NOTE) The eGFR has been calculated using the CKD EPI equation. This calculation has not been validated in all clinical situations. eGFR's persistently <60 mL/min signify possible Chronic Kidney Disease.    Anion gap 10 5 - 15    Comment: Performed at [facility] [address] [place] [address]  CBC     Status: None   Collection Time: 02/05/18  6:33 AM  Result Value Ref Range   WBC 7.0 4.0 - 10.5 K/uL   RBC 4.67 4.22 - 5.81 MIL/uL   Hemoglobin 14.1 13.0 - 17.0 g/dL   HCT 42.4 39.0 - 52.0 %   MCV 90.8 78.0 - 100.0 fL   MCH 30.2 26.0 - 34.0 pg   MCHC 33.3 30.0 - 36.0 g/dL   RDW 15.1 11.5 - 15.5 %   Platelets 311 150 - 400 K/uL    Comment: Performed at [facility], [place] [address] [place], [place] [address]  Type and screen All Cardiac and thoracic surgeries, spinal fusions, myomectomies, craniotomies, colon & liver resections, total joint revisions, same day c-section with placenta previa or accreta.     Status: None (Preliminary result)   Collection Time: 02/05/18  6:41 AM  Result Value Ref Range   ABO/RH(D) O POS    Antibody Screen PENDING    Sample Expiration      02/08/2018 Performed at [facility], [place] [address], [place],  [address]    No results found.  Review of Systems  Musculoskeletal: Positive for joint pain and neck pain.  Neurological: Positive for sensory change.    Blood pressure 109/84, pulse (!) 55, temperature 98.2 F (36.8 C), temperature source Oral, resp. rate 20, height _0  (1.803 m), weight 72.1 kg (159 lb), SpO2 100 %. Physical Exam   Constitutional: He is oriented to person, place, and time. He appears well-developed.  HENT:  Head: Normocephalic.  Eyes: Pupils are equal, round, and reactive to light.  Neck: Normal range of motion.  GI: Soft.  Neurological: He is alert and oriented to person, place, and time. He has normal strength. GCS eye subscore is 4. GCS verbal subscore is 5. GCS motor subscore is 6.  Patient is awake and alert strength is 5 out of 5 deltoid, bicep, tricep, wrist flexion, wrist extend intrinsics.  Skin: Skin is warm and dry.     Assessment/Plan 62 year old gentleman presents for posterior cervical fusion C6-7.  Harjot Dibello P, MD 02/05/2018, 7:53 AM

## 2018-02-05 NOTE — Transfer of Care (Signed)
Immediate Anesthesia Transfer of Care Note  Patient: Xavier Livingston  Procedure(s) Performed: Posterior Cervical Fusion with lateral mass fixation - Cervical six-seven (N/A Neck)  Patient Location: PACU  Anesthesia Type:General  Level of Consciousness: awake, alert , oriented and patient cooperative  Airway & Oxygen Therapy: Patient Spontanous Breathing  Post-op Assessment: Report given to RN, Post -op Vital signs reviewed and stable and Patient moving all extremities X 4  Post vital signs: Reviewed and stable  Last Vitals:  Vitals:   02/05/18 0630  BP: 109/84  Pulse: (!) 55  Resp: 20  Temp: 36.8 C  SpO2: 100%    Last Pain:  Vitals:   02/05/18 0715  TempSrc:   PainSc: 7       Patients Stated Pain Goal: 2 (59/56/38 7564)  Complications: No apparent anesthesia complications

## 2018-02-05 NOTE — Progress Notes (Signed)
Orthopedic Tech Progress Note Patient Details:  Xavier Livingston [birth year] [id number]  Ortho Devices Type of Ortho Device: Soft collar Ortho Device/Splint Location: neck Ortho Device/Splint Interventions: Application   Post Interventions Patient Tolerated: Well Instructions Provided: Care of device   [name] 02/05/2018, 10:46 AM

## 2018-02-05 NOTE — Op Note (Signed)
Preoperative diagnosis: Pseudoarthrosis C6-7  Postoperative diagnosis: Same  Procedure: Posterior cervical fusion C6-7 using utilizing the globus ellipse lateral mass screw system and posterior lateral arthrodesis C6-7 utilizing kinex and cortical fibers.  Surgeon: Dominica Severin Ovella Manygoats  Asst.  Ashok Pall  Anesthesia: Gen.  EBL: Minimal  History of present illness: 62 year old gentleman is purposely had a ACDF from C3-C7 did very well. However last several months of progress worsening neck pain and some occasional radiation down his left arm consistent with C7 nerve root pattern. Workup revealed solid fusion from C3-C6 with a pseudoarthrosis C6-7. Due to patient's progression of clinical syndrome imaging findings failure conservative treatment I recommended posterior cervical fusion at C6-7. I extensively went over the risks and benefits of the operation with him as well as perioperative course expectations of outcome and alternatives surgery and he understands and agrees to proceed forward.  Operative procedure: Patient brought into the or was just on general anesthesia positioned prone in pins in the neck in slight flexion the biceps X prepped and draped in routine sterile fashion. An incision was drawn out over the posterior inferior aspect of the midline of his cervical spine this was infiltrated with 10 mL lidocaine with epi and incision was made and subperiosteal dissections care lamina of C6-C7 and T1. Interoperative x-ray did identify the appropriate level. So after lateral masses were exposed at C6-7 pilot holes were drilled with a high-speed drill and the inferomedial aspect lateral mass. Then 4 holes were drilled and 14 mm in depth with the TPS and the guide. Near cortex was tapped and 414 mm globus ellipse lateral mass screws are placed all screws excellent purchase then the wound scope was irrigated meticulous he states was maintained the lateral masses and facet joint at C6-7 was aggressively  decorticated. The cortical fibers and kinex was packed in the facet joint and along the lateral mass. Then 2 rods were selected anchoring nuts were placed all nuts were tightened down some additional cortical fiber bone graft material was packed alongside lateral to the rods. Then mitomycin Prater speckled in the wound X Burrell was injected and the fascia and the wounds closed in layers with after Vicryl running 4 subcuticular in the skin Dermabond benzo and Steri-Strips and a sterile dressing was applied. Patient recovered in stable condition. At the end of case all needle counts sponge counts were correct.

## 2018-02-05 NOTE — Anesthesia Postprocedure Evaluation (Signed)
Anesthesia Post Note  Patient: Xavier Livingston  Procedure(s) Performed: Posterior Cervical Fusion with lateral mass fixation - Cervical six-seven (N/A Neck)     Patient location during evaluation: PACU Anesthesia Type: General Level of consciousness: awake and alert Pain management: pain level controlled Vital Signs Assessment: post-procedure vital signs reviewed and stable Respiratory status: spontaneous breathing, nonlabored ventilation, respiratory function stable and patient connected to nasal cannula oxygen Cardiovascular status: blood pressure returned to baseline and stable Postop Assessment: no apparent nausea or vomiting Anesthetic complications: no    Last Vitals:  Vitals:   02/05/18 1115 02/05/18 1144  BP:  (!) 185/96  Pulse: 65 63  Resp: (!) 21 18  Temp: 36.7 C 36.5 C  SpO2: 95% 99%    Last Pain:  Vitals:   02/05/18 1144  TempSrc: Oral  PainSc:                  Dashon Mcintire DAVID

## 2018-02-05 NOTE — Progress Notes (Signed)
Patient stated his last marijuana use was on 02/04/2018.

## 2018-02-05 NOTE — Anesthesia Preprocedure Evaluation (Addendum)
Anesthesia Evaluation  Patient identified by MRN, date of birth, ID band Patient awake    Reviewed: Allergy & Precautions, NPO status , Patient's Chart, lab work & pertinent test results  Airway Mallampati: II  TM Distance: >3 FB Neck ROM: Full    Dental   Pulmonary Current Smoker,    Pulmonary exam normal        Cardiovascular hypertension, Pt. on medications Normal cardiovascular exam     Neuro/Psych Seizures -,     GI/Hepatic GERD  Medicated and Controlled,  Endo/Other    Renal/GU      Musculoskeletal   Abdominal   Peds  Hematology   Anesthesia Other Findings   Reproductive/Obstetrics                             Anesthesia Physical Anesthesia Plan  ASA: II  Anesthesia Plan: General   Post-op Pain Management:    Induction: Intravenous  PONV Risk Score and Plan: 1 and Ondansetron, Dexamethasone and Treatment may vary due to age or medical condition  Airway Management Planned: Oral ETT  Additional Equipment:   Intra-op Plan:   Post-operative Plan: Extubation in OR  Informed Consent: I have reviewed the patients History and Physical, chart, labs and discussed the procedure including the risks, benefits and alternatives for the proposed anesthesia with the patient or authorized representative who has indicated his/her understanding and acceptance.     Plan Discussed with: CRNA and Surgeon  Anesthesia Plan Comments:         Anesthesia Quick Evaluation

## 2018-02-06 LAB — CBC WITH DIFFERENTIAL/PLATELET
BASOS ABS: 0 10*3/uL (ref 0.0–0.1)
Basophils Relative: 0 %
EOS PCT: 1 %
Eosinophils Absolute: 0.1 10*3/uL (ref 0.0–0.7)
HEMATOCRIT: 45.1 % (ref 39.0–52.0)
Hemoglobin: 14.7 g/dL (ref 13.0–17.0)
LYMPHS ABS: 2.6 10*3/uL (ref 0.7–4.0)
LYMPHS PCT: 23 %
MCH: 29.8 pg (ref 26.0–34.0)
MCHC: 32.6 g/dL (ref 30.0–36.0)
MCV: 91.3 fL (ref 78.0–100.0)
MONO ABS: 1.3 10*3/uL — AB (ref 0.1–1.0)
Monocytes Relative: 11 %
NEUTROS ABS: 7.7 10*3/uL (ref 1.7–7.7)
Neutrophils Relative %: 65 %
Platelets: 310 10*3/uL (ref 150–400)
RBC: 4.94 MIL/uL (ref 4.22–5.81)
RDW: 15 % (ref 11.5–15.5)
WBC: 11.6 10*3/uL — ABNORMAL HIGH (ref 4.0–10.5)

## 2018-02-06 LAB — BASIC METABOLIC PANEL
ANION GAP: 14 (ref 5–15)
BUN: 6 mg/dL (ref 6–20)
CHLORIDE: 99 mmol/L — AB (ref 101–111)
CO2: 27 mmol/L (ref 22–32)
Calcium: 8.8 mg/dL — ABNORMAL LOW (ref 8.9–10.3)
Creatinine, Ser: 1.1 mg/dL (ref 0.61–1.24)
GFR calc Af Amer: >60 mL/min (ref >60)
Glucose, Bld: 135 mg/dL — ABNORMAL HIGH (ref 65–99)
POTASSIUM: 3.5 mmol/L (ref 3.5–5.1)
SODIUM: 140 mmol/L (ref 135–145)

## 2018-02-06 LAB — CK TOTAL AND CKMB (NOT AT ARMC)
CK, MB: 5.5 ng/mL — ABNORMAL HIGH (ref 0.5–5.0)
RELATIVE INDEX: 0.9 (ref 0.0–2.5)
Total CK: 630 U/L — ABNORMAL HIGH (ref 49–397)

## 2018-02-06 MED ORDER — DEXAMETHASONE SODIUM PHOSPHATE 10 MG/ML IJ SOLN
10.0000 mg | Freq: Four times a day (QID) | INTRAMUSCULAR | Status: AC
  Administered 2018-02-06 (×2): 10 mg via INTRAVENOUS
  Filled 2018-02-06 (×2): qty 1

## 2018-02-06 MED ORDER — CYCLOBENZAPRINE HCL 10 MG PO TABS
10.0000 mg | ORAL_TABLET | Freq: Three times a day (TID) | ORAL | Status: DC | PRN
  Administered 2018-02-06: 10 mg via ORAL
  Filled 2018-02-06: qty 1

## 2018-02-06 NOTE — Progress Notes (Signed)
Subjective: Patient reports Patient doing okay with regard to neck pain however had a near syncopal episode this morning associated with dizziness lightheadedness nausea blood pressure was stable however pulse became tachycardic.  Objective: Vital signs in last 24 hours: Temp:  [97.4 F (36.3 C)-99 F (37.2 C)] 97.6 F (36.4 C) (03/07 0654) Pulse Rate:  [63-80] 65 (03/07 0654) Resp:  [14-23] 18 (03/07 0654) BP: (112-185)/(67-104) 112/77 (03/07 0654) SpO2:  [95 %-100 %] 100 % (03/07 0654)  Intake/Output from previous day: 03/06 0701 - 03/07 0700 In: 1820 [P.O.:720; I.V.:1000] Out: 30 [Blood:30] Intake/Output this shift: No intake/output data recorded.  Incision clean dry and intact patient awake alert nonfocal exam heart rate blood pressure normal  Lab Results: Recent Labs    02/05/18 0633  WBC 7.0  HGB 14.1  HCT 42.4  PLT 311   BMET Recent Labs    02/05/18 0633  NA 140  K 3.6  CL 107  CO2 23  GLUCOSE 92  BUN 9  CREATININE 0.98  CALCIUM 8.6*    Studies/Results: Dg Cervical Spine 2-3 Views  Result Date: 02/05/2018 CLINICAL DATA:  C6-7 posterior fusion intraoperative fluoro spot images. EXAM: CERVICAL SPINE - 2-3 VIEW; DG C-ARM 61-120 MIN COMPARISON:  Cervical spine CT scan of December 19, 2017. FINDINGS: There is pre-existing anterior fusion from C3 through C7. The patient has undergone placement of pedicle screws at C6 and C7. Connecting rods are not yet placed. IMPRESSION: Status post pedicle screw placement bilaterally at C6 and C7 without immediate postprocedure complication. Electronically Signed   By: David  Martinique M.D.   On: 02/05/2018 10:09   Dg C-arm 1-60 Min  Result Date: 02/05/2018 CLINICAL DATA:  C6-7 posterior fusion intraoperative fluoro spot images. EXAM: CERVICAL SPINE - 2-3 VIEW; DG C-ARM 61-120 MIN COMPARISON:  Cervical spine CT scan of December 19, 2017. FINDINGS: There is pre-existing anterior fusion from C3 through C7. The patient has undergone  placement of pedicle screws at C6 and C7. Connecting rods are not yet placed. IMPRESSION: Status post pedicle screw placement bilaterally at C6 and C7 without immediate postprocedure complication. Electronically Signed   By: David  Martinique M.D.   On: 02/05/2018 10:09    Assessment/Plan: Postop day 1 posterior cervical fusion with near syncopal episode this morning. We'll check her electrolytes cardiac enzymes and EKG.  LOS: 1 day     Taiten Brawn P 02/06/2018, 7:06 AM

## 2018-02-07 ENCOUNTER — Encounter (HOSPITAL_COMMUNITY): Payer: Self-pay | Admitting: Neurosurgery

## 2018-02-07 MED ORDER — CYCLOBENZAPRINE HCL 10 MG PO TABS: 10.0000 mg | ORAL_TABLET | Freq: Three times a day (TID) | ORAL | 0 refills | Status: DC | PRN

## 2018-02-07 MED ORDER — OXYCODONE HCL 10 MG PO TABS: 10.0000 mg | ORAL_TABLET | Freq: Two times a day (BID) | ORAL | 0 refills | Status: DC | PRN

## 2018-02-07 NOTE — Discharge Instructions (Signed)

## 2018-02-07 NOTE — Progress Notes (Signed)
Patient alert and oriented, mae's well, voiding adequate amount of urine, swallowing without difficulty,  c/o mild pain at time of discharge. Patient discharged home with family. Script and discharged instructions given to patient. Patient and family stated understanding of instructions given. Patient has an appointment with Dr. Cram   

## 2018-02-07 NOTE — Discharge Summary (Signed)
Physician Discharge Summary  Patient ID: TARRIS BHAT MRN: [id number] DOB/AGE: 12/26/1955 62 y.o. Estimated body mass index is 22.18 kg/m as calculated from the following:   Height as of this encounter: 5\' 11"  (1.803 m).   Weight as of this encounter: 72.1 kg (159 lb).   Admit date: 02/05/2018 Discharge date: 02/07/2018  Admission Diagnoses: Pseudoarthrosis C6-7  Discharge Diagnoses: Samegood Active Problems:   Pseudoarthrosis of cervical spine [facility])   Discharged Condition: good  Hospital Course: Patient admitted the hospital underwent posterior cervical fusion C6-7 postoperatively patient did very well the first point postoperatively patient had a vasovagal episode workup with normal EKG and blood work that was essentially normal patient was hydrated and treated and responded very well. Second postoperative day patient did very well without any further episodes pain was well-managed he was ambulating and voiding spontaneously and stable for discharge home.  Consults: Significant Diagnostic Studies: Treatments: Posterior cervical C6-7 Discharge Exam: Blood pressure (!) 141/88, pulse 82, temperature 98.3 F (36.8 C), temperature source Oral, resp. rate 18, height 5\' 11"  (1.803 m), weight 72.1 kg (159 lb), SpO2 98 %. Strength out of 5 wound clean dry and intact  Disposition: Home  Discharge Instructions    Incentive spirometry RT   Complete by:  As directed         Signed: Kemper Heupel P 02/07/2018, 7:19 AM

## 2018-03-13 DIAGNOSIS — R2230 Localized swelling, mass and lump, unspecified upper limb: Secondary | ICD-10-CM | POA: Insufficient documentation

## 2018-03-20 ENCOUNTER — Other Ambulatory Visit: Payer: Self-pay | Admitting: Neurosurgery

## 2018-03-21 ENCOUNTER — Other Ambulatory Visit: Payer: Self-pay | Admitting: Neurosurgery

## 2018-03-21 DIAGNOSIS — R2232 Localized swelling, mass and lump, left upper limb: Secondary | ICD-10-CM

## 2018-03-23 IMAGING — CT CT CERVICAL SPINE W/O CM
2 series · 10 of 14 positions shown, 12 images · non-contrast
Comparison: MRI of the cervical spine November 11, 2017

CLINICAL DATA: LEFT neck pain radiating to LEFT arm and LEFT leg.
Difficulty turning head for 3 months. LEFT-sided numbness.

EXAM:
CT CERVICAL SPINE WITHOUT CONTRAST
TECHNIQUE: Multidetector CT imaging of the cervical spine was performed without
intravenous contrast. Multiplanar CT image reconstructions were also
generated.

[Series 3: cspine soft (person_name) · axial · 0.32mm/px · z∈[-243,-111]mm · 5 of 100 slices shown]
[im 17/100  soft-tissue]
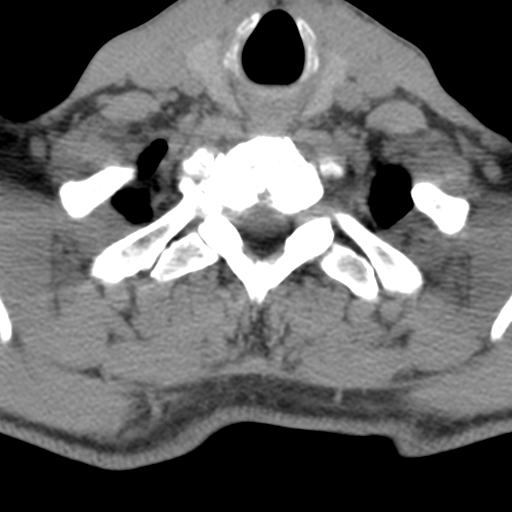
[im 34/100  soft-tissue]
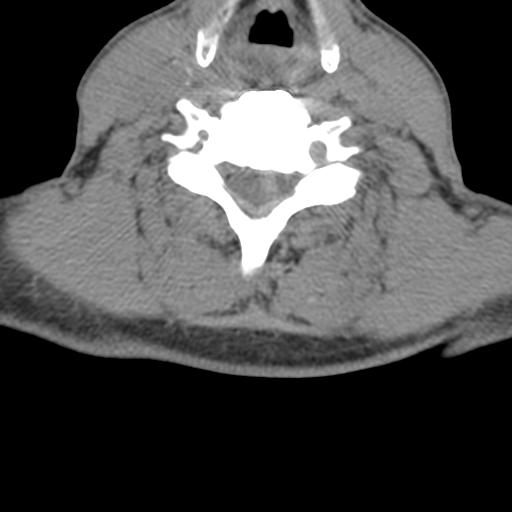
[im 50/100  soft-tissue]
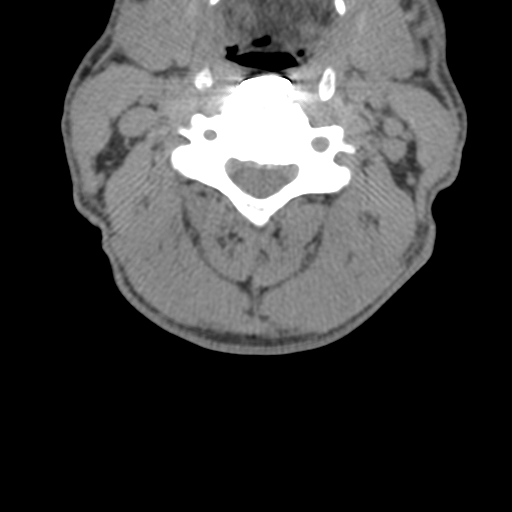
[im 67/100  soft-tissue]
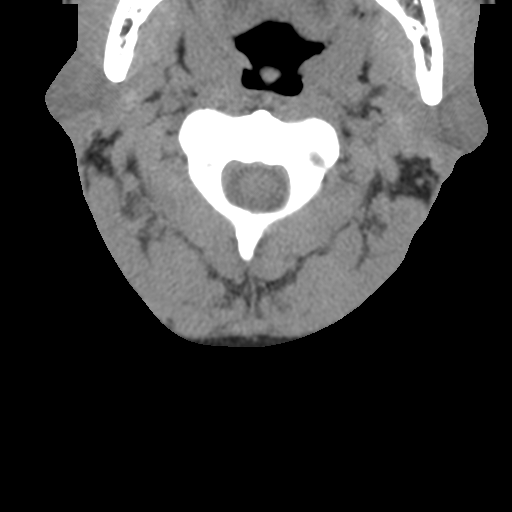
[im 83/100  soft-tissue]
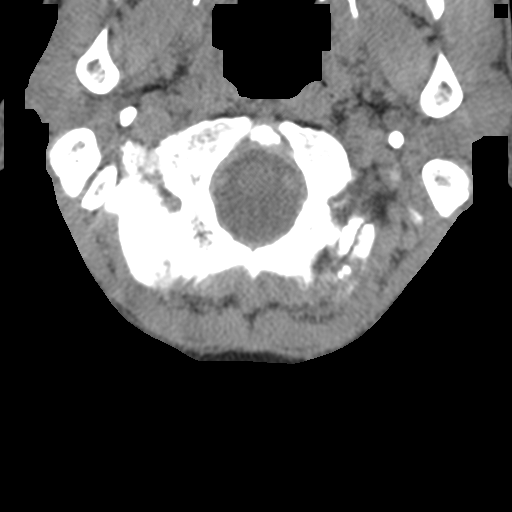

[Series 9: angled axial · axial · 0.29mm/px · z∈[-254,-119]mm · 5 of 104 slices shown, 7 images]
[im 18/104  soft-tissue]
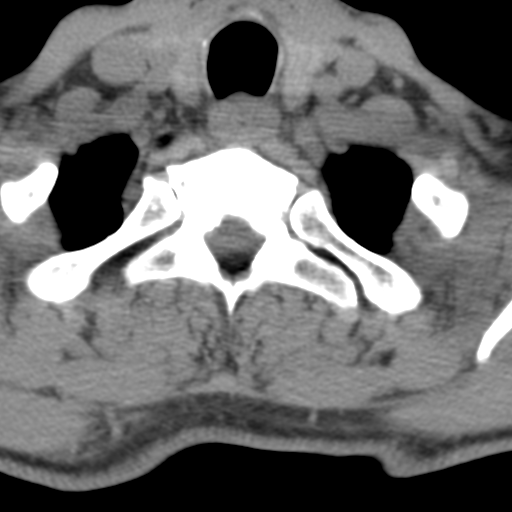
[im 18/104  bone]
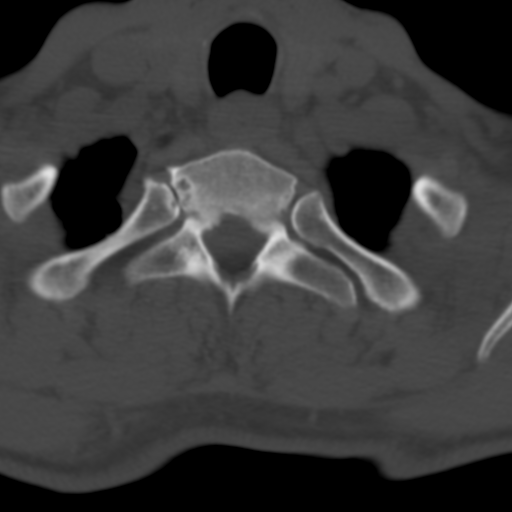
[im 35/104  bone]
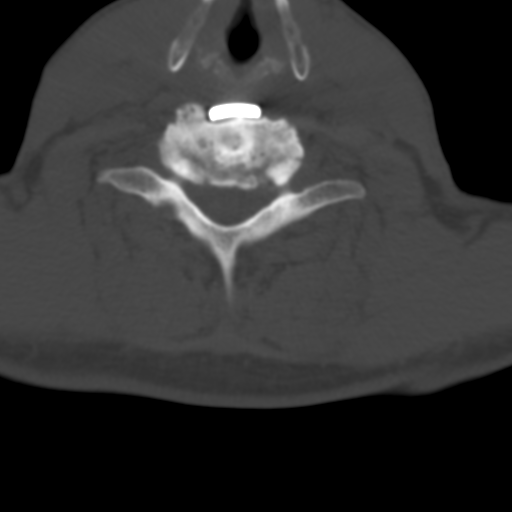
[im 52/104  bone]
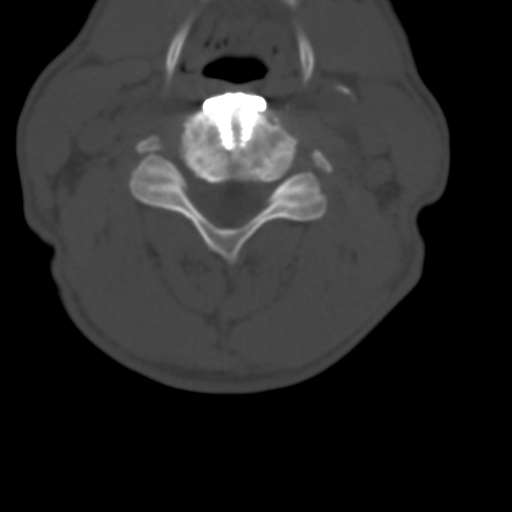
[im 69/104  bone]
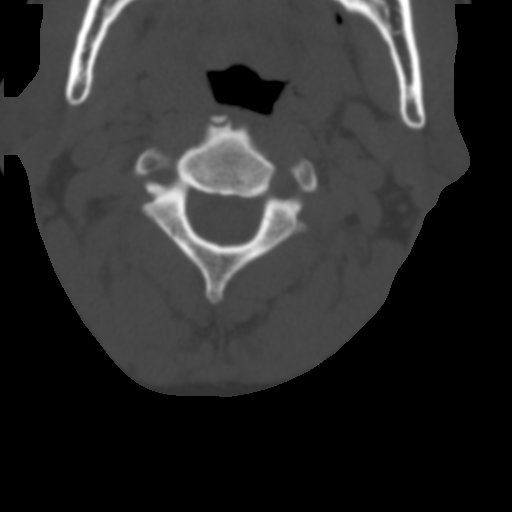
[im 86/104  soft-tissue]
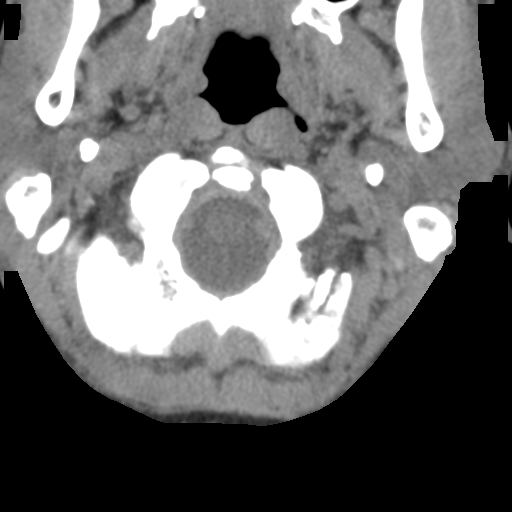
[im 86/104  bone]
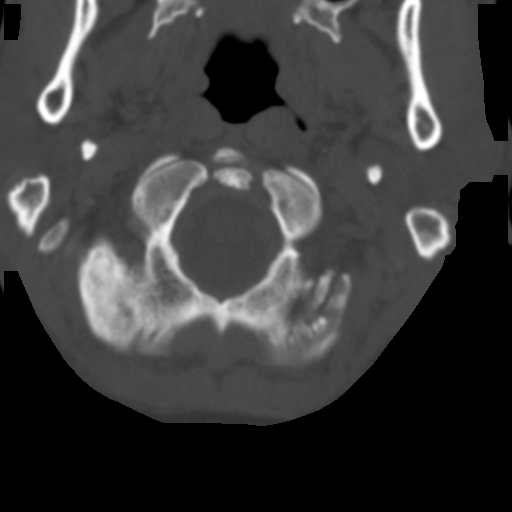

[10 of 14 positions shown; findings below may reference images not displayed]

FINDINGS: ALIGNMENT: Maintained lordosis. Vertebral bodies in alignment.

SKULL BASE AND VERTEBRAE: Cervical vertebral bodies and posterior
elements are intact. Status post C3 through C7 ACDF. C3-4, C4-5,
C5-6 arthrodesis. C6-7 non incorporated interbody fusion material.
Similar C3-4 disc height loss ventral endplate spurring. C1-2
articulation maintained with mild arthropathy and calcified apical
ligament. No destructive bony lesions.

SOFT TISSUES AND SPINAL CANAL: Nonacute. Mild calcific
atherosclerosis carotid bifurcations. Subcentimeter RIGHT thyroid
nodule, below size follow-up recommendation.

DISC LEVELS:

C2-3: Uncovertebral hypertrophy. No canal stenosis. Mild RIGHT
neural foraminal narrowing.

C3-4: ACDF. Uncovertebral hypertrophy. Mild canal stenosis. Severe
RIGHT, moderate LEFT neural foraminal narrowing.

C4-5, C5-6: ACDF: Uncovertebral hypertrophy. No canal stenosis.
Moderate to severe bilateral neural foraminal narrowing.

C6-7: ACDF. Uncovertebral hypertrophy. No canal stenosis. Severe
bilateral neural foraminal narrowing.

C7-T1: No disc bulge, canal stenosis nor neural foraminal narrowing.

UPPER CHEST: Centrilobular emphysema.

OTHER: None.
IMPRESSION: 1. No acute fracture or malalignment.
2. Status post C3 through C7 ACDF. C3-4 through C5-6 arthrodesis.
C6-7 pseudoarthrosis.
3. Mild canal stenosis C3-4.
4. Neural foraminal narrowing C2-3 through C6-7: Severe on the RIGHT
at C3-4 and severe bilaterally at C6-7.

Emphysema ([id number]).

## 2018-03-28 ENCOUNTER — Ambulatory Visit
Admission: RE | Admit: 2018-03-28 | Discharge: 2018-03-28 | Disposition: A | Discharge status: Home / Self Care | Payer: Medicaid Other | Source: Ambulatory Visit | Attending: Neurosurgery | Admitting: Neurosurgery

## 2018-03-28 DIAGNOSIS — R2232 Localized swelling, mass and lump, left upper limb: Secondary | ICD-10-CM

## 2018-03-28 MED ORDER — GADOBENATE DIMEGLUMINE 529 MG/ML IV SOLN
15.0000 mL | Freq: Once | INTRAVENOUS | Status: AC | PRN
  Administered 2018-03-28: 15 mL via INTRAVENOUS

## 2018-07-12 ENCOUNTER — Other Ambulatory Visit: Payer: Self-pay | Admitting: Neurosurgery

## 2018-07-12 DIAGNOSIS — M5412 Radiculopathy, cervical region: Secondary | ICD-10-CM

## 2018-07-22 ENCOUNTER — Ambulatory Visit
Admission: RE | Admit: 2018-07-22 | Discharge: 2018-07-22 | Disposition: A | Discharge status: Home / Self Care | Payer: Medicaid Other | Source: Ambulatory Visit | Attending: Neurosurgery | Admitting: Neurosurgery

## 2018-07-22 DIAGNOSIS — M5412 Radiculopathy, cervical region: Secondary | ICD-10-CM

## 2018-08-03 HISTORY — PX: BACK SURGERY: SHX140

## 2018-08-07 ENCOUNTER — Other Ambulatory Visit: Payer: Self-pay | Admitting: Neurosurgery

## 2018-08-07 DIAGNOSIS — M5412 Radiculopathy, cervical region: Secondary | ICD-10-CM

## 2018-08-19 ENCOUNTER — Other Ambulatory Visit: Payer: Self-pay | Admitting: Neurosurgery

## 2018-08-19 DIAGNOSIS — M5412 Radiculopathy, cervical region: Secondary | ICD-10-CM

## 2018-08-25 ENCOUNTER — Ambulatory Visit
Admission: RE | Admit: 2018-08-25 | Discharge: 2018-08-25 | Disposition: A | Discharge status: Home / Self Care | Payer: Medicaid Other | Source: Ambulatory Visit | Attending: Neurosurgery | Admitting: Neurosurgery

## 2018-08-25 DIAGNOSIS — M5412 Radiculopathy, cervical region: Secondary | ICD-10-CM

## 2018-08-25 MED ORDER — IOPAMIDOL (ISOVUE-M 300) INJECTION 61%
1.0000 mL | Freq: Once | INTRAMUSCULAR | Status: AC | PRN
  Administered 2018-08-25: 1 mL via EPIDURAL

## 2018-08-25 MED ORDER — TRIAMCINOLONE ACETONIDE 40 MG/ML IJ SUSP (RADIOLOGY)
60.0000 mg | Freq: Once | INTRAMUSCULAR | Status: AC
  Administered 2018-08-25: 60 mg via EPIDURAL

## 2018-08-25 NOTE — Discharge Instructions (Signed)

## 2018-12-03 HISTORY — PX: NECK SURGERY: SHX720

## 2019-01-13 ENCOUNTER — Other Ambulatory Visit: Payer: Self-pay | Admitting: Neurosurgery

## 2019-01-23 NOTE — Pre-Procedure Instructions (Signed)
Xavier Livingston  01/23/2019      [facility] - [place], [address] [place] [address] Phone: [phone] Fax: [phone]    Your procedure is scheduled on February 04, 2019.  Report to [facility] Entrance "A" at 630 AM.  Call this number if you have problems the morning of surgery:  [phone]   Remember:  Do not eat or drink after midnight.    Take these medicines the morning of surgery with A SIP OF WATER  Amlodipine (Norvasc) Oxycodone-acetaminophen (Percocet)-If needed  7 days prior to surgery STOP taking any Aspirin (unless otherwise instructed by your surgeon), Aleve, Naproxen, Ibuprofen, Motrin, Advil, Goody's, BC's, all herbal medications, fish oil, and all vitamins     Do not wear jewelry  Do not wear lotions, powders, or colgones, or deodorant.  Men may shave face and neck.  Do not bring valuables to the hospital.  [facility] is not responsible for any belongings or valuables.  Contacts, dentures or bridgework may not be worn into surgery.  Leave your suitcase in the car.  After surgery it may be brought to your room.  For patients admitted to the hospital, discharge time will be determined by your treatment team.  Patients discharged the day of surgery will not be allowed to drive home.    [facility]- Preparing For Surgery  Before surgery, you can play an important role. Because skin is not sterile, your skin needs to be as free of germs as possible. You can reduce the number of germs on your skin by washing with CHG (chlorahexidine gluconate) Soap before surgery.  CHG is an antiseptic cleaner which kills germs and bonds with the skin to continue killing germs even after washing.    Oral Hygiene is also important to reduce your risk of infection.  Remember - BRUSH YOUR TEETH THE MORNING OF SURGERY WITH YOUR REGULAR TOOTHPASTE  Please do not use if you have an allergy to CHG or antibacterial soaps. If your skin  becomes reddened/irritated stop using the CHG.  Do not shave (including legs and underarms) for at least 48 hours prior to first CHG shower. It is OK to shave your face.  Please follow these instructions carefully.   1. Shower the NIGHT BEFORE SURGERY and the MORNING OF SURGERY with CHG.   2. If you chose to wash your hair, wash your hair first as usual with your normal shampoo.  3. After you shampoo, rinse your hair and body thoroughly to remove the shampoo.  4. Use CHG as you would any other liquid soap. You can apply CHG directly to the skin and wash gently with a scrungie or a clean washcloth.   5. Apply the CHG Soap to your body ONLY FROM THE NECK DOWN.  Do not use on open wounds or open sores. Avoid contact with your eyes, ears, mouth and genitals (private parts). Wash Face and genitals (private parts)  with your normal soap.  6. Wash thoroughly, paying special attention to the area where your surgery will be performed.  7. Thoroughly rinse your body with warm water from the neck down.  8. DO NOT shower/wash with your normal soap after using and rinsing off the CHG Soap.  9. Pat yourself dry with a CLEAN TOWEL.  10. Wear CLEAN PAJAMAS to bed the night before surgery, wear comfortable clothes the morning of surgery  11. Place CLEAN SHEETS on your bed the night of  your first shower and DO NOT SLEEP WITH PETS.  Day of Surgery:  Do not apply any deodorants/lotions.  Please wear clean clothes to the hospital/surgery center.   Remember to brush your teeth WITH YOUR REGULAR TOOTHPASTE.  Please read over the following fact sheets that you were given.

## 2019-01-26 ENCOUNTER — Encounter (HOSPITAL_COMMUNITY)
Admission: RE | Admit: 2019-01-26 | Discharge: 2019-01-26 | Disposition: A | Discharge status: Home / Self Care | Payer: Medicaid Other | Source: Ambulatory Visit | Attending: Neurosurgery | Admitting: Neurosurgery

## 2019-01-26 ENCOUNTER — Encounter (HOSPITAL_COMMUNITY): Payer: Self-pay

## 2019-01-26 DIAGNOSIS — Z79899 Other long term (current) drug therapy: Secondary | ICD-10-CM | POA: Diagnosis not present

## 2019-01-26 DIAGNOSIS — Z01812 Encounter for preprocedural laboratory examination: Secondary | ICD-10-CM | POA: Insufficient documentation

## 2019-01-26 DIAGNOSIS — Z791 Long term (current) use of non-steroidal anti-inflammatories (NSAID): Secondary | ICD-10-CM | POA: Diagnosis not present

## 2019-01-26 DIAGNOSIS — E785 Hyperlipidemia, unspecified: Secondary | ICD-10-CM | POA: Insufficient documentation

## 2019-01-26 DIAGNOSIS — K219 Gastro-esophageal reflux disease without esophagitis: Secondary | ICD-10-CM | POA: Diagnosis not present

## 2019-01-26 DIAGNOSIS — N401 Enlarged prostate with lower urinary tract symptoms: Secondary | ICD-10-CM | POA: Diagnosis not present

## 2019-01-26 DIAGNOSIS — I10 Essential (primary) hypertension: Secondary | ICD-10-CM | POA: Diagnosis not present

## 2019-01-26 HISTORY — DX: Other complications of anesthesia, initial encounter: T88.59XA

## 2019-01-26 HISTORY — DX: Anxiety disorder, unspecified: F41.9

## 2019-01-26 HISTORY — DX: Gastro-esophageal reflux disease with esophagitis, without bleeding: K21.00

## 2019-01-26 HISTORY — DX: Adverse effect of unspecified anesthetic, initial encounter: T41.45XA

## 2019-01-26 HISTORY — DX: Gastro-esophageal reflux disease with esophagitis: K21.0

## 2019-01-26 LAB — BASIC METABOLIC PANEL
Anion gap: 8 (ref 5–15)
BUN: 9 mg/dL (ref 8–23)
CO2: 25 mmol/L (ref 22–32)
Calcium: 9.2 mg/dL (ref 8.9–10.3)
Chloride: 105 mmol/L (ref 98–111)
Creatinine, Ser: 1.03 mg/dL (ref 0.61–1.24)
GFR calc Af Amer: >60 mL/min (ref >60)
Glucose, Bld: 97 mg/dL (ref 70–99)
Potassium: 3.9 mmol/L (ref 3.5–5.1)
Sodium: 138 mmol/L (ref 135–145)

## 2019-01-26 LAB — CBC
HEMATOCRIT: 51.3 % (ref 39.0–52.0)
HEMOGLOBIN: 16.4 g/dL (ref 13.0–17.0)
MCH: 30.2 pg (ref 26.0–34.0)
MCHC: 32 g/dL (ref 30.0–36.0)
MCV: 94.5 fL (ref 80.0–100.0)
NRBC: 0 % (ref 0.0–0.2)
Platelets: 327 10*3/uL (ref 150–400)
RBC: 5.43 MIL/uL (ref 4.22–5.81)
RDW: 13.8 % (ref 11.5–15.5)
WBC: 6.6 10*3/uL (ref 4.0–10.5)

## 2019-01-26 LAB — TYPE AND SCREEN
ABO/RH(D): O POS
ANTIBODY SCREEN: NEGATIVE

## 2019-01-26 LAB — SURGICAL PCR SCREEN
MRSA, PCR: NEGATIVE
Staphylococcus aureus: NEGATIVE

## 2019-01-26 MED ORDER — CHLORHEXIDINE GLUCONATE CLOTH 2 % EX PADS: 6.0000 | MEDICATED_PAD | Freq: Once | CUTANEOUS | Status: DC

## 2019-01-26 NOTE — Anesthesia Preprocedure Evaluation (Addendum)
Anesthesia Evaluation  Patient identified by MRN, date of birth, ID band Patient awake    Reviewed: Allergy & Precautions, H&P , NPO status , Patient's Chart, lab work & pertinent test results  Airway Mallampati: I  TM Distance: >3 FB Neck ROM: Full    Dental no notable dental hx. (+) Edentulous Upper, Edentulous Lower, Dental Advisory Given   Pulmonary neg pulmonary ROS, former smoker,    Pulmonary exam normal breath sounds clear to auscultation       Cardiovascular hypertension, Pt. on medications  Rhythm:Regular Rate:Normal     Neuro/Psych  Headaches, Anxiety    GI/Hepatic Neg liver ROS, GERD  Medicated and Controlled,  Endo/Other  negative endocrine ROS  Renal/GU negative Renal ROS  negative genitourinary   Musculoskeletal  (+) Arthritis , Osteoarthritis,    Abdominal   Peds  Hematology negative hematology ROS (+)   Anesthesia Other Findings   Reproductive/Obstetrics negative OB ROS                           Anesthesia Physical Anesthesia Plan  ASA: II  Anesthesia Plan: General   Post-op Pain Management:    Induction: Intravenous  PONV Risk Score and Plan: 3 and Ondansetron, Dexamethasone and Midazolam  Airway Management Planned: Oral ETT  Additional Equipment:   Intra-op Plan:   Post-operative Plan: Extubation in OR  Informed Consent: I have reviewed the patients History and Physical, chart, labs and discussed the procedure including the risks, benefits and alternatives for the proposed anesthesia with the patient or authorized representative who has indicated his/her understanding and acceptance.     Dental advisory given  Plan Discussed with: CRNA  Anesthesia Plan Comments: (See PAT note written 01/26/2019 by Myra Gianotti, PA-C. Patient has chronic issues with dysphagia. )       Anesthesia Quick Evaluation

## 2019-01-26 NOTE — Progress Notes (Signed)
Anesthesia Chart Review:  Case:  [id number] Date/Time:  02/04/19 0815   Procedure:  Posterior Cervical Fusion with lateral mass fixation redo C6-T1 (N/A )   Anesthesia type:  General   Pre-op diagnosis:  Pseudoarthrosis   Location:  [facility] ROOM 19 / [facility]   Surgeon:  Kary Kos, MD      DISCUSSION: Patient is a 63 year old male scheduled for the above procedure.  History includes smoking, HTN, hyperlipidemia, dysrhythmia (irregular heartbeat in the 80's, no known afib), GERD, dyspnea, dysphagia, esophageal dilatation 05/16/17, headaches, BPH, seizure (describes more of a syncopal episode ~ age ~ 63 years old, unknown etiology, no recurrence), arthritis, nasal septum surgery (> 20 years ago), C3-7 ACDF 04/10/15, C6-7 posterior fusion 02/05/18.  Denied known anesthesia complication. He did have a near syncopal episode on morning of POD1 with  after his 02/05/18 surgery. Total CK and CK-MB elevated, but with normal relative index of 0.9. K 3.5, Cr 1.10. No anemia. He was discharged home on POD2. He had had a preoperative echo per his PCP Dr. Cindie Laroche prior to that surgery that showed normal LVEF and wall motion.     He reports persistent issues with dysphagia since his nasal septum surgery ~ 18-20 years ago. He has had esophageal dilatation ~ 4x over the years, last 05/16/17 with only short lived improvement in his symptoms ("4 to 5 hours"). He feels like once food is swallowed that it gets stuck in his upper esophagus (just below sternal notch). He often has to belch to try to help the food go down. Thin liquids are difficult, but also issues with softer foods like ice cream. He has to make sure foods are cut up in small pieces. He is able to swallow one pill at a time. He doesn't notice coughing with swallowing, just that food tends to feel stuck. Some days are not as bad as other and may even have one good day, but typically has symptoms at least 6 days out of the week. He denied known CVA or symptoms such as  unilateral weakness or visual changes. He did not notice any worsening in his symptoms after his neck surgeries. He has not seen an ENT recently and hasn't gone back to GI because esophageal dilation has not proved to be very effective long-term for him. He has not noticed a change in the quality of his voice, although the quality of his voice seems a little reduced that would be expected.    His neck movement is somewhat limited since his neck surgeries, particularly with turning neck to the left. He has chronic soreness along his left neck down to mid bicep region for several months and cannot raise his arm above his shoulder. He does not have any exertional chest pains. He walks 5 miles twice a day without chest pain. He may have to rest for ~ 3 minutes every 15 minutes for dyspnea, but says there has been no progressive symptoms or change in his functional status in over a year.   Reviewed with anesthesiologist Renold Don, MD. Patient with chronic issues with dysphagia for nearly 20 years as outlined above. Short lived improvement after prior esophageal dilatations. He does not notice coughing with swallowing, more feeling of food getting temporarily stuck. Since chronic issue, it is anticipated that he could undergo surgery from an anesthesia standpoint, but would defer decision on whether to do rapid sequence induction to assigned anesthesiologist. Also notified Lorriane Shire at Dr. Windy Carina office since he  may prefer that patient have this further addressed prior to performing another neck surgery. Depending on his length of stay, could consider swallow evaluation while hospitalized, but would defer to surgeon.   VS: BP (!) 163/85 Comment: pt states that he has just taken his bp meds  Pulse 71   Temp 36.8 C   Resp 20   Ht 5\' 11"  (1.803 m)   Wt 77.3 kg   SpO2 96%   BMI 23.78 kg/m  Heart RRR, no murmur noted. Lungs clear. Mallampati II.   PROVIDERS: - PCP is Dr. Lucia Gaskins who is an  internist in [place], but also completed a Cardiology fellowship. - GI is Dr. Hildred Laser.    LABS: Labs reviewed: Acceptable for surgery. 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 - Loosening of BILATERAL C6 and C7 screws is accompanied by extension of the BILATERAL C7 screws into the facet joint at C7-T1. - Osseous foraminal narrowing due to uncinate spurring at multiple levels, see discussion above. LEFT-sided foraminal narrowing is most pronounced at C4-5 and C6-7.  EGD 05/16/17: FINDINGS:  1. The examined esophagus was normal. 2. 2 cm hiatal hernia.  3. Irregular Z-line found 38 cm from the incisors.  - No endoscopic abnormality to explain patient's complaint of dysphagia.  Decision to proceed with dilation of the entire esophagus.  CXR 02/26/17: FINDINGS: The heart size and mediastinal contours are within normal limits. Both lungs are clear. The visualized skeletal structures are unremarkable. IMPRESSION: No active cardiopulmonary disease.  DG Esophagus 09/18/16: IMPRESSION: Moderate smooth distal esophageal stricture. A 13 mm barium tablet successfully, although uncomfortably, traversed this narrowing.   EKG:  EKG 02/06/18: NSR. U waves present. Findings of possible septal infarct are no longer present when compared to 05/13/17 tracing.    CV:  Echo 01/27/18 (Dr. Cindie Laroche; scanned under Media tab, Correspondence 02/05/18): Left atrium appears normal.  Aortic root appears normal.  Left ventricular chamber dimensions appear normal.  Left ventricular wall thickness appeared normal.  Left ventricular systolic function appears grossly normal. Paradoxical septal movement is not visible. EF 55%.  EKG 05/13/17: Sinus rhythm, LVH, cannot rule out  septal infarct (age undermined). Septal Q waves (QR pattern) are new when compared to 04/07/15 and 10/23/13 tracings.   Patient reported seeing a cardiologist ~ 20 years ago with a stress test at that time. He denied prior echo or cardiac cath.   Past Medical History:  Diagnosis Date  . Anxiety   . Arthritis   . Dyspnea   . Dysrhythmia    irregular heartbeat in the 80's  . Enlarged prostate   . GERD (gastroesophageal reflux disease)   . Headache   . Hyperlipemia   . Hypertension   . Nocturia more than twice per night   . Seizures ([place])    last at age 62 or 63 years old  . Smoker within last 12 months   . Swallowing difficulty     Past Surgical History:  Procedure Laterality Date  . ANTERIOR CERVICAL DECOMPRESSION/DISCECTOMY FUSION 4 LEVELS N/A 04/11/2015   Procedure: Anterior Cervical Discectomy/ Decompression Fusion Cervical three-four, Cervical four-five, Cervical five-six, Cervical six- seven;  Surgeon: Kary Kos, MD;  Location: [facility];  Service: Neurosurgery;  Laterality: N/A;  . BACK SURGERY    .  COLONOSCOPY N/A 08/18/2014   Procedure: COLONOSCOPY;  Surgeon: Rogene Houston, MD;  [facility];  Service: Endoscopy;  Laterality: N/A;  1030-moved to 1200 Ann to notify pt  . ESOPHAGEAL DILATION N/A 11/29/2016   Procedure: ESOPHAGEAL DILATION;  Surgeon: Rogene Houston, MD;  [facility];  Service: Endoscopy;  Laterality: N/A;  . ESOPHAGEAL DILATION N/A 05/16/2017   Procedure: ESOPHAGEAL DILATION;  Surgeon: Rogene Houston, MD;  [facility];  Service: Endoscopy;  Laterality: N/A;  . ESOPHAGOGASTRODUODENOSCOPY N/A 08/18/2014   Procedure: ESOPHAGOGASTRODUODENOSCOPY (EGD);  Surgeon: Rogene Houston, MD;  [facility];  Service: Endoscopy;  Laterality: N/A;  . ESOPHAGOGASTRODUODENOSCOPY N/A 11/29/2016   Procedure: ESOPHAGOGASTRODUODENOSCOPY (EGD);  Surgeon: Rogene Houston, MD;  [facility];  Service: Endoscopy;  Laterality:  N/A;  12:00  . ESOPHAGOGASTRODUODENOSCOPY (EGD) WITH PROPOFOL N/A 05/16/2017   Procedure: ESOPHAGOGASTRODUODENOSCOPY (EGD) WITH PROPOFOL;  Surgeon: Rogene Houston, MD;  [facility];  Service: Endoscopy;  Laterality: N/A;  8:30  . excision of mass of back    . MALONEY DILATION  08/18/2014   Procedure: MALONEY DILATION;  Surgeon: Rogene Houston, MD;  [facility];  Service: Endoscopy;;  . MASS EXCISION N/A 10/21/2013   Procedure: EXCISION MASSES X 2 ON BACK;  Surgeon: Scherry Ran, MD;  Location: [facility];  Service: General;  Laterality: N/A;  . NASAL SEPTUM SURGERY    . POSTERIOR CERVICAL FUSION/FORAMINOTOMY N/A 02/05/2018   Procedure: Posterior Cervical Fusion with lateral mass fixation - Cervical six-seven;  Surgeon: Kary Kos, MD;  Location: [facility];  Service: Neurosurgery;  Laterality: N/A;    MEDICATIONS: . amLODipine (NORVASC) 5 MG tablet  . cyclobenzaprine (FLEXERIL) 10 MG tablet  . naproxen (NAPROSYN) 500 MG tablet  . oxyCODONE 10 MG TABS  . oxyCODONE-acetaminophen (PERCOCET/ROXICET) 5-325 MG tablet  . pantoprazole (PROTONIX) 40 MG tablet  . pravastatin (PRAVACHOL) 40 MG tablet   No current facility-administered medications for this encounter.     Myra Gianotti, PA-C Surgical Short Stay/Anesthesiology [facility] Phone [phone] [facility] Phone [phone] 01/26/2019 4:09 PM

## 2019-01-26 NOTE — Progress Notes (Signed)
PCP - Dr Cindie Laroche in [place] Cardiologist - none  Chest x-ray - 3/18 EKG - 3/19 S E  Sleep Study - none CPAP -   Fasting Blood Sugar - na Checks Blood Sugar _____ times a day  Blood Thinner Instructions:na Aspirin Instructions:na  Anesthesia review: consult with Ebony Hail PA regarding  Reflux and diff with swallowing   Patient denies shortness of breath, fever, cough and chest pain at PAT appointment   Patient verbalized understanding of instructions that were given to them at the PAT appointment. Patient was also instructed that they will need to review over the PAT instructions again at home before surgery.

## 2019-02-06 ENCOUNTER — Encounter (HOSPITAL_COMMUNITY): Payer: Self-pay | Admitting: *Deleted

## 2019-02-06 ENCOUNTER — Inpatient Hospital Stay (HOSPITAL_COMMUNITY): Payer: Medicaid Other | Admitting: Vascular Surgery

## 2019-02-06 ENCOUNTER — Encounter (HOSPITAL_COMMUNITY)
Admission: RE | Disposition: A | Discharge status: Home / Self Care | Payer: Self-pay | Source: Home / Self Care | Attending: Neurosurgery

## 2019-02-06 ENCOUNTER — Inpatient Hospital Stay (HOSPITAL_COMMUNITY)
Admission: RE | Admit: 2019-02-06 | Discharge: 2019-02-07 | DRG: 473 | Disposition: A | Discharge status: Home / Self Care | Payer: Medicaid Other | Attending: Neurosurgery | Admitting: Neurosurgery

## 2019-02-06 ENCOUNTER — Other Ambulatory Visit: Payer: Self-pay

## 2019-02-06 ENCOUNTER — Inpatient Hospital Stay (HOSPITAL_COMMUNITY): Payer: Medicaid Other | Admitting: Certified Registered Nurse Anesthetist

## 2019-02-06 DIAGNOSIS — M1990 Unspecified osteoarthritis, unspecified site: Secondary | ICD-10-CM | POA: Diagnosis not present

## 2019-02-06 DIAGNOSIS — E785 Hyperlipidemia, unspecified: Secondary | ICD-10-CM | POA: Diagnosis present

## 2019-02-06 DIAGNOSIS — Z87891 Personal history of nicotine dependence: Secondary | ICD-10-CM

## 2019-02-06 DIAGNOSIS — Z791 Long term (current) use of non-steroidal anti-inflammatories (NSAID): Secondary | ICD-10-CM

## 2019-02-06 DIAGNOSIS — Z981 Arthrodesis status: Secondary | ICD-10-CM

## 2019-02-06 DIAGNOSIS — T84226A Displacement of internal fixation device of vertebrae, initial encounter: Secondary | ICD-10-CM | POA: Diagnosis not present

## 2019-02-06 DIAGNOSIS — Z79899 Other long term (current) drug therapy: Secondary | ICD-10-CM | POA: Diagnosis not present

## 2019-02-06 DIAGNOSIS — R51 Headache: Secondary | ICD-10-CM | POA: Diagnosis present

## 2019-02-06 DIAGNOSIS — Y838 Other surgical procedures as the cause of abnormal reaction of the patient, or of later complication, without mention of misadventure at the time of the procedure: Secondary | ICD-10-CM | POA: Diagnosis present

## 2019-02-06 DIAGNOSIS — M5412 Radiculopathy, cervical region: Secondary | ICD-10-CM | POA: Diagnosis not present

## 2019-02-06 DIAGNOSIS — F419 Anxiety disorder, unspecified: Secondary | ICD-10-CM | POA: Diagnosis present

## 2019-02-06 DIAGNOSIS — I10 Essential (primary) hypertension: Secondary | ICD-10-CM | POA: Diagnosis present

## 2019-02-06 DIAGNOSIS — K219 Gastro-esophageal reflux disease without esophagitis: Secondary | ICD-10-CM | POA: Diagnosis present

## 2019-02-06 HISTORY — PX: POSTERIOR CERVICAL FUSION/FORAMINOTOMY: SHX5038

## 2019-02-06 SURGERY — POSTERIOR CERVICAL FUSION/FORAMINOTOMY LEVEL 2
Anesthesia: General

## 2019-02-06 MED ORDER — HYDROMORPHONE HCL 1 MG/ML IJ SOLN
0.2500 mg | INTRAMUSCULAR | Status: DC | PRN
  Administered 2019-02-06 (×2): 0.25 mg via INTRAVENOUS

## 2019-02-06 MED ORDER — ONDANSETRON HCL 4 MG/2ML IJ SOLN: 4.0000 mg | Freq: Four times a day (QID) | INTRAMUSCULAR | Status: DC | PRN

## 2019-02-06 MED ORDER — THROMBIN 5000 UNITS EX SOLR
OROMUCOSAL | Status: DC | PRN
  Administered 2019-02-06: 15:00:00 via TOPICAL

## 2019-02-06 MED ORDER — PRAVASTATIN SODIUM 40 MG PO TABS
40.0000 mg | ORAL_TABLET | Freq: Every day | ORAL | Status: DC
  Administered 2019-02-06: 40 mg via ORAL
  Filled 2019-02-06: qty 1

## 2019-02-06 MED ORDER — FENTANYL CITRATE (PF) 100 MCG/2ML IJ SOLN: 50.0000 ug | Freq: Once | INTRAMUSCULAR | Status: DC

## 2019-02-06 MED ORDER — CEFAZOLIN SODIUM-DEXTROSE 2-4 GM/100ML-% IV SOLN
2.0000 g | Freq: Three times a day (TID) | INTRAVENOUS | Status: AC
  Administered 2019-02-06 – 2019-02-07 (×2): 2 g via INTRAVENOUS
  Filled 2019-02-06 (×2): qty 100

## 2019-02-06 MED ORDER — BACITRACIN ZINC 500 UNIT/GM EX OINT
TOPICAL_OINTMENT | CUTANEOUS | Status: DC | PRN
  Administered 2019-02-06: 1 via TOPICAL

## 2019-02-06 MED ORDER — LIDOCAINE-EPINEPHRINE 1 %-1:100000 IJ SOLN
INTRAMUSCULAR | Status: AC
  Filled 2019-02-06: qty 1

## 2019-02-06 MED ORDER — OXYCODONE-ACETAMINOPHEN 5-325 MG PO TABS
1.0000 | ORAL_TABLET | Freq: Four times a day (QID) | ORAL | Status: DC
  Administered 2019-02-06 – 2019-02-07 (×3): 1 via ORAL
  Filled 2019-02-06 (×3): qty 1

## 2019-02-06 MED ORDER — SODIUM CHLORIDE 0.9% FLUSH: 3.0000 mL | Freq: Two times a day (BID) | INTRAVENOUS | Status: DC

## 2019-02-06 MED ORDER — SODIUM CHLORIDE 0.9 % IV SOLN
INTRAVENOUS | Status: DC | PRN
  Administered 2019-02-06: 15:00:00

## 2019-02-06 MED ORDER — CEFAZOLIN SODIUM-DEXTROSE 2-4 GM/100ML-% IV SOLN
2.0000 g | INTRAVENOUS | Status: AC
  Administered 2019-02-06: 2 g via INTRAVENOUS

## 2019-02-06 MED ORDER — CYCLOBENZAPRINE HCL 10 MG PO TABS
ORAL_TABLET | ORAL | Status: AC
  Filled 2019-02-06: qty 1

## 2019-02-06 MED ORDER — DEXAMETHASONE SODIUM PHOSPHATE 10 MG/ML IJ SOLN
INTRAMUSCULAR | Status: AC
  Filled 2019-02-06: qty 1

## 2019-02-06 MED ORDER — FENTANYL CITRATE (PF) 100 MCG/2ML IJ SOLN
INTRAMUSCULAR | Status: DC | PRN
  Administered 2019-02-06: 100 ug via INTRAVENOUS
  Administered 2019-02-06 (×3): 50 ug via INTRAVENOUS

## 2019-02-06 MED ORDER — MENTHOL 3 MG MT LOZG: 1.0000 | LOZENGE | OROMUCOSAL | Status: DC | PRN

## 2019-02-06 MED ORDER — HYDROMORPHONE HCL 1 MG/ML IJ SOLN: 1.0000 mg | INTRAMUSCULAR | Status: DC | PRN

## 2019-02-06 MED ORDER — PHENOL 1.4 % MT LIQD: 1.0000 | OROMUCOSAL | Status: DC | PRN

## 2019-02-06 MED ORDER — ONDANSETRON HCL 4 MG/2ML IJ SOLN
INTRAMUSCULAR | Status: AC
  Filled 2019-02-06: qty 2

## 2019-02-06 MED ORDER — PANTOPRAZOLE SODIUM 40 MG PO TBEC
40.0000 mg | DELAYED_RELEASE_TABLET | Freq: Every day | ORAL | Status: DC
  Administered 2019-02-06 – 2019-02-07 (×2): 40 mg via ORAL
  Filled 2019-02-06 (×2): qty 1

## 2019-02-06 MED ORDER — ACETAMINOPHEN 325 MG PO TABS: 650.0000 mg | ORAL_TABLET | ORAL | Status: DC | PRN

## 2019-02-06 MED ORDER — OXYCODONE HCL 5 MG PO TABS
ORAL_TABLET | ORAL | Status: AC
  Filled 2019-02-06: qty 2

## 2019-02-06 MED ORDER — BUPIVACAINE HCL (PF) 0.25 % IJ SOLN
INTRAMUSCULAR | Status: DC | PRN
  Administered 2019-02-06: 10 mL

## 2019-02-06 MED ORDER — ACETAMINOPHEN 650 MG RE SUPP: 650.0000 mg | RECTAL | Status: DC | PRN

## 2019-02-06 MED ORDER — ROCURONIUM BROMIDE 50 MG/5ML IV SOSY
PREFILLED_SYRINGE | INTRAVENOUS | Status: DC | PRN
  Administered 2019-02-06: 20 mg via INTRAVENOUS
  Administered 2019-02-06: 80 mg via INTRAVENOUS

## 2019-02-06 MED ORDER — GLYCOPYRROLATE PF 0.2 MG/ML IJ SOSY
PREFILLED_SYRINGE | INTRAMUSCULAR | Status: AC
  Filled 2019-02-06: qty 1

## 2019-02-06 MED ORDER — PHENYLEPHRINE 40 MCG/ML (10ML) SYRINGE FOR IV PUSH (FOR BLOOD PRESSURE SUPPORT)
PREFILLED_SYRINGE | INTRAVENOUS | Status: AC
  Filled 2019-02-06: qty 10

## 2019-02-06 MED ORDER — NAPROXEN 250 MG PO TABS
500.0000 mg | ORAL_TABLET | Freq: Two times a day (BID) | ORAL | Status: DC
  Administered 2019-02-06 – 2019-02-07 (×2): 500 mg via ORAL
  Filled 2019-02-06 (×2): qty 2

## 2019-02-06 MED ORDER — ONDANSETRON HCL 4 MG PO TABS: 4.0000 mg | ORAL_TABLET | Freq: Four times a day (QID) | ORAL | Status: DC | PRN

## 2019-02-06 MED ORDER — AMLODIPINE BESYLATE 5 MG PO TABS
5.0000 mg | ORAL_TABLET | Freq: Every day | ORAL | Status: DC
  Administered 2019-02-07: 5 mg via ORAL
  Filled 2019-02-06: qty 1

## 2019-02-06 MED ORDER — SODIUM CHLORIDE 0.9% FLUSH: 3.0000 mL | INTRAVENOUS | Status: DC | PRN

## 2019-02-06 MED ORDER — PROPOFOL 10 MG/ML IV BOLUS
INTRAVENOUS | Status: DC | PRN
  Administered 2019-02-06: 200 mg via INTRAVENOUS

## 2019-02-06 MED ORDER — MIDAZOLAM HCL 5 MG/5ML IJ SOLN
INTRAMUSCULAR | Status: DC | PRN
  Administered 2019-02-06: 2 mg via INTRAVENOUS

## 2019-02-06 MED ORDER — SODIUM CHLORIDE 0.9 % IV SOLN: 250.0000 mL | INTRAVENOUS | Status: DC

## 2019-02-06 MED ORDER — THROMBIN 20000 UNITS EX SOLR
CUTANEOUS | Status: AC
  Filled 2019-02-06: qty 20000

## 2019-02-06 MED ORDER — SODIUM CHLORIDE 0.9 % IV SOLN
INTRAVENOUS | Status: DC | PRN
  Administered 2019-02-06: 10 ug/min via INTRAVENOUS
  Administered 2019-02-06: 20 ug/min via INTRAVENOUS

## 2019-02-06 MED ORDER — CYCLOBENZAPRINE HCL 10 MG PO TABS
10.0000 mg | ORAL_TABLET | Freq: Three times a day (TID) | ORAL | Status: DC | PRN
  Administered 2019-02-06 (×2): 10 mg via ORAL
  Filled 2019-02-06: qty 1

## 2019-02-06 MED ORDER — GLYCOPYRROLATE 0.2 MG/ML IJ SOLN
INTRAMUSCULAR | Status: DC | PRN
  Administered 2019-02-06: 0.2 mg via INTRAVENOUS

## 2019-02-06 MED ORDER — ACETAMINOPHEN 500 MG PO TABS
ORAL_TABLET | ORAL | Status: AC
  Filled 2019-02-06: qty 2

## 2019-02-06 MED ORDER — DEXAMETHASONE SODIUM PHOSPHATE 10 MG/ML IJ SOLN
10.0000 mg | INTRAMUSCULAR | Status: AC
  Administered 2019-02-06: 10 mg via INTRAVENOUS

## 2019-02-06 MED ORDER — CEFAZOLIN SODIUM-DEXTROSE 2-4 GM/100ML-% IV SOLN
INTRAVENOUS | Status: AC
  Filled 2019-02-06: qty 100

## 2019-02-06 MED ORDER — PANTOPRAZOLE SODIUM 40 MG IV SOLR: 40.0000 mg | Freq: Every day | INTRAVENOUS | Status: DC

## 2019-02-06 MED ORDER — THROMBIN 20000 UNITS EX SOLR
CUTANEOUS | Status: DC | PRN
  Administered 2019-02-06: 15:00:00 via TOPICAL

## 2019-02-06 MED ORDER — PHENYLEPHRINE HCL 10 MG/ML IJ SOLN
INTRAMUSCULAR | Status: DC | PRN
  Administered 2019-02-06: 80 ug via INTRAVENOUS

## 2019-02-06 MED ORDER — BACITRACIN ZINC 500 UNIT/GM EX OINT
TOPICAL_OINTMENT | CUTANEOUS | Status: AC
  Filled 2019-02-06: qty 28.35

## 2019-02-06 MED ORDER — MIDAZOLAM HCL 2 MG/2ML IJ SOLN: 2.0000 mg | Freq: Once | INTRAMUSCULAR | Status: DC

## 2019-02-06 MED ORDER — OXYCODONE HCL 5 MG PO TABS
10.0000 mg | ORAL_TABLET | ORAL | Status: DC | PRN
  Administered 2019-02-06 – 2019-02-07 (×3): 10 mg via ORAL
  Filled 2019-02-06 (×2): qty 2

## 2019-02-06 MED ORDER — LIDOCAINE 2% (20 MG/ML) 5 ML SYRINGE
INTRAMUSCULAR | Status: DC | PRN
  Administered 2019-02-06: 60 mg via INTRAVENOUS

## 2019-02-06 MED ORDER — ALUM & MAG HYDROXIDE-SIMETH 200-200-20 MG/5ML PO SUSP: 30.0000 mL | Freq: Four times a day (QID) | ORAL | Status: DC | PRN

## 2019-02-06 MED ORDER — LACTATED RINGERS IV SOLN
INTRAVENOUS | Status: DC
  Administered 2019-02-06: 13:00:00 via INTRAVENOUS

## 2019-02-06 MED ORDER — PROPOFOL 10 MG/ML IV BOLUS
INTRAVENOUS | Status: AC
  Filled 2019-02-06: qty 20

## 2019-02-06 MED ORDER — LIDOCAINE-EPINEPHRINE 1 %-1:100000 IJ SOLN
INTRAMUSCULAR | Status: DC | PRN
  Administered 2019-02-06: 10 mL

## 2019-02-06 MED ORDER — THROMBIN 5000 UNITS EX SOLR
CUTANEOUS | Status: AC
  Filled 2019-02-06: qty 5000

## 2019-02-06 MED ORDER — BUPIVACAINE HCL (PF) 0.25 % IJ SOLN
INTRAMUSCULAR | Status: AC
  Filled 2019-02-06: qty 30

## 2019-02-06 MED ORDER — LACTATED RINGERS IV SOLN
INTRAVENOUS | Status: DC | PRN
  Administered 2019-02-06: 14:00:00 via INTRAVENOUS

## 2019-02-06 MED ORDER — MIDAZOLAM HCL 2 MG/2ML IJ SOLN
INTRAMUSCULAR | Status: AC
  Filled 2019-02-06: qty 2

## 2019-02-06 MED ORDER — FENTANYL CITRATE (PF) 250 MCG/5ML IJ SOLN
INTRAMUSCULAR | Status: AC
  Filled 2019-02-06: qty 5

## 2019-02-06 MED ORDER — ACETAMINOPHEN 500 MG PO TABS
1000.0000 mg | ORAL_TABLET | Freq: Once | ORAL | Status: AC
  Administered 2019-02-06: 1000 mg via ORAL

## 2019-02-06 MED ORDER — HYDROMORPHONE HCL 1 MG/ML IJ SOLN
INTRAMUSCULAR | Status: AC
  Administered 2019-02-06: 0.25 mg via INTRAVENOUS
  Filled 2019-02-06: qty 1

## 2019-02-06 SURGICAL SUPPLY — 59 items
BAG DECANTER FOR FLEXI CONT (MISCELLANEOUS) ×2 IMPLANT
BENZOIN TINCTURE PRP APPL 2/3 (GAUZE/BANDAGES/DRESSINGS) ×2 IMPLANT
BLADE CLIPPER SURG (BLADE) IMPLANT
BLADE SURG 11 STRL SS (BLADE) IMPLANT
BONE VIVIGEN FORMABLE 5.4CC (Bone Implant) ×2 IMPLANT
BUR MATCHSTICK NEURO 3.0 LAGG (BURR) ×2 IMPLANT
CANISTER SUCT 3000ML PPV (MISCELLANEOUS) ×2 IMPLANT
CARTRIDGE OIL MAESTRO DRILL (MISCELLANEOUS) ×1 IMPLANT
COVER WAND RF STERILE (DRAPES) ×2 IMPLANT
DECANTER SPIKE VIAL GLASS SM (MISCELLANEOUS) ×2 IMPLANT
DERMABOND ADVANCED (GAUZE/BANDAGES/DRESSINGS) ×1
DERMABOND ADVANCED .7 DNX12 (GAUZE/BANDAGES/DRESSINGS) ×1 IMPLANT
DIFFUSER DRILL AIR PNEUMATIC (MISCELLANEOUS) ×2 IMPLANT
DRAPE C-ARM 42X72 X-RAY (DRAPES) IMPLANT
DRAPE LAPAROTOMY 100X72 PEDS (DRAPES) ×2 IMPLANT
DRAPE MICROSCOPE LEICA (MISCELLANEOUS) IMPLANT
DRAPE SURG 17X23 STRL (DRAPES) IMPLANT
DRSG OPSITE POSTOP 4X6 (GAUZE/BANDAGES/DRESSINGS) ×2 IMPLANT
DURAPREP 26ML APPLICATOR (WOUND CARE) ×2 IMPLANT
ELECT REM PT RETURN 9FT ADLT (ELECTROSURGICAL) ×2
ELECTRODE REM PT RTRN 9FT ADLT (ELECTROSURGICAL) ×1 IMPLANT
GAUZE 4X4 16PLY RFD (DISPOSABLE) IMPLANT
GAUZE SPONGE 4X4 12PLY STRL (GAUZE/BANDAGES/DRESSINGS) ×2 IMPLANT
GLOVE BIO SURGEON STRL SZ7 (GLOVE) IMPLANT
GLOVE BIO SURGEON STRL SZ8 (GLOVE) ×2 IMPLANT
GLOVE BIOGEL PI IND STRL 7.0 (GLOVE) IMPLANT
GLOVE BIOGEL PI INDICATOR 7.0 (GLOVE)
GLOVE EXAM NITRILE XL STR (GLOVE) IMPLANT
GLOVE INDICATOR 8.5 STRL (GLOVE) ×2 IMPLANT
GOWN STRL REUS W/ TWL LRG LVL3 (GOWN DISPOSABLE) IMPLANT
GOWN STRL REUS W/ TWL XL LVL3 (GOWN DISPOSABLE) ×1 IMPLANT
GOWN STRL REUS W/TWL 2XL LVL3 (GOWN DISPOSABLE) IMPLANT
GOWN STRL REUS W/TWL LRG LVL3 (GOWN DISPOSABLE)
GOWN STRL REUS W/TWL XL LVL3 (GOWN DISPOSABLE) ×1
HEMOSTAT POWDER SURGIFOAM 1G (HEMOSTASIS) ×2 IMPLANT
KIT BASIN OR (CUSTOM PROCEDURE TRAY) ×2 IMPLANT
KIT TURNOVER KIT B (KITS) ×2 IMPLANT
MARKER SKIN DUAL TIP RULER LAB (MISCELLANEOUS) ×2 IMPLANT
NEEDLE HYPO 25X1 1.5 SAFETY (NEEDLE) ×2 IMPLANT
NEEDLE SPNL 20GX3.5 QUINCKE YW (NEEDLE) ×2 IMPLANT
NS IRRIG 1000ML POUR BTL (IV SOLUTION) ×2 IMPLANT
OIL CARTRIDGE MAESTRO DRILL (MISCELLANEOUS) ×2
PACK LAMINECTOMY NEURO (CUSTOM PROCEDURE TRAY) ×2 IMPLANT
PAD ARMBOARD 7.5X6 YLW CONV (MISCELLANEOUS) ×6 IMPLANT
PIN MAYFIELD SKULL DISP (PIN) ×2 IMPLANT
RUBBERBAND STERILE (MISCELLANEOUS) IMPLANT
SPONGE LAP 4X18 RFD (DISPOSABLE) IMPLANT
SPONGE SURGIFOAM ABS GEL 100 (HEMOSTASIS) ×2 IMPLANT
STRIP CLOSURE SKIN 1/2X4 (GAUZE/BANDAGES/DRESSINGS) ×2 IMPLANT
SUT ETHILON 4 0 PS 2 18 (SUTURE) IMPLANT
SUT VIC AB 0 CT1 18XCR BRD8 (SUTURE) ×1 IMPLANT
SUT VIC AB 0 CT1 8-18 (SUTURE) ×1
SUT VIC AB 2-0 CT1 18 (SUTURE) ×2 IMPLANT
SUT VIC AB 4-0 PS2 27 (SUTURE) ×2 IMPLANT
SYR CONTROL 10ML LL (SYRINGE) ×2 IMPLANT
TOWEL GREEN STERILE (TOWEL DISPOSABLE) ×2 IMPLANT
TOWEL GREEN STERILE FF (TOWEL DISPOSABLE) ×2 IMPLANT
TRAY FOLEY MTR SLVR 16FR STAT (SET/KITS/TRAYS/PACK) IMPLANT
WATER STERILE IRR 1000ML POUR (IV SOLUTION) ×2 IMPLANT

## 2019-02-06 NOTE — H&P (Signed)
Xavier Livingston is an 63 y.o. male.   Chief Complaint: neck and left arm pain HPI: 63 year old and with long-standing neck pain and undergone an ACDF and pseudoarthrosis underwent posterior cervical fusion and loosen up those screws and hardware as well. He now has persistent left C7 distribution radicular pain and workup has shown loose C6 and C7 screws on the left and loose C7 screw on the right looks like he's gone on and fused anteriorly so I recommended reexploration of his posterior cervical fusion removal of hardware foraminotomies of the left C7 nerve root assessing of his fusion possible reimplantation of screws but if the fusion appears to be solid and we won't. I've extensively gone over this with him as well as perioperative course expectations of outcome and alternatives of surgery and he understands and agrees to proceed forward.  Past Medical History:  Diagnosis Date  . Anxiety   . Arthritis   . Complication of anesthesia    diff swallowing,reflx  . Dyspnea   . Dysrhythmia    irregular heartbeat in the 80's  . Enlarged prostate   . GERD (gastroesophageal reflux disease)   . Headache   . Hyperlipemia   . Hypertension   . Nocturia more than twice per night   . Reflux esophagitis   . Seizures ([place])    last at age 68 or 63 years old  . Smoker within last 12 months   . Swallowing difficulty     Past Surgical History:  Procedure Laterality Date  . ANTERIOR CERVICAL DECOMPRESSION/DISCECTOMY FUSION 4 LEVELS N/A 04/11/2015   Procedure: Anterior Cervical Discectomy/ Decompression Fusion Cervical three-four, Cervical four-five, Cervical five-six, Cervical six- seven;  Surgeon: Kary Kos, MD;  Location: [facility];  Service: Neurosurgery;  Laterality: N/A;  . BACK SURGERY    . COLONOSCOPY N/A 08/18/2014   Procedure: COLONOSCOPY;  Surgeon: Rogene Houston, MD;  [facility];  Service: Endoscopy;  Laterality: N/A;  1030-moved to 1200 Ann to notify pt  . ESOPHAGEAL  DILATION N/A 11/29/2016   Procedure: ESOPHAGEAL DILATION;  Surgeon: Rogene Houston, MD;  [facility];  Service: Endoscopy;  Laterality: N/A;  . ESOPHAGEAL DILATION N/A 05/16/2017   Procedure: ESOPHAGEAL DILATION;  Surgeon: Rogene Houston, MD;  [facility];  Service: Endoscopy;  Laterality: N/A;  . ESOPHAGOGASTRODUODENOSCOPY N/A 08/18/2014   Procedure: ESOPHAGOGASTRODUODENOSCOPY (EGD);  Surgeon: Rogene Houston, MD;  [facility];  Service: Endoscopy;  Laterality: N/A;  . ESOPHAGOGASTRODUODENOSCOPY N/A 11/29/2016   Procedure: ESOPHAGOGASTRODUODENOSCOPY (EGD);  Surgeon: Rogene Houston, MD;  [facility];  Service: Endoscopy;  Laterality: N/A;  12:00  . ESOPHAGOGASTRODUODENOSCOPY (EGD) WITH PROPOFOL N/A 05/16/2017   Procedure: ESOPHAGOGASTRODUODENOSCOPY (EGD) WITH PROPOFOL;  Surgeon: Rogene Houston, MD;  [facility];  Service: Endoscopy;  Laterality: N/A;  8:30  . excision of mass of back    . MALONEY DILATION  08/18/2014   Procedure: MALONEY DILATION;  Surgeon: Rogene Houston, MD;  [facility];  Service: Endoscopy;;  . MASS EXCISION N/A 10/21/2013   Procedure: EXCISION MASSES X 2 ON BACK;  Surgeon: Scherry Ran, MD;  [facility];  Service: General;  Laterality: N/A;  . NASAL SEPTUM SURGERY    . POSTERIOR CERVICAL FUSION/FORAMINOTOMY N/A 02/05/2018   Procedure: Posterior Cervical Fusion with lateral mass fixation - Cervical six-seven;  Surgeon: Kary Kos, MD;  Location: [facility];  Service: Neurosurgery;  Laterality: N/A;  History reviewed. No pertinent family history. Social History:  reports that he quit smoking about a year ago. His smoking use included cigarettes. He has a 10.00 pack-year smoking history. He has never used smokeless tobacco. He reports current alcohol use of about 6.0 standard drinks of alcohol per week. He reports current drug use.  Allergies: No Known Allergies  Medications Prior to Admission   Medication Sig Dispense Refill  . amLODipine (NORVASC) 5 MG tablet Take 5 mg by mouth daily.     . naproxen (NAPROSYN) 500 MG tablet Take 500 mg by mouth 2 (two) times daily with a meal.    . oxyCODONE-acetaminophen (PERCOCET/ROXICET) 5-325 MG tablet Take 1 tablet by mouth 4 (four) times daily.    . pravastatin (PRAVACHOL) 40 MG tablet Take 40 mg by mouth daily.    . cyclobenzaprine (FLEXERIL) 10 MG tablet Take 1 tablet (10 mg total) by mouth 3 (three) times daily as needed for muscle spasms. (Patient not taking: Reported on 01/16/2019) 40 tablet 0  . oxyCODONE 10 MG TABS Take 1 tablet (10 mg total) by mouth 2 (two) times daily as needed (pain). (Patient not taking: Reported on 01/16/2019) 60 tablet 0  . pantoprazole (PROTONIX) 40 MG tablet Take 1 tablet (40 mg total) by mouth daily before breakfast. (Patient not taking: Reported on 01/09/2018) 30 tablet 5    No results found for this or any previous visit (from the past 48 hour(s)). No results found.  Review of Systems  Musculoskeletal: Positive for joint pain and neck pain.  Neurological: Positive for tingling and sensory change.    Blood pressure (!) 174/91, pulse 66, temperature 98 F (36.7 C), temperature source Oral, resp. rate 20, height 5\' 11"  (1.803 m), weight 78.9 kg, SpO2 100 %. Physical Exam  Constitutional: He is oriented to person, place, and time. He appears well-developed and well-nourished.  HENT:  Head: Normocephalic.  Eyes: Pupils are equal, round, and reactive to light.  Neck: Normal range of motion.  Respiratory: Effort normal.  GI: Soft.  Musculoskeletal: Normal range of motion.  Neurological: He is alert and oriented to person, place, and time. He has normal strength. GCS eye subscore is 4. GCS verbal subscore is 5. GCS motor subscore is 6.  5 of 5 in deltoid, bicep, tricep, wrist flexion, wrist extension, hand intrinsics.  Skin: Skin is warm and dry.     Assessment/Plan Reexploration of posterior cervical  wound for removal of hardware left C7 foraminotomy and possible replacement of hardware  Michail Boyte P, MD 02/06/2019, 1:32 PM

## 2019-02-06 NOTE — Op Note (Signed)
Preoperative diagnosis: C7 possible C8 radiculopathy with possible pseudoarthrosis C6-7 with loosening of bilateral C6 and C7 lateral mass screws posteriorly  Postoperative diagnosis: Same  Procedure: Reexploration of posterior cervical fusion C6-7 with removal of hardware bilaterally at C6-7 and foraminotomies of the left C7 and C8 nerve roots with in situ non-instrumented posterior lateral arthrodesis C6-T1 utilizing  Locally harvested autograft mixed with vivigen  Surgeon: Dominica Severin Jie Stickels  Asst.: Nash Shearer  Anesthesia: Gen.  EBL: Minimal  History of present illness: 63 year old gentleman who underwent ACDF from C3-C7 had a pseudoarthrosis C6-7 underwent a posterior cervical fusion at C6-7 and ultimately look like he might've fused the anterior graft at C6-7 however posteriorly loosened his hardware and has had progressive worsening neck pain and left shoulder and arm pain that road rate at times radiating to the first thing his of his left hand times last thing as of his left hand. Workup extensively with CT scan MRI scan showed a C7 and C6 screw loosening with migration of the facet joint some residual foraminal stenosis C7 nerve root no evidence of instability at C7-T1 possibly due to the migration of the 7 screw possibly some foraminal stenosis of the C8 nerve root. So due to the patient's progressive clinical syndrome imaging findings and failure conservative treatmentI recommended reexploration of fusion removal of hardware possible replacement of hardware if it was infused with foraminotomies of the left C7 and C8 nerve roots. I extensively gone over the risks and benefits of the procedure with him as well as perioperative course expectations of outcome and alternatives of surgery and he understood and agreed to proceed 4.  Operative procedure: Patient brought into the or was to send general anesthesia positioned prone in pins and neck in slight flexion. Backside of his head neck was prepped  and draped in routine sterile fashion as old incision was opened up the scar tissues dissected free identify the hardware all the screws were felt to be loose remove the entire construct and dissected out the lateral masses and the lamina bilaterally and initiated my foraminotomies by removing the inferior aspect of the C6 lamina super aspect C7 as well as inferior aspect of 7 super aspect T1. Tracking both medial facets identified the C7 pedicle and T1 pedicle and decompress the C7 and C8 nerve roots respectively.I did not appreciate any overt instability however I decided to go ahead and I aggressively decorticated the lateral masses and facet joints from C6 down to T1 and laid some autograft and diligent in the facet joints posterior laterally without instrumentation just augment his fusion. Meticulous in a stasis was maintained and the wounds and closed in layers with interrupted Vicryl and a running 4 subcuticular. Dermabond benzo and Steri-Strips and sterile dressing was applied patient recovered in stable tissue. At the end of case on the account sponge counts were correct.

## 2019-02-06 NOTE — Transfer of Care (Signed)
Immediate Anesthesia Transfer of Care Note  Patient: Xavier Livingston  Procedure(s) Performed: Posterior Cervical Fusion with lateral mass fixation redo C6-T1 (N/A )  Patient Location: PACU  Anesthesia Type:General  Level of Consciousness: awake, alert  and oriented  Airway & Oxygen Therapy: Patient Spontanous Breathing and Patient connected to nasal cannula oxygen  Post-op Assessment: Report given to RN, Post -op Vital signs reviewed and stable and Patient moving all extremities  Post vital signs: Reviewed and stable  Last Vitals:  Vitals Value Taken Time  BP 140/123 02/06/2019  4:18 PM  Temp    Pulse 94 02/06/2019  4:19 PM  Resp 19 02/06/2019  4:19 PM  SpO2 100 % 02/06/2019  4:19 PM  Vitals shown include unvalidated device data.  Last Pain:  Vitals:   02/06/19 1303  TempSrc:   PainSc: 7          Complications: No apparent anesthesia complications

## 2019-02-06 NOTE — Anesthesia Procedure Notes (Signed)
Procedure Name: Intubation Date/Time: 02/06/2019 2:26 PM Performed by: Leonor Liv, CRNA Pre-anesthesia Checklist: Patient identified, Emergency Drugs available, Suction available and Patient being monitored Patient Re-evaluated:Patient Re-evaluated prior to induction Oxygen Delivery Method: Circle System Utilized Preoxygenation: Pre-oxygenation with 100% oxygen Induction Type: IV induction Ventilation: Mask ventilation without difficulty Laryngoscope Size: Glidescope and 4 Grade View: Grade I Tube type: Oral Tube size: 7.5 mm Number of attempts: 1 Airway Equipment and Method: Oral airway,  Rigid stylet and Video-laryngoscopy Placement Confirmation: ETT inserted through vocal cords under direct vision,  positive ETCO2 and breath sounds checked- equal and bilateral Secured at: 23 cm Tube secured with: Tape Dental Injury: Teeth and Oropharynx as per pre-operative assessment  Difficulty Due To: Difficult Airway- due to reduced neck mobility and Difficulty was anticipated

## 2019-02-07 MED ORDER — OXYCODONE HCL 10 MG PO TABS: 10.0000 mg | ORAL_TABLET | ORAL | 0 refills | Status: DC | PRN

## 2019-02-07 NOTE — Discharge Instructions (Signed)
Wound Care  Keep the incision clean and dry remove the outer dressing in 2 days, leave the Steri-Strips intact. Wrap with Saran wrap for showers only Do not put any creams, lotions, or ointments on incision. Leave steri-strips on neck.  They will fall off by themselves.  Activity Walk each and every day, increasing distance each day. No lifting greater than 5 lbs.  Avoid excessive neck motion. No lifting no bending no twisting no driving or riding a car unless coming back and forth to see me. Wear neck brace at all times except when showering.   Diet Resume your normal diet.   Call Your Doctor If Any of These Occur Redness, drainage, or swelling at the wound.  Temperature greater than 101 degrees. Severe pain not relieved by pain medication. Incision starts to come apart.  Follow Up Appt Call today for appointment in 1-2 weeks ([phone]) or for problems.  If you have any hardware placed in your spine, you will need an x-ray before your appointment.   

## 2019-02-07 NOTE — Discharge Summary (Signed)
Physician Discharge Summary  Patient ID: Xavier Livingston MRN: [id number] DOB/AGE: [birth year] 63 y.o.  Admit date: 02/06/2019 Discharge date: 02/07/2019  Admission Diagnoses:  Discharge Diagnoses:  Active Problems:   Radiculopathy of cervical region   Discharged Condition: good  Hospital Course: Patient been to the hospital where he underwent uncomplicated posterior cervical decompression and fusion.  Postoperatively doing well.  Preoperative neck and upper extremity symptoms improved.  Standing and ambulating without difficulty.  No wound issues.  Ready for discharge home.  Consults:   Significant Diagnostic Studies:   Treatments:   Discharge Exam: Blood pressure (!) 152/89, pulse 60, temperature 97.8 F (36.6 C), resp. rate 18, height 5\' 11"  (1.803 m), weight 78.9 kg, SpO2 100 %. Awake and alert.  Oriented and appropriate.  Cranial nerve function intact.  Motor and sensory function extremities normal.  Wound clean and dry.  Chest and abdomen benign.  Disposition: Discharge disposition: 01-Home or Self Care        Allergies as of 02/07/2019   No Known Allergies     Medication List    TAKE these medications   amLODipine 5 MG tablet Commonly known as:  NORVASC Take 5 mg by mouth daily.   cyclobenzaprine 10 MG tablet Commonly known as:  FLEXERIL Take 1 tablet (10 mg total) by mouth 3 (three) times daily as needed for muscle spasms.   naproxen 500 MG tablet Commonly known as:  NAPROSYN Take 500 mg by mouth 2 (two) times daily with a meal.   Oxycodone HCl 10 MG Tabs Take 1 tablet (10 mg total) by mouth every 3 (three) hours as needed for severe pain ((score 7 to 10)). What changed:    when to take this  reasons to take this   oxyCODONE-acetaminophen 5-325 MG tablet Commonly known as:  PERCOCET/ROXICET Take 1 tablet by mouth 4 (four) times daily.   pantoprazole 40 MG tablet Commonly known as:  PROTONIX Take 1 tablet (40 mg total) by mouth daily before  breakfast.   pravastatin 40 MG tablet Commonly known as:  PRAVACHOL Take 40 mg by mouth daily.        Signed: Cooper Render [name] 02/07/2019, 9:02 AM

## 2019-02-07 NOTE — Progress Notes (Signed)
Patient alert and oriented, mae's well, voiding adequate amount of urine, swallowing without difficulty, no c/o pain at time of discharge. Patient discharged home with family. Script and discharged instructions given to patient. Patient and family stated understanding of instructions given. Patient has an appointment with Dr. Cram 

## 2019-02-07 NOTE — Evaluation (Signed)
Occupational Therapy Evaluation Patient Details Name: Xavier Livingston MRN: [id number] DOB: 10/29/1956 Today's Date: 02/07/2019    History of Present Illness Pt is a 63 y/o male admitted for C6-7 posterior cervical fusion. PMH:  anxiety, HTN, ACDF, back surgery.    Clinical Impression   Pt admitted for above, limited by pain and L sided weakness/tingling.  PTA reports independent with ADLs, IADLs and mobility, limited driving.  Educated on precautions, ADL compensatory techniques, recommendations, and safety.  Pt presents near baseline at modified independent level for self care, functional transfers and in room mobility, good recall and adherence to precautions after education.  Will have intermittent support at discharge, plans to use 3:1 in shower as chair.  Based on performance today, no further OT needs have been identified.  Thank you for this referral.  OT signing off.     Follow Up Recommendations  No OT follow up;Supervision - Intermittent    Equipment Recommendations  None recommended by OT    Recommendations for Other Services       Precautions / Restrictions Precautions Precautions: Cervical Precaution Booklet Issued: Yes (comment) Precaution Comments: reviewed precautions with patient  Required Braces or Orthoses: Cervical Brace Cervical Brace: Soft collar;For comfort Restrictions Weight Bearing Restrictions: No      Mobility Bed Mobility Overal bed mobility: Needs Assistance Bed Mobility: Rolling;Sidelying to Sit;Sit to Sidelying Rolling: Supervision Sidelying to sit: Supervision     Sit to sidelying: Supervision General bed mobility comments: supervision to complete log roll technique due to precautions   Transfers Overall transfer level: Modified independent Equipment used: None Transfers: Sit to/from Stand Sit to Stand: Modified independent (Device/Increase time)         General transfer comment: good posture and technique     Balance Overall  balance assessment: No apparent balance deficits (not formally assessed)                                         ADL either performed or assessed with clinical judgement   ADL Overall ADL's : Modified independent                                       General ADL Comments: pt at modified independent level for self care, transfers and mobility; educated on precautions, ADL compnesatory techniques and recommendations      Vision         Perception     Praxis      Pertinent Vitals/Pain Pain Assessment: Faces Faces Pain Scale: Hurts a little bit Pain Location: neck Pain Descriptors / Indicators: Discomfort;Operative site guarding Pain Intervention(s): Monitored during session     Hand Dominance Right   Extremity/Trunk Assessment Upper Extremity Assessment Upper Extremity Assessment: LUE deficits/detail;RUE deficits/detail RUE Deficits / Details: grossly 4+/5 MMT LUE Deficits / Details: grossly 4/5 MMT, reports tingling in fingertips    Lower Extremity Assessment Lower Extremity Assessment: Overall WFL for tasks assessed   Cervical / Trunk Assessment Cervical / Trunk Assessment: Other exceptions Cervical / Trunk Exceptions: s/p cervical sx   Communication Communication Communication: No difficulties   Cognition Arousal/Alertness: Awake/alert Behavior During Therapy: WFL for tasks assessed/performed Overall Cognitive Status: Within Functional Limits for tasks assessed  General Comments       Exercises     Shoulder Instructions      Home Living Family/patient expects to be discharged to:: Private residence Living Arrangements: Alone Available Help at Discharge: Family;Available PRN/intermittently Type of Home: House Home Access: Stairs to enter [employer] of Steps: 4 Entrance Stairs-Rails: Can reach both Home Layout: One level     Bathroom Shower/Tub: Emergency planning/management officer: Standard     Home Equipment: Bedside commode;Walker - 2 wheels   Additional Comments: reports plan to dc home with daughter initally      Prior Functioning/Environment Level of Independence: Independent        Comments: limited driving, independent ADLs/IADLs and mobility         OT Problem List: Decreased activity tolerance;Decreased knowledge of precautions;Pain;Decreased strength      OT Treatment/Interventions:      OT Goals(Current goals can be found in the care plan section) Acute Rehab OT Goals Patient Stated Goal: home today  OT Goal Formulation: With patient  OT Frequency:     Barriers to D/C:            Co-evaluation              AM-PAC OT "6 Clicks" Daily Activity     Outcome Measure Help from another person eating meals?: None Help from another person taking care of personal grooming?: None Help from another person toileting, which includes using toliet, bedpan, or urinal?: None Help from another person bathing (including washing, rinsing, drying)?: None Help from another person to put on and taking off regular upper body clothing?: None Help from another person to put on and taking off regular lower body clothing?: None 6 Click Score: 24   End of Session Equipment Utilized During Treatment: Cervical collar Nurse Communication: Mobility status  Activity Tolerance: Patient tolerated treatment well Patient left: in bed;with call bell/phone within reach  OT Visit Diagnosis: Pain;Muscle weakness (generalized) (M62.81) Pain - part of body: (neck)                Time: 1610-9604 OT Time Calculation (min): 14 min Charges:  OT General Charges $OT Visit: 1 Visit OT Evaluation $OT Eval Low Complexity: 1 Low  Delight Stare, OT Acute Rehabilitation Services Pager [phone] Office [phone]   Delight Stare 02/07/2019, 8:12 AM

## 2019-02-09 ENCOUNTER — Encounter (HOSPITAL_COMMUNITY): Payer: Self-pay | Admitting: Neurosurgery

## 2019-02-09 NOTE — Anesthesia Postprocedure Evaluation (Signed)
Anesthesia Post Note  Patient: Xavier Livingston  Procedure(s) Performed: Posterior Cervical Fusion with lateral mass fixation redo C6-T1 (N/A )     Patient location during evaluation: PACU Anesthesia Type: General Level of consciousness: awake and alert Pain management: pain level controlled Vital Signs Assessment: post-procedure vital signs reviewed and stable Respiratory status: spontaneous breathing, nonlabored ventilation and respiratory function stable Cardiovascular status: blood pressure returned to baseline and stable Postop Assessment: no apparent nausea or vomiting Anesthetic complications: no    Last Vitals:  Vitals:   02/07/19 0811 02/07/19 0912  BP: (!) 152/89 139/74  Pulse: 60 81  Resp: 18 19  Temp:    SpO2: 100% 99%    Last Pain:  Vitals:   02/07/19 0624  TempSrc:   PainSc: 3                  Kortney Potvin,W. EDMOND

## 2019-05-19 ENCOUNTER — Other Ambulatory Visit: Payer: Self-pay

## 2019-05-19 ENCOUNTER — Ambulatory Visit (INDEPENDENT_AMBULATORY_CARE_PROVIDER_SITE_OTHER): Payer: Medicaid Other | Admitting: Internal Medicine

## 2019-05-19 ENCOUNTER — Encounter (INDEPENDENT_AMBULATORY_CARE_PROVIDER_SITE_OTHER): Payer: Self-pay | Admitting: Internal Medicine

## 2019-05-19 VITALS — BP 144/78 | HR 72 | Temp 98.2°F | Ht 71.0 in | Wt 164.5 lb

## 2019-05-19 DIAGNOSIS — R1319 Other dysphagia: Secondary | ICD-10-CM

## 2019-05-19 DIAGNOSIS — R1310 Dysphagia, unspecified: Secondary | ICD-10-CM | POA: Diagnosis not present

## 2019-05-19 MED ORDER — PANTOPRAZOLE SODIUM 40 MG PO TBEC: 40.0000 mg | DELAYED_RELEASE_TABLET | Freq: Every day | ORAL | 3 refills | Status: DC

## 2019-05-19 NOTE — Patient Instructions (Signed)
Rx for Protonix sent to his pharmacy 

## 2019-05-19 NOTE — Progress Notes (Signed)
Subjective:    Patient ID: Xavier Livingston, male    DOB: [birth year], 63 y.o.   MRN: [id number] Takes Oxycodone once a day and sometimes not at all.  No taking ASA. Quit smoking 6 months ago (1 pack every 3-4 days) x 45 yrs.  HPI Presents today with c/o dysphagia. Hx of same in past. Last EGD/ED was in 2018.  Impression:               - Normal esophagus.                           - Z-line irregular, 38 cm from the incisors.                           - 2 cm hiatal hernia.                           - No endoscopic esophageal abnormality to explain                            patient's dysphagia. Esophagus dilated. Dilated.                           - Erythematous mucosa in the prepyloric region of                            the stomach.                           - Normal duodenal bulb and second portion of the                            duodenum. Today, he was drinking a soda, and said is "air" was cut off. He says he had a syncopal episode. He says sometimes he has trouble swallowing solids. He says mostly he has trouble swallowing liquids, corn and peas.  His appetite is okay. No weight loss. BMs move okay.  Neck surgery (fusion) February 11, 2019.  11/29/2016 EGD/ED: dysphagia:  Impression: - Normal esophagus. - Z-line irregular, 39 cm from the incisors.  Biopsied. - 2 cm hiatal hernia. - Normal stomach. - Normal duodenal bulb and second portion of the  duodenum. Comment: Esophageal dilation not attempted because  lack of cooperation  Biopsy from GE junction negative for Barrett's esophagus but shows pancreatic acinar cell metaplasia/heterotopia.   Review of Systems Past Medical History:  Diagnosis Date  . Anxiety   . Arthritis   . Complication of anesthesia     diff swallowing,reflx  . Dyspnea   . Dysrhythmia    irregular heartbeat in the 80's  . Enlarged prostate   . GERD (gastroesophageal reflux disease)   . Headache   . Hyperlipemia   . Hypertension   . Nocturia more than twice per night   . Reflux esophagitis   . Seizures ([place])    last at age 68 or 63 years old  . Smoker within last 12 months   . Swallowing difficulty     Past Surgical History:  Procedure Laterality Date  . ANTERIOR CERVICAL DECOMPRESSION/DISCECTOMY FUSION 4 LEVELS N/A 04/11/2015   Procedure: Anterior Cervical Discectomy/ Decompression Fusion  Cervical three-four, Cervical four-five, Cervical five-six, Cervical six- seven;  Surgeon: Kary Kos, MD;  Location: [facility];  Service: Neurosurgery;  Laterality: N/A;  . BACK SURGERY    . COLONOSCOPY N/A 08/18/2014   Procedure: COLONOSCOPY;  Surgeon: Rogene Houston, MD;  Location: [facility];  Service: Endoscopy;  Laterality: N/A;  1030-moved to 1200 Ann to notify pt  . ESOPHAGEAL DILATION N/A 11/29/2016   Procedure: ESOPHAGEAL DILATION;  Surgeon: Rogene Houston, MD;  Location: [facility];  Service: Endoscopy;  Laterality: N/A;  . ESOPHAGEAL DILATION N/A 05/16/2017   Procedure: ESOPHAGEAL DILATION;  Surgeon: Rogene Houston, MD;  [facility];  Service: Endoscopy;  Laterality: N/A;  . ESOPHAGOGASTRODUODENOSCOPY N/A 08/18/2014   Procedure: ESOPHAGOGASTRODUODENOSCOPY (EGD);  Surgeon: Rogene Houston, MD;  [facility];  Service: Endoscopy;  Laterality: N/A;  . ESOPHAGOGASTRODUODENOSCOPY N/A 11/29/2016   Procedure: ESOPHAGOGASTRODUODENOSCOPY (EGD);  Surgeon: Rogene Houston, MD;  [facility];  Service: Endoscopy;  Laterality: N/A;  12:00  . ESOPHAGOGASTRODUODENOSCOPY (EGD) WITH PROPOFOL N/A 05/16/2017   Procedure: ESOPHAGOGASTRODUODENOSCOPY (EGD) WITH PROPOFOL;  Surgeon: Rogene Houston, MD;  [facility];  Service: Endoscopy;  Laterality: N/A;  8:30  . excision of mass of back     . MALONEY DILATION  08/18/2014   Procedure: MALONEY DILATION;  Surgeon: Rogene Houston, MD;  [facility];  Service: Endoscopy;;  . MASS EXCISION N/A 10/21/2013   Procedure: EXCISION MASSES X 2 ON BACK;  Surgeon: Scherry Ran, MD;  Location: [facility];  Service: General;  Laterality: N/A;  . NASAL SEPTUM SURGERY    . POSTERIOR CERVICAL FUSION/FORAMINOTOMY N/A 02/05/2018   Procedure: Posterior Cervical Fusion with lateral mass fixation - Cervical six-seven;  Surgeon: Kary Kos, MD;  Location: [facility];  Service: Neurosurgery;  Laterality: N/A;  . POSTERIOR CERVICAL FUSION/FORAMINOTOMY N/A 02/06/2019   Procedure: Posterior Cervical Fusion with lateral mass fixation redo C6-T1;  Surgeon: Kary Kos, MD;  Location: [facility];  Service: Neurosurgery;  Laterality: N/A;    No Known Allergies  Current Outpatient Medications on File Prior to Visit  Medication Sig Dispense Refill  . amLODipine (NORVASC) 5 MG tablet Take 5 mg by mouth daily.     . Multiple Vitamin (MULTIVITAMIN) tablet Take 1 tablet by mouth daily.    . naproxen (NAPROSYN) 500 MG tablet Take 500 mg by mouth 2 (two) times daily with a meal.    . oxyCODONE 10 MG TABS Take 1 tablet (10 mg total) by mouth every 3 (three) hours as needed for severe pain ((score 7 to 10)). 40 tablet 0  . oxyCODONE-acetaminophen (PERCOCET/ROXICET) 5-325 MG tablet Take 1 tablet by mouth 4 (four) times daily.    . pravastatin (PRAVACHOL) 40 MG tablet Take 40 mg by mouth daily.     No current facility-administered medications on file prior to visit.         Objective:   Physical Exam Blood pressure (!) 144/78, pulse 72, temperature 98.2 F (36.8 C), height 5\' 11"  (1.803 m), weight 164 lb 8 oz (74.6 kg). Alert and oriented. Skin warm and dry. Oral mucosa is moist.   . Sclera anicteric, conjunctivae is pink. Thyroid not enlarged. No cervical lymphadenopathy. Bilateral whezes Heart regular rate and rhythm.  Abdomen is soft. Bowel sounds are positive.  No hepatomegaly. No abdominal masses felt. No tenderness.  No edema to lower extremities.          Assessment &  Plan:  Dysphagia. Am going to get an Esophagram. Further recommendations to follow  Rx for Protonix 40mg  daily sent to his pharmacy.

## 2019-05-28 ENCOUNTER — Other Ambulatory Visit (INDEPENDENT_AMBULATORY_CARE_PROVIDER_SITE_OTHER): Payer: Self-pay | Admitting: Internal Medicine

## 2019-05-28 ENCOUNTER — Ambulatory Visit (HOSPITAL_COMMUNITY)
Admission: RE | Admit: 2019-05-28 | Discharge: 2019-05-28 | Disposition: A | Discharge status: Home / Self Care | Payer: Medicaid Other | Source: Ambulatory Visit | Attending: Internal Medicine | Admitting: Internal Medicine

## 2019-05-28 ENCOUNTER — Telehealth (INDEPENDENT_AMBULATORY_CARE_PROVIDER_SITE_OTHER): Payer: Self-pay | Admitting: Internal Medicine

## 2019-05-28 ENCOUNTER — Other Ambulatory Visit: Payer: Self-pay

## 2019-05-28 DIAGNOSIS — R933 Abnormal findings on diagnostic imaging of other parts of digestive tract: Secondary | ICD-10-CM

## 2019-05-28 DIAGNOSIS — R1310 Dysphagia, unspecified: Secondary | ICD-10-CM | POA: Diagnosis not present

## 2019-05-28 DIAGNOSIS — R1319 Other dysphagia: Secondary | ICD-10-CM

## 2019-05-28 NOTE — Telephone Encounter (Signed)
Xavier Livingston, EGD/possible ED. Dysphagia. Abnormal Esophagram.

## 2019-06-01 ENCOUNTER — Encounter (INDEPENDENT_AMBULATORY_CARE_PROVIDER_SITE_OTHER): Payer: Self-pay | Admitting: *Deleted

## 2019-06-01 DIAGNOSIS — R933 Abnormal findings on diagnostic imaging of other parts of digestive tract: Secondary | ICD-10-CM | POA: Insufficient documentation

## 2019-06-01 NOTE — Telephone Encounter (Signed)
EGD/ED sch'd 07/29/19, patient aware, instructions mailed

## 2019-07-27 ENCOUNTER — Other Ambulatory Visit (HOSPITAL_COMMUNITY)
Admission: RE | Admit: 2019-07-27 | Discharge: 2019-07-27 | Disposition: A | Discharge status: Home / Self Care | Payer: Medicaid Other | Source: Ambulatory Visit | Attending: Internal Medicine | Admitting: Internal Medicine

## 2019-07-27 ENCOUNTER — Other Ambulatory Visit: Payer: Self-pay

## 2019-07-27 DIAGNOSIS — R933 Abnormal findings on diagnostic imaging of other parts of digestive tract: Secondary | ICD-10-CM | POA: Insufficient documentation

## 2019-07-27 DIAGNOSIS — Z01812 Encounter for preprocedural laboratory examination: Secondary | ICD-10-CM | POA: Insufficient documentation

## 2019-07-27 DIAGNOSIS — R1310 Dysphagia, unspecified: Secondary | ICD-10-CM | POA: Diagnosis not present

## 2019-07-27 DIAGNOSIS — Z20828 Contact with and (suspected) exposure to other viral communicable diseases: Secondary | ICD-10-CM | POA: Diagnosis not present

## 2019-07-28 LAB — SARS CORONAVIRUS 2 (TAT 6-24 HRS): SARS Coronavirus 2: NEGATIVE

## 2019-07-29 ENCOUNTER — Encounter (HOSPITAL_COMMUNITY)
Admission: RE | Disposition: A | Discharge status: Home / Self Care | Payer: Self-pay | Source: Home / Self Care | Attending: Internal Medicine

## 2019-07-29 ENCOUNTER — Ambulatory Visit (HOSPITAL_COMMUNITY)
Admission: RE | Admit: 2019-07-29 | Discharge: 2019-07-29 | Disposition: A | Discharge status: Home / Self Care | Payer: Medicaid Other | Attending: Internal Medicine | Admitting: Internal Medicine

## 2019-07-29 ENCOUNTER — Other Ambulatory Visit: Payer: Self-pay

## 2019-07-29 ENCOUNTER — Encounter (HOSPITAL_COMMUNITY): Payer: Self-pay | Admitting: *Deleted

## 2019-07-29 DIAGNOSIS — M1990 Unspecified osteoarthritis, unspecified site: Secondary | ICD-10-CM | POA: Insufficient documentation

## 2019-07-29 DIAGNOSIS — F419 Anxiety disorder, unspecified: Secondary | ICD-10-CM | POA: Diagnosis not present

## 2019-07-29 DIAGNOSIS — I10 Essential (primary) hypertension: Secondary | ICD-10-CM | POA: Diagnosis not present

## 2019-07-29 DIAGNOSIS — Z79899 Other long term (current) drug therapy: Secondary | ICD-10-CM | POA: Insufficient documentation

## 2019-07-29 DIAGNOSIS — R933 Abnormal findings on diagnostic imaging of other parts of digestive tract: Secondary | ICD-10-CM | POA: Diagnosis not present

## 2019-07-29 DIAGNOSIS — R1310 Dysphagia, unspecified: Secondary | ICD-10-CM

## 2019-07-29 DIAGNOSIS — E785 Hyperlipidemia, unspecified: Secondary | ICD-10-CM | POA: Diagnosis not present

## 2019-07-29 DIAGNOSIS — R1319 Other dysphagia: Secondary | ICD-10-CM

## 2019-07-29 DIAGNOSIS — K228 Other specified diseases of esophagus: Secondary | ICD-10-CM | POA: Diagnosis not present

## 2019-07-29 DIAGNOSIS — Z87891 Personal history of nicotine dependence: Secondary | ICD-10-CM | POA: Diagnosis not present

## 2019-07-29 DIAGNOSIS — K219 Gastro-esophageal reflux disease without esophagitis: Secondary | ICD-10-CM | POA: Diagnosis not present

## 2019-07-29 DIAGNOSIS — R1314 Dysphagia, pharyngoesophageal phase: Secondary | ICD-10-CM | POA: Insufficient documentation

## 2019-07-29 HISTORY — PX: ESOPHAGOGASTRODUODENOSCOPY: SHX5428

## 2019-07-29 HISTORY — PX: ESOPHAGEAL DILATION: SHX303

## 2019-07-29 SURGERY — EGD (ESOPHAGOGASTRODUODENOSCOPY)
Anesthesia: Moderate Sedation

## 2019-07-29 MED ORDER — SODIUM CHLORIDE 0.9 % IV SOLN
INTRAVENOUS | Status: DC
  Administered 2019-07-29: 13:00:00 via INTRAVENOUS

## 2019-07-29 MED ORDER — ESOMEPRAZOLE MAGNESIUM 40 MG PO PACK: 40.0000 mg | PACK | Freq: Every day | ORAL | 5 refills | Status: DC

## 2019-07-29 MED ORDER — MIDAZOLAM HCL 5 MG/5ML IJ SOLN
INTRAMUSCULAR | Status: DC | PRN
  Administered 2019-07-29 (×4): 2 mg via INTRAVENOUS
  Administered 2019-07-29 (×2): 1 mg via INTRAVENOUS

## 2019-07-29 MED ORDER — LIDOCAINE VISCOUS HCL 2 % MT SOLN
OROMUCOSAL | Status: DC | PRN
  Administered 2019-07-29: 1 via OROMUCOSAL

## 2019-07-29 MED ORDER — MEPERIDINE HCL 50 MG/ML IJ SOLN
INTRAMUSCULAR | Status: AC
  Filled 2019-07-29: qty 1

## 2019-07-29 MED ORDER — MIDAZOLAM HCL 5 MG/5ML IJ SOLN
INTRAMUSCULAR | Status: AC
  Filled 2019-07-29: qty 10

## 2019-07-29 MED ORDER — LIDOCAINE VISCOUS HCL 2 % MT SOLN
OROMUCOSAL | Status: AC
  Filled 2019-07-29: qty 15

## 2019-07-29 MED ORDER — MEPERIDINE HCL 50 MG/ML IJ SOLN
INTRAMUSCULAR | Status: DC | PRN
  Administered 2019-07-29 (×4): 25 mg

## 2019-07-29 NOTE — Op Note (Signed)
[facility] Patient Name: Xavier Livingston Procedure Date: 07/29/2019 1:56 PM MRN: [id number] Date of Birth: 1956/05/16 Attending MD: Hildred Laser , MD CSN: [id number] Age: 63 Admit Type: Outpatient Procedure:                Upper GI endoscopy Indications:              Esophageal dysphagia, Abnormal esophagram Providers:                Hildred Laser, MD, Janeece Riggers, RN, Raphael Gibney,                            Technician Referring MD:             Ralene Bathe. Dondiego, MD Medicines:                Lidocaine jelly, Meperidine 100 mg IV, Midazolam 10                            mg IV Complications:            No immediate complications. Estimated Blood Loss:     Estimated blood loss: none. Procedure:                Pre-Anesthesia Assessment:                           - Prior to the procedure, a History and Physical                            was performed, and patient medications and                            allergies were reviewed. The patient's tolerance of                            previous anesthesia was also reviewed. The risks                            and benefits of the procedure and the sedation                            options and risks were discussed with the patient.                            All questions were answered, and informed consent                            was obtained. Prior Anticoagulants: The patient has                            taken no previous anticoagulant or antiplatelet                            agents. ASA Grade Assessment: II - A patient with  mild systemic disease. After reviewing the risks                            and benefits, the patient was deemed in                            satisfactory condition to undergo the procedure.                           After obtaining informed consent, the endoscope was                            passed under direct vision. Throughout the   procedure, the patient's blood pressure, pulse, and                            oxygen saturations were monitored continuously. The                            [id number]) was introduced through the                            mouth, and advanced to the second part of duodenum.                            The upper GI endoscopy was accomplished without                            difficulty. The patient tolerated the procedure                            well. Scope In: 2:25:18 PM Scope Out: 2:36:36 PM Total Procedure Duration: 0 hours 11 minutes 18 seconds  Findings:      The examined esophagus was normal. The scope was withdrawn. Dilation was       performed with a Maloney dilator with no resistance at 56 Fr. The       dilation site was examined following endoscope reinsertion and showed no       change and no bleeding, mucosal tear or perforation.      The Z-line was irregular and was found 41 cm from the incisors.      The entire examined stomach was normal.      The duodenal bulb and second portion of the duodenum were normal. Impression:               - Normal esophagus. Dilated.                           - Z-line irregular, 41 cm from the incisors.                           - Normal stomach.                           - Normal duodenal bulb and second portion of the  duodenum.                           - No specimens collected. Moderate Sedation:      Moderate (conscious) sedation was administered by the endoscopy nurse       and supervised by the endoscopist. The following parameters were       monitored: oxygen saturation, heart rate, blood pressure, CO2       capnography and response to care. Total physician intraservice time was       18 minutes. Recommendation:           - Patient has a contact number available for                            emergencies. The signs and symptoms of potential                            delayed complications were  discussed with the                            patient. Return to normal activities tomorrow.                            Written discharge instructions were provided to the                            patient.                           - Resume previous diet today.                           - Discontinue Pantoprazole but continue present                            medications.                           - Esomeprazole 40 mfg po qam.                           - Telephone GI clinic in 1 week. Procedure Code(s):        --- Professional ---                           [phone], Esophagogastroduodenoscopy, flexible,                            transoral; diagnostic, including collection of                            specimen(s) by brushing or washing, when performed                            (separate procedure)  [id number], Dilation of esophagus, by unguided sound or                            bougie, single or multiple passes                           G0500, Moderate sedation services provided by the                            same physician or other qualified health care                            professional performing a gastrointestinal                            endoscopic service that sedation supports,                            requiring the presence of an independent trained                            observer to assist in the monitoring of the                            patient's level of consciousness and physiological                            status; initial 15 minutes of intra-service time;                            patient age 32 years or older (additional time may                            be reported with [phone], as appropriate) Diagnosis Code(s):        --- Professional ---                           K22.8, Other specified diseases of esophagus                           R13.14, Dysphagia, pharyngoesophageal phase                           R93.3, Abnormal  findings on diagnostic imaging of                            other parts of digestive tract CPT copyright 2019 [employer]. All rights reserved. The codes documented in this report are preliminary and upon coder review may  be revised to meet current compliance requirements. Hildred Laser, MD Hildred Laser, MD 07/29/2019 2:50:54 PM This report has been signed electronically. Number of Addenda: 0

## 2019-07-29 NOTE — H&P (Signed)
Xavier Livingston is an 63 y.o. male.   Chief Complaint: Patient is here for esophagogastroduodenoscopy and esophageal dilation. HPI: Patient 63 year old Caucasian male who has chronic GERD who who presents with dysphagia to solids as well as liquids.  He states heartburn is not well controlled with pantoprazole.  He is having daily heartburn.  He states he has had dysphagia both to solids and liquids and has been gradually getting better.  He did have esophageal dilation but no structural abnormality was noted.  [place] states he could swallow good for only couple days.  He says he has lost 50 pounds this year because of his ability to swallow.  He had barium pill esophagogram few weeks ago.  Revealed narrowing at the distal esophagus which eventually suggesting spasm rather than a stricture.  He denies nausea vomiting hematemesis melena or rectal bleeding. He does not take OTC NSAIDs. He does not smoke cigarettes anymore.  He drinks of beer usually on weekends.  Past Medical History:  Diagnosis Date  . Anxiety   . Arthritis   . Complication of anesthesia    diff swallowing,reflx  . Dyspnea   . Dysrhythmia    irregular heartbeat in the 80's  . Enlarged prostate   . GERD (gastroesophageal reflux disease)   . Headache   . Hyperlipemia   . Hypertension   . Nocturia more than twice per night   .    . Seizures ([place])    last at age 57 or 63 years old  . Smoker within last 12 months   . Swallowing difficulty       Past Surgical History:  Procedure Laterality Date  . ANTERIOR CERVICAL DECOMPRESSION/DISCECTOMY FUSION 4 LEVELS N/A 04/11/2015   Procedure: Anterior Cervical Discectomy/ Decompression Fusion Cervical three-four, Cervical four-five, Cervical five-six, Cervical six- seven;  Surgeon: Kary Kos, MD;  Location: [facility];  Service: Neurosurgery;  Laterality: N/A;  . BACK SURGERY    . COLONOSCOPY N/A 08/18/2014   Procedure: COLONOSCOPY;  Surgeon: Rogene Houston, MD;  [facility];  Service: Endoscopy;  Laterality: N/A;  1030-moved to 1200 Ann to notify pt  . ESOPHAGEAL DILATION N/A 11/29/2016   Procedure: ESOPHAGEAL DILATION;  Surgeon: Rogene Houston, MD;  [facility];  Service: Endoscopy;  Laterality: N/A;  . ESOPHAGEAL DILATION N/A 05/16/2017   Procedure: ESOPHAGEAL DILATION;  Surgeon: Rogene Houston, MD;  [facility];  Service: Endoscopy;  Laterality: N/A;  . ESOPHAGOGASTRODUODENOSCOPY N/A 08/18/2014   Procedure: ESOPHAGOGASTRODUODENOSCOPY (EGD);  Surgeon: Rogene Houston, MD;  [facility];  Service: Endoscopy;  Laterality: N/A;  . ESOPHAGOGASTRODUODENOSCOPY N/A 11/29/2016   Procedure: ESOPHAGOGASTRODUODENOSCOPY (EGD);  Surgeon: Rogene Houston, MD;  [facility];  Service: Endoscopy;  Laterality: N/A;  12:00  . ESOPHAGOGASTRODUODENOSCOPY (EGD) WITH PROPOFOL N/A 05/16/2017   Procedure: ESOPHAGOGASTRODUODENOSCOPY (EGD) WITH PROPOFOL;  Surgeon: Rogene Houston, MD;  [facility];  Service: Endoscopy;  Laterality: N/A;  8:30  . excision of mass of back    . MALONEY DILATION  08/18/2014   Procedure: MALONEY DILATION;  Surgeon: Rogene Houston, MD;  [facility];  Service: Endoscopy;;  . MASS EXCISION N/A 10/21/2013   Procedure: EXCISION MASSES X 2 ON BACK;  Surgeon: Scherry Ran, MD;  Location: [facility];  Service: General;  Laterality: N/A;  . NASAL SEPTUM SURGERY    . POSTERIOR CERVICAL FUSION/FORAMINOTOMY N/A 02/05/2018   Procedure: Posterior Cervical Fusion with  lateral mass fixation - Cervical six-seven;  Surgeon: Kary Kos, MD;  Location: [facility];  Service: Neurosurgery;  Laterality: N/A;  . POSTERIOR CERVICAL FUSION/FORAMINOTOMY N/A 02/06/2019   Procedure: Posterior Cervical Fusion with lateral mass fixation redo C6-T1;  Surgeon: Kary Kos, MD;  Location: [facility];  Service: Neurosurgery;  Laterality: N/A;    History reviewed. No pertinent family history. Social History:  reports that he quit  smoking about 18 months ago. His smoking use included cigarettes. He has a 10.00 pack-year smoking history. He has never used smokeless tobacco. He reports current alcohol use of about 6.0 standard drinks of alcohol per week. He reports current drug use.  Allergies: No Known Allergies  Medications Prior to Admission  Medication Sig Dispense Refill  . amLODipine (NORVASC) 5 MG tablet Take 5 mg by mouth daily.     . cholecalciferol (VITAMIN D3) 25 MCG (1000 UT) tablet Take 1,000 Units by mouth daily.    [name] oxyCODONE-acetaminophen (PERCOCET) 10-325 MG tablet Take 1 tablet by mouth 2 (two) times daily as needed for pain.    . pantoprazole (PROTONIX) 40 MG tablet Take 1 tablet (40 mg total) by mouth daily. (Patient taking differently: Take 40 mg by mouth daily before breakfast. ) 90 tablet 3  . pravastatin (PRAVACHOL) 40 MG tablet Take 40 mg by mouth daily.      No results found for this or any previous visit (from the past 48 hour(s)). No results found.  ROS  Blood pressure (!) 152/81, pulse 74, temperature 98.1 F (36.7 C), temperature source Oral, resp. rate (!) 4, height 5\' 11"  (1.803 m), weight 74.8 kg, SpO2 100 %. Physical Exam  Constitutional: He appears well-developed and well-nourished.  HENT:  Mouth/Throat: Oropharynx is clear and moist.  Eyes: Conjunctivae are normal. No scleral icterus.  Neck: No thyromegaly present.  Cardiovascular: Normal rate, regular rhythm and normal heart sounds.  No murmur heard. Respiratory: Effort normal and breath sounds normal.  GI:  Abdomen is symmetrical and soft with mild midepigastric tenderness.  No organomegaly or masses.  Musculoskeletal:        General: No edema.  Lymphadenopathy:    He has no cervical adenopathy.  Neurological: He is alert.  Skin: Skin is warm and dry.     Assessment/Plan Esophageal dysphagia possibly due to esophageal motility disorder. Abnormal esophagogram. Esophagogastroduodenoscopy with esophageal  dilation.  Hildred Laser, MD 07/29/2019, 2:12 PM

## 2019-07-29 NOTE — Discharge Instructions (Signed)
Discontinue pantoprazole but resume other medications as before. Nexium/esomeprazole 40 mg by mouth 30 minutes before breakfast daily. Resume diet. Anti-reflux sheet. No driving for 24 hours. Please call office with progress report in 1 week.   Gastroesophageal Reflux Disease, Adult Gastroesophageal reflux (GER) happens when acid from the stomach flows up into the tube that connects the mouth and the stomach (esophagus). Normally, food travels down the esophagus and stays in the stomach to be digested. However, when a person has GER, food and stomach acid sometimes move back up into the esophagus. If this becomes a more serious problem, the person may be diagnosed with a disease called gastroesophageal reflux disease (GERD). GERD occurs when the reflux:  Happens often.  Causes frequent or severe symptoms.  Causes problems such as damage to the esophagus. When stomach acid comes in contact with the esophagus, the acid may cause soreness (inflammation) in the esophagus. Over time, GERD may create small holes (ulcers) in the lining of the esophagus. What are the causes? This condition is caused by a problem with the muscle between the esophagus and the stomach (lower esophageal sphincter, or LES). Normally, the LES muscle closes after food passes through the esophagus to the stomach. When the LES is weakened or abnormal, it does not close properly, and that allows food and stomach acid to go back up into the esophagus. The LES can be weakened by certain dietary substances, medicines, and medical conditions, including:  Tobacco use.  Pregnancy.  Having a hiatal hernia.  Alcohol use.  Certain foods and beverages, such as coffee, chocolate, onions, and peppermint. What increases the risk? You are more likely to develop this condition if you:  Have an increased body weight.  Have a connective tissue disorder.  Use NSAID medicines. What are the signs or symptoms? Symptoms of this  condition include:  Heartburn.  Difficult or painful swallowing.  The feeling of having a lump in the throat.  Abitter taste in the mouth.  Bad breath.  Having a large amount of saliva.  Having an upset or bloated stomach.  Belching.  Chest pain. Different conditions can cause chest pain. Make sure you see your health care provider if you experience chest pain.  Shortness of breath or wheezing.  Ongoing (chronic) cough or a night-time cough.  Wearing away of tooth enamel.  Weight loss. How is this diagnosed? Your health care provider will take a medical history and perform a physical exam. To determine if you have mild or severe GERD, your health care provider may also monitor how you respond to treatment. You may also have tests, including:  A test to examine your stomach and esophagus with a small camera (endoscopy).  A test thatmeasures the acidity level in your esophagus.  A test thatmeasures how much pressure is on your esophagus.  A barium swallow or modified barium swallow test to show the shape, size, and functioning of your esophagus. How is this treated? The goal of treatment is to help relieve your symptoms and to prevent complications. Treatment for this condition may vary depending on how severe your symptoms are. Your health care provider may recommend:  Changes to your diet.  Medicine.  Surgery. Follow these instructions at home: Eating and drinking   Follow a diet as recommended by your health care provider. This may involve avoiding foods and drinks such as: ? Coffee and tea (with or without caffeine). ? Drinks that containalcohol. ? Energy drinks and sports drinks. ? Carbonated drinks or sodas. ?  Chocolate and cocoa. ? Peppermint and mint flavorings. ? Garlic and onions. ? Horseradish. ? Spicy and acidic foods, including peppers, chili powder, curry powder, vinegar, hot sauces, and barbecue sauce. ? Citrus fruit juices and citrus  fruits, such as oranges, lemons, and limes. ? Tomato-based foods, such as red sauce, chili, salsa, and pizza with red sauce. ? Fried and fatty foods, such as donuts, french fries, potato chips, and high-fat dressings. ? High-fat meats, such as hot dogs and fatty cuts of red and white meats, such as rib eye steak, sausage, ham, and bacon. ? High-fat dairy items, such as whole milk, butter, and cream cheese.  Eat small, frequent meals instead of large meals.  Avoid drinking large amounts of liquid with your meals.  Avoid eating meals during the 2-3 hours before bedtime.  Avoid lying down right after you eat.  Do not exercise right after you eat. Lifestyle   Do not use any products that contain nicotine or tobacco, such as cigarettes, e-cigarettes, and chewing tobacco. If you need help quitting, ask your health care provider.  Try to reduce your stress by using methods such as yoga or meditation. If you need help reducing stress, ask your health care provider.  If you are overweight, reduce your weight to an amount that is healthy for you. Ask your health care provider for guidance about a safe weight loss goal. General instructions  Pay attention to any changes in your symptoms.  Take over-the-counter and prescription medicines only as told by your health care provider. Do not take aspirin, ibuprofen, or other NSAIDs unless your health care provider told you to do so.  Wear loose-fitting clothing. Do not wear anything tight around your waist that causes pressure on your abdomen.  Raise (elevate) the head of your bed about 6 inches (15 cm).  Avoid bending over if this makes your symptoms worse.  Keep all follow-up visits as told by your health care provider. This is important. Contact a health care provider if:  You have: ? New symptoms. ? Unexplained weight loss. ? Difficulty swallowing or it hurts to swallow. ? Wheezing or a persistent cough. ? A hoarse voice.  Your  symptoms do not improve with treatment. Get help right away if you:  Have pain in your arms, neck, jaw, teeth, or back.  Feel sweaty, dizzy, or light-headed.  Have chest pain or shortness of breath.  Vomit and your vomit looks like blood or coffee grounds.  Faint.  Have stool that is bloody or black.  Cannot swallow, drink, or eat. Summary  Gastroesophageal reflux happens when acid from the stomach flows up into the esophagus. GERD is a disease in which the reflux happens often, causes frequent or severe symptoms, or causes problems such as damage to the esophagus.  Treatment for this condition may vary depending on how severe your symptoms are. Your health care provider may recommend diet and lifestyle changes, medicine, or surgery.  Contact a health care provider if you have new or worsening symptoms.  Take over-the-counter and prescription medicines only as told by your health care provider. Do not take aspirin, ibuprofen, or other NSAIDs unless your health care provider told you to do so.  Keep all follow-up visits as told by your health care provider. This is important. This information is not intended to replace advice given to you by your health care provider. Make sure you discuss any questions you have with your health care provider. Document Released: 08/29/2005 Document Revised: 05/28/2018  Document Reviewed: 05/28/2018 Elsevier Patient Education  [facility], Adult, Care After This sheet gives you information about how to care for yourself after your procedure. Your health care provider may also give you more specific instructions. If you have problems or questions, contact your health care provider. What can I expect after the procedure? After the procedure, it is common to have:  A sore throat.  Mild stomach pain or discomfort.  Bloating.  Nausea. Follow these instructions at home:   Follow instructions from your health care provider  about what to eat or drink after your procedure.  Return to your normal activities as told by your health care provider. Ask your health care provider what activities are safe for you.  Take over-the-counter and prescription medicines only as told by your health care provider.  Do not drive for 24 hours if you were given a sedative during your procedure.  Keep all follow-up visits as told by your health care provider. This is important. Contact a health care provider if you have:  A sore throat that lasts longer than one day.  Trouble swallowing. Get help right away if:  You vomit blood or your vomit looks like coffee grounds.  You have: ? A fever. ? Bloody, black, or tarry stools. ? A severe sore throat or you cannot swallow. ? Difficulty breathing. ? Severe pain in your chest or abdomen. Summary  After the procedure, it is common to have a sore throat, mild stomach discomfort, bloating, and nausea.  Do not drive for 24 hours if you were given a sedative during the procedure.  Follow instructions from your health care provider about what to eat or drink after your procedure.  Return to your normal activities as told by your health care provider. This information is not intended to replace advice given to you by your health care provider. Make sure you discuss any questions you have with your health care provider. Document Released: 05/20/2012 Document Revised: 05/13/2018 Document Reviewed: 04/21/2018 Elsevier Patient Education  2020 [employer].  Esophageal Dilatation Esophageal dilatation, also called esophageal dilation, is a procedure to widen or open (dilate) a blocked or narrowed part of the esophagus. The esophagus is the part of the body that moves food and liquid from the mouth to the stomach. You may need this procedure if:  You have a buildup of scar tissue in your esophagus that makes it difficult, painful, or impossible to swallow. This can be caused by  gastroesophageal reflux disease (GERD).  You have cancer of the esophagus.  There is a problem with how food moves through your esophagus. In some cases, you may need this procedure repeated at a later time to dilate the esophagus gradually. Tell a health care provider about:  Any allergies you have.  All medicines you are taking, including vitamins, herbs, eye drops, creams, and over-the-counter medicines.  Any problems you or family members have had with anesthetic medicines.  Any blood disorders you have.  Any surgeries you have had.  Any medical conditions you have.  Any antibiotic medicines you are required to take before dental procedures.  Whether you are pregnant or may be pregnant. What are the risks? Generally, this is a safe procedure. However, problems may occur, including:  Bleeding due to a tear in the lining of the esophagus.  A hole (perforation) in the esophagus. What happens before the procedure?  Follow instructions from your health care provider about eating or drinking restrictions.  Ask your health care provider about changing or stopping your regular medicines. This is especially important if you are taking diabetes medicines or blood thinners.  Plan to have someone take you home from the hospital or clinic.  Plan to have a responsible adult care for you for at least 24 hours after you leave the hospital or clinic. This is important. What happens during the procedure?  You may be given a medicine to help you relax (sedative).  A numbing medicine may be sprayed into the back of your throat, or you may gargle the medicine.  Your health care provider may perform the dilatation using various surgical instruments, such as: ? Simple dilators. This instrument is carefully placed in the esophagus to stretch it. ? Guided wire bougies. This involves using an endoscope to insert a wire into the esophagus. A dilator is passed over this wire to enlarge the  esophagus. Then the wire is removed. ? Balloon dilators. An endoscope with a small balloon at the end is inserted into the esophagus. The balloon is inflated to stretch the esophagus and open it up. The procedure may vary among health care providers and hospitals. What happens after the procedure?  Your blood pressure, heart rate, breathing rate, and blood oxygen level will be monitored until the medicines you were given have worn off.  Your throat may feel slightly sore and numb. This will improve slowly over time.  You will not be allowed to eat or drink until your throat is no longer numb.  When you are able to drink, urinate, and sit on the edge of the bed without nausea or dizziness, you may be able to return home. Follow these instructions at home:  Take over-the-counter and prescription medicines only as told by your health care provider.  Do not drive for 24 hours if you were given a sedative during your procedure.  You should have a responsible adult with you for 24 hours after the procedure.  Follow instructions from your health care provider about any eating or drinking restrictions.  Do not use any products that contain nicotine or tobacco, such as cigarettes and e-cigarettes. If you need help quitting, ask your health care provider.  Keep all follow-up visits as told by your health care provider. This is important. Get help right away if you:  Have a fever.  Have chest pain.  Have pain that is not relieved by medication.  Have trouble breathing.  Have trouble swallowing.  Vomit blood. Summary  Esophageal dilatation, also called esophageal dilation, is a procedure to widen or open (dilate) a blocked or narrowed part of the esophagus.  Plan to have someone take you home from the hospital or clinic.  For this procedure, a numbing medicine may be sprayed into the back of your throat, or you may gargle the medicine.  Do not drive for 24 hours if you were given a  sedative during your procedure. This information is not intended to replace advice given to you by your health care provider. Make sure you discuss any questions you have with your health care provider. Document Released: 01/10/2006 Document Revised: 11/01/2017 Document Reviewed: 09/24/2017 Elsevier Patient Education  2020 [employer].

## 2019-08-03 ENCOUNTER — Encounter (HOSPITAL_COMMUNITY): Payer: Self-pay | Admitting: Internal Medicine

## 2019-08-05 ENCOUNTER — Telehealth (INDEPENDENT_AMBULATORY_CARE_PROVIDER_SITE_OTHER): Payer: Self-pay | Admitting: Internal Medicine

## 2019-08-05 NOTE — Telephone Encounter (Signed)
Patient came by office stated to let Dr Laural Golden know the procedure doesn't seem to have done any good.

## 2019-08-06 NOTE — Telephone Encounter (Signed)
This will further addressed on 0908/2020.

## 2019-08-12 NOTE — Telephone Encounter (Signed)
Per Dr.Rehman the patient will need to have Manometry with Impedance study. Please refer to [facility], patient was called and made aware.

## 2019-08-13 NOTE — Telephone Encounter (Signed)
Request sent to [facility], they will contact patient to schedule

## 2019-08-27 ENCOUNTER — Other Ambulatory Visit: Payer: Self-pay

## 2019-08-27 ENCOUNTER — Other Ambulatory Visit: Payer: Self-pay | Admitting: Orthopedic Surgery

## 2019-08-27 ENCOUNTER — Encounter (HOSPITAL_BASED_OUTPATIENT_CLINIC_OR_DEPARTMENT_OTHER): Payer: Self-pay

## 2019-08-27 NOTE — Progress Notes (Signed)
Chart reviewed with Dr Royce Macadamia. Ok to proceed with surgery as scheduled.

## 2019-08-28 ENCOUNTER — Other Ambulatory Visit: Payer: Self-pay

## 2019-08-28 ENCOUNTER — Encounter (HOSPITAL_COMMUNITY)
Admission: RE | Admit: 2019-08-28 | Discharge: 2019-08-28 | Disposition: A | Discharge status: Home / Self Care | Payer: Medicaid Other | Source: Ambulatory Visit | Attending: Orthopedic Surgery | Admitting: Orthopedic Surgery

## 2019-08-28 ENCOUNTER — Other Ambulatory Visit (HOSPITAL_COMMUNITY)
Admission: RE | Admit: 2019-08-28 | Discharge: 2019-08-28 | Disposition: A | Discharge status: Home / Self Care | Payer: Medicaid Other | Source: Ambulatory Visit | Attending: Orthopedic Surgery | Admitting: Orthopedic Surgery

## 2019-08-28 DIAGNOSIS — Z01812 Encounter for preprocedural laboratory examination: Secondary | ICD-10-CM | POA: Diagnosis not present

## 2019-08-28 DIAGNOSIS — Z20828 Contact with and (suspected) exposure to other viral communicable diseases: Secondary | ICD-10-CM | POA: Insufficient documentation

## 2019-08-28 LAB — SARS CORONAVIRUS 2 (TAT 6-24 HRS): SARS Coronavirus 2: NEGATIVE

## 2019-09-01 ENCOUNTER — Ambulatory Visit (HOSPITAL_BASED_OUTPATIENT_CLINIC_OR_DEPARTMENT_OTHER): Payer: Medicaid Other | Admitting: Certified Registered"

## 2019-09-01 ENCOUNTER — Other Ambulatory Visit: Payer: Self-pay

## 2019-09-01 ENCOUNTER — Ambulatory Visit (HOSPITAL_BASED_OUTPATIENT_CLINIC_OR_DEPARTMENT_OTHER)
Admission: RE | Admit: 2019-09-01 | Discharge: 2019-09-01 | Disposition: A | Discharge status: Home / Self Care | Payer: Medicaid Other | Attending: Orthopedic Surgery | Admitting: Orthopedic Surgery

## 2019-09-01 ENCOUNTER — Encounter (HOSPITAL_BASED_OUTPATIENT_CLINIC_OR_DEPARTMENT_OTHER): Payer: Self-pay

## 2019-09-01 ENCOUNTER — Encounter (HOSPITAL_BASED_OUTPATIENT_CLINIC_OR_DEPARTMENT_OTHER)
Admission: RE | Disposition: A | Discharge status: Home / Self Care | Payer: Self-pay | Source: Home / Self Care | Attending: Orthopedic Surgery

## 2019-09-01 DIAGNOSIS — E785 Hyperlipidemia, unspecified: Secondary | ICD-10-CM | POA: Insufficient documentation

## 2019-09-01 DIAGNOSIS — Z79899 Other long term (current) drug therapy: Secondary | ICD-10-CM | POA: Insufficient documentation

## 2019-09-01 DIAGNOSIS — R351 Nocturia: Secondary | ICD-10-CM | POA: Insufficient documentation

## 2019-09-01 DIAGNOSIS — Z981 Arthrodesis status: Secondary | ICD-10-CM | POA: Diagnosis not present

## 2019-09-01 DIAGNOSIS — R1310 Dysphagia, unspecified: Secondary | ICD-10-CM | POA: Diagnosis not present

## 2019-09-01 DIAGNOSIS — M67912 Unspecified disorder of synovium and tendon, left shoulder: Secondary | ICD-10-CM | POA: Diagnosis not present

## 2019-09-01 DIAGNOSIS — R0600 Dyspnea, unspecified: Secondary | ICD-10-CM | POA: Diagnosis not present

## 2019-09-01 DIAGNOSIS — M75112 Incomplete rotator cuff tear or rupture of left shoulder, not specified as traumatic: Secondary | ICD-10-CM | POA: Diagnosis not present

## 2019-09-01 DIAGNOSIS — N401 Enlarged prostate with lower urinary tract symptoms: Secondary | ICD-10-CM | POA: Insufficient documentation

## 2019-09-01 DIAGNOSIS — K210 Gastro-esophageal reflux disease with esophagitis: Secondary | ICD-10-CM | POA: Insufficient documentation

## 2019-09-01 DIAGNOSIS — R51 Headache: Secondary | ICD-10-CM | POA: Insufficient documentation

## 2019-09-01 DIAGNOSIS — M7542 Impingement syndrome of left shoulder: Secondary | ICD-10-CM | POA: Diagnosis not present

## 2019-09-01 DIAGNOSIS — Z87891 Personal history of nicotine dependence: Secondary | ICD-10-CM | POA: Insufficient documentation

## 2019-09-01 DIAGNOSIS — I10 Essential (primary) hypertension: Secondary | ICD-10-CM | POA: Diagnosis not present

## 2019-09-01 DIAGNOSIS — M1990 Unspecified osteoarthritis, unspecified site: Secondary | ICD-10-CM | POA: Diagnosis not present

## 2019-09-01 DIAGNOSIS — F419 Anxiety disorder, unspecified: Secondary | ICD-10-CM | POA: Insufficient documentation

## 2019-09-01 HISTORY — PX: SHOULDER ARTHROSCOPY WITH ROTATOR CUFF REPAIR: SHX5685

## 2019-09-01 HISTORY — PX: SHOULDER ARTHROSCOPY WITH SUBACROMIAL DECOMPRESSION: SHX5684

## 2019-09-01 SURGERY — ARTHROSCOPY, SHOULDER, WITH ROTATOR CUFF REPAIR
Anesthesia: General | Site: Shoulder | Laterality: Left

## 2019-09-01 MED ORDER — CHLORHEXIDINE GLUCONATE 4 % EX LIQD
60.0000 mL | Freq: Once | CUTANEOUS | Status: AC
  Administered 2019-09-01: 4 via TOPICAL

## 2019-09-01 MED ORDER — MIDAZOLAM HCL 2 MG/2ML IJ SOLN
INTRAMUSCULAR | Status: AC
  Filled 2019-09-01: qty 2

## 2019-09-01 MED ORDER — OXYCODONE HCL 5 MG PO TABS: 5.0000 mg | ORAL_TABLET | Freq: Once | ORAL | Status: DC | PRN

## 2019-09-01 MED ORDER — LIDOCAINE HCL (CARDIAC) PF 100 MG/5ML IV SOSY
PREFILLED_SYRINGE | INTRAVENOUS | Status: DC | PRN
  Administered 2019-09-01: 60 mg via INTRAVENOUS

## 2019-09-01 MED ORDER — BUPIVACAINE LIPOSOME 1.3 % IJ SUSP
INTRAMUSCULAR | Status: DC | PRN
  Administered 2019-09-01: 10 mL via PERINEURAL

## 2019-09-01 MED ORDER — MIDAZOLAM HCL 2 MG/2ML IJ SOLN
1.0000 mg | INTRAMUSCULAR | Status: DC | PRN
  Administered 2019-09-01: 1 mg via INTRAVENOUS

## 2019-09-01 MED ORDER — FENTANYL CITRATE (PF) 100 MCG/2ML IJ SOLN
50.0000 ug | INTRAMUSCULAR | Status: DC | PRN
  Administered 2019-09-01: 50 ug via INTRAVENOUS

## 2019-09-01 MED ORDER — OXYCODONE-ACETAMINOPHEN 5-325 MG PO TABS: ORAL_TABLET | ORAL | 0 refills | Status: DC

## 2019-09-01 MED ORDER — DEXAMETHASONE SODIUM PHOSPHATE 4 MG/ML IJ SOLN
INTRAMUSCULAR | Status: DC | PRN
  Administered 2019-09-01: 5 mg via INTRAVENOUS

## 2019-09-01 MED ORDER — LACTATED RINGERS IV SOLN
INTRAVENOUS | Status: DC
  Administered 2019-09-01: 13:00:00 via INTRAVENOUS

## 2019-09-01 MED ORDER — PROPOFOL 10 MG/ML IV BOLUS
INTRAVENOUS | Status: AC
  Filled 2019-09-01: qty 20

## 2019-09-01 MED ORDER — PHENYLEPHRINE HCL (PRESSORS) 10 MG/ML IV SOLN
INTRAVENOUS | Status: DC | PRN
  Administered 2019-09-01 (×2): 40 ug via INTRAVENOUS

## 2019-09-01 MED ORDER — SODIUM CHLORIDE 0.9 % IR SOLN
Status: DC | PRN
  Administered 2019-09-01: 1

## 2019-09-01 MED ORDER — CEFAZOLIN SODIUM-DEXTROSE 2-4 GM/100ML-% IV SOLN
INTRAVENOUS | Status: AC
  Filled 2019-09-01: qty 100

## 2019-09-01 MED ORDER — CEFAZOLIN SODIUM-DEXTROSE 2-4 GM/100ML-% IV SOLN
2.0000 g | INTRAVENOUS | Status: AC
  Administered 2019-09-01: 2 g via INTRAVENOUS

## 2019-09-01 MED ORDER — TIZANIDINE HCL 4 MG PO TABS: 4.0000 mg | ORAL_TABLET | Freq: Three times a day (TID) | ORAL | 1 refills | Status: DC | PRN

## 2019-09-01 MED ORDER — FENTANYL CITRATE (PF) 100 MCG/2ML IJ SOLN: 25.0000 ug | INTRAMUSCULAR | Status: DC | PRN

## 2019-09-01 MED ORDER — SUCCINYLCHOLINE CHLORIDE 20 MG/ML IJ SOLN
INTRAMUSCULAR | Status: DC | PRN
  Administered 2019-09-01: 120 mg via INTRAVENOUS

## 2019-09-01 MED ORDER — OXYCODONE HCL 5 MG/5ML PO SOLN: 5.0000 mg | Freq: Once | ORAL | Status: DC | PRN

## 2019-09-01 MED ORDER — BUPIVACAINE HCL (PF) 0.5 % IJ SOLN
INTRAMUSCULAR | Status: DC | PRN
  Administered 2019-09-01: 15 mL via PERINEURAL

## 2019-09-01 MED ORDER — PROPOFOL 10 MG/ML IV BOLUS
INTRAVENOUS | Status: DC | PRN
  Administered 2019-09-01: 200 mg via INTRAVENOUS

## 2019-09-01 MED ORDER — PROMETHAZINE HCL 25 MG/ML IJ SOLN: 6.2500 mg | INTRAMUSCULAR | Status: DC | PRN

## 2019-09-01 MED ORDER — SODIUM CHLORIDE 0.9 % IV SOLN
INTRAVENOUS | Status: DC | PRN
  Administered 2019-09-01: 40 ug/min via INTRAVENOUS

## 2019-09-01 MED ORDER — EPHEDRINE SULFATE 50 MG/ML IJ SOLN
INTRAMUSCULAR | Status: DC | PRN
  Administered 2019-09-01: 10 mg via INTRAVENOUS

## 2019-09-01 MED ORDER — FENTANYL CITRATE (PF) 100 MCG/2ML IJ SOLN
INTRAMUSCULAR | Status: AC
  Filled 2019-09-01: qty 2

## 2019-09-01 SURGICAL SUPPLY — 83 items
ANCHOR PEEK 4.75X19.1 SWLK C (Anchor) ×2 IMPLANT
BENZOIN TINCTURE PRP APPL 2/3 (GAUZE/BANDAGES/DRESSINGS) IMPLANT
BLADE CLIPPER SURG (BLADE) IMPLANT
BLADE SURG 15 STRL LF DISP TIS (BLADE) IMPLANT
BLADE SURG 15 STRL SS (BLADE)
BURR OVAL 8 FLU 4.0X13 (MISCELLANEOUS) ×2 IMPLANT
CANNULA 5.75X7 CRYSTAL CLEAR (CANNULA) ×2 IMPLANT
CANNULA 5.75X71 LONG (CANNULA) ×2 IMPLANT
CANNULA TWIST IN 8.25X7CM (CANNULA) ×2 IMPLANT
CHLORAPREP W/TINT 26 (MISCELLANEOUS) ×2 IMPLANT
COVER WAND RF STERILE (DRAPES) IMPLANT
CUTTER BONE 4.0MM X 13CM (MISCELLANEOUS) ×2 IMPLANT
DECANTER SPIKE VIAL GLASS SM (MISCELLANEOUS) IMPLANT
DERMABOND ADVANCED (GAUZE/BANDAGES/DRESSINGS)
DERMABOND ADVANCED .7 DNX12 (GAUZE/BANDAGES/DRESSINGS) IMPLANT
DRAPE HALF SHEET 70X43 (DRAPES) IMPLANT
DRAPE IMP U-DRAPE 54X76 (DRAPES) ×2 IMPLANT
DRAPE INCISE IOBAN 66X45 STRL (DRAPES) ×2 IMPLANT
DRAPE STERI 35X30 U-POUCH (DRAPES) ×2 IMPLANT
DRAPE SURG 17X23 STRL (DRAPES) ×2 IMPLANT
DRAPE U-SHAPE 47X51 STRL (DRAPES) ×2 IMPLANT
DRAPE U-SHAPE 76X120 STRL (DRAPES) ×4 IMPLANT
DRSG PAD ABDOMINAL 8X10 ST (GAUZE/BANDAGES/DRESSINGS) ×2 IMPLANT
ELECT REM PT RETURN 9FT ADLT (ELECTROSURGICAL) ×2
ELECTRODE REM PT RTRN 9FT ADLT (ELECTROSURGICAL) ×1 IMPLANT
GAUZE 4X4 16PLY RFD (DISPOSABLE) IMPLANT
GAUZE SPONGE 4X4 12PLY STRL (GAUZE/BANDAGES/DRESSINGS) ×2 IMPLANT
GAUZE XEROFORM 1X8 LF (GAUZE/BANDAGES/DRESSINGS) ×2 IMPLANT
GLOVE BIO SURGEON STRL SZ 6.5 (GLOVE) ×4 IMPLANT
GLOVE BIO SURGEON STRL SZ7 (GLOVE) ×2 IMPLANT
GLOVE BIO SURGEON STRL SZ7.5 (GLOVE) ×2 IMPLANT
GLOVE BIOGEL PI IND STRL 7.0 (GLOVE) ×3 IMPLANT
GLOVE BIOGEL PI IND STRL 8 (GLOVE) ×1 IMPLANT
GLOVE BIOGEL PI INDICATOR 7.0 (GLOVE) ×3
GLOVE BIOGEL PI INDICATOR 8 (GLOVE) ×1
GOWN STRL REUS W/ TWL LRG LVL3 (GOWN DISPOSABLE) ×3 IMPLANT
GOWN STRL REUS W/ TWL XL LVL3 (GOWN DISPOSABLE) ×1 IMPLANT
GOWN STRL REUS W/TWL LRG LVL3 (GOWN DISPOSABLE) ×3
GOWN STRL REUS W/TWL XL LVL3 (GOWN DISPOSABLE) ×1
LASSO CRESCENT QUICKPASS (SUTURE) IMPLANT
MANIFOLD NEPTUNE II (INSTRUMENTS) ×2 IMPLANT
NDL SUT 6 .5 CRC .975X.05 MAYO (NEEDLE) IMPLANT
NEEDLE 1/2 CIR CATGUT .05X1.09 (NEEDLE) IMPLANT
NEEDLE MAYO TAPER (NEEDLE)
NEEDLE SCORPION MULTI FIRE (NEEDLE) ×2 IMPLANT
NS IRRIG 1000ML POUR BTL (IV SOLUTION) IMPLANT
PACK ARTHROSCOPY DSU (CUSTOM PROCEDURE TRAY) ×2 IMPLANT
PACK BASIN DAY SURGERY FS (CUSTOM PROCEDURE TRAY) ×2 IMPLANT
PENCIL BUTTON HOLSTER BLD 10FT (ELECTRODE) IMPLANT
PROBE BIPOLAR ATHRO 135MM 90D (MISCELLANEOUS) ×2 IMPLANT
RESTRAINT HEAD UNIVERSAL NS (MISCELLANEOUS) ×2 IMPLANT
SLEEVE SCD COMPRESS KNEE MED (MISCELLANEOUS) ×2 IMPLANT
SLING ARM FOAM STRAP LRG (SOFTGOODS) ×2 IMPLANT
SPONGE LAP 4X18 RFD (DISPOSABLE) IMPLANT
STRIP CLOSURE SKIN 1/2X4 (GAUZE/BANDAGES/DRESSINGS) IMPLANT
SUCTION FRAZIER HANDLE 10FR (MISCELLANEOUS)
SUCTION TUBE FRAZIER 10FR DISP (MISCELLANEOUS) IMPLANT
SUPPORT WRAP ARM LG (MISCELLANEOUS) IMPLANT
SUT BONE WAX W31G (SUTURE) IMPLANT
SUT ETHIBOND 2 OS 4 DA (SUTURE) IMPLANT
SUT ETHILON 3 0 PS 1 (SUTURE) ×2 IMPLANT
SUT ETHILON 4 0 PS 2 18 (SUTURE) IMPLANT
SUT FIBERWIRE #2 38 T-5 BLUE (SUTURE)
SUT FIBERWIRE 2-0 18 17.9 3/8 (SUTURE)
SUT MNCRL AB 3-0 PS2 18 (SUTURE) IMPLANT
SUT MNCRL AB 4-0 PS2 18 (SUTURE) IMPLANT
SUT PDS AB 0 CT 36 (SUTURE) IMPLANT
SUT PROLENE 3 0 PS 2 (SUTURE) IMPLANT
SUT TIGER TAPE 7 IN WHITE (SUTURE) IMPLANT
SUT VIC AB 0 CT1 27 (SUTURE)
SUT VIC AB 0 CT1 27XBRD ANBCTR (SUTURE) IMPLANT
SUT VIC AB 2-0 SH 27 (SUTURE)
SUT VIC AB 2-0 SH 27XBRD (SUTURE) IMPLANT
SUTURE FIBERWR #2 38 T-5 BLUE (SUTURE) IMPLANT
SUTURE FIBERWR 2-0 18 17.9 3/8 (SUTURE) IMPLANT
SUTURE TAPE 1.3 40 TPR END (SUTURE) ×2 IMPLANT
SUTURETAPE 1.3 40 TPR END (SUTURE) ×4
SYR BULB 3OZ (MISCELLANEOUS) IMPLANT
TAPE FIBER 2MM 7IN #2 BLUE (SUTURE) ×2 IMPLANT
TOWEL GREEN STERILE FF (TOWEL DISPOSABLE) ×2 IMPLANT
TUBE CONNECTING 20X1/4 (TUBING) ×4 IMPLANT
TUBING ARTHROSCOPY IRRIG 16FT (MISCELLANEOUS) ×2 IMPLANT
WATER STERILE IRR 1000ML POUR (IV SOLUTION) ×2 IMPLANT

## 2019-09-01 NOTE — Anesthesia Postprocedure Evaluation (Signed)
Anesthesia Post Note  Patient: ELOHIM VERMETTE  Procedure(s) Performed: ARTHROSCOPY SHOULDER ROTATOR CUFF REPAIR (Left Shoulder) SHOULDER ARTHROSCOPY WITH SUBACROMIAL DECOMPRESSION (Left Shoulder)     Patient location during evaluation: PACU Anesthesia Type: General Level of consciousness: awake and alert Pain management: pain level controlled Vital Signs Assessment: post-procedure vital signs reviewed and stable Respiratory status: spontaneous breathing, nonlabored ventilation and respiratory function stable Cardiovascular status: blood pressure returned to baseline and stable Postop Assessment: no apparent nausea or vomiting Anesthetic complications: no    Last Vitals:  Vitals:   09/01/19 1519 09/01/19 1545  BP: (!) 141/93 (!) 155/94  Pulse: 79 82  Resp: 19 18  Temp:  36.5 C  SpO2:  99%    Last Pain:  Vitals:   09/01/19 1545  TempSrc:   PainSc: 0-No pain                 Audry Pili

## 2019-09-01 NOTE — Transfer of Care (Signed)
Immediate Anesthesia Transfer of Care Note  Patient: Xavier Livingston  Procedure(s) Performed: ARTHROSCOPY SHOULDER ROTATOR CUFF REPAIR (Left Shoulder) SHOULDER ARTHROSCOPY WITH SUBACROMIAL DECOMPRESSION (Left Shoulder)  Patient Location: PACU  Anesthesia Type:GA combined with regional for post-op pain  Level of Consciousness: awake, alert  and oriented  Airway & Oxygen Therapy: Patient Spontanous Breathing and Patient connected to face mask oxygen  Post-op Assessment: Report given to RN and Post -op Vital signs reviewed and stable  Post vital signs: Reviewed and stable  Last Vitals:  Vitals Value Taken Time  BP    Temp    Pulse    Resp    SpO2      Last Pain:  Vitals:   09/01/19 1057  TempSrc: Oral  PainSc: 7       Patients Stated Pain Goal: 5 ([id number])  Complications: No apparent anesthesia complications

## 2019-09-01 NOTE — Discharge Instructions (Signed)
Discharge Instructions after Arthroscopic Shoulder Repair   A sling has been provided for you. Remain in your sling at all times. This includes sleeping in your sling.  Use ice on the shoulder intermittently over the first 48 hours after surgery.  Pain medicine has been prescribed for you.  Use your medicine liberally over the first 48 hours, and then you can begin to taper your use. You may take Extra Strength Tylenol or Tylenol only in place of the pain pills. DO NOT take ANY nonsteroidal anti-inflammatory pain medications: Advil, Motrin, Ibuprofen, Aleve, Naproxen, or Narprosyn.  You may remove your dressing after two days. If the incision sites are still moist, place a Band-Aid over the moist site(s). Change Band-Aids daily until dry.  You may shower 5 days after surgery. The incisions CANNOT get wet prior to 5 days. Simply allow the water to wash over the site and then pat dry. Do not rub the incisions. Make sure your axilla (armpit) is completely dry after showering.  Take one aspirin a day for 2 weeks after surgery, unless you have an aspirin sensitivity/ allergy or asthma.   Please call [phone] during normal business hours or [phone] after hours for any problems. Including the following:  - excessive redness of the incisions - drainage for more than 4 days - fever of more than 101.5 F  *Please note that pain medications will not be refilled after hours or on weekends.   Post Anesthesia Home Care Instructions  Activity: Get plenty of rest for the remainder of the day. A responsible individual must stay with you for 24 hours following the procedure.  For the next 24 hours, DO NOT: -Drive a car -Operate machinery -Drink alcoholic beverages -Take any medication unless instructed by your physician -Make any legal decisions or sign important papers.  Meals: Start with liquid foods such as gelatin or soup. Progress to regular foods as tolerated. Avoid greasy, spicy, heavy  foods. If nausea and/or vomiting occur, drink only clear liquids until the nausea and/or vomiting subsides. Call your physician if vomiting continues.  Special Instructions/Symptoms: Your throat may feel dry or sore from the anesthesia or the breathing tube placed in your throat during surgery. If this causes discomfort, gargle with warm salt water. The discomfort should disappear within 24 hours.  If you had a scopolamine patch placed behind your ear for the management of post- operative nausea and/or vomiting:  1. The medication in the patch is effective for 72 hours, after which it should be removed.  Wrap patch in a tissue and discard in the trash. Wash hands thoroughly with soap and water. 2. You may remove the patch earlier than 72 hours if you experience unpleasant side effects which may include dry mouth, dizziness or visual disturbances. 3. Avoid touching the patch. Wash your hands with soap and water after contact with the patch.     Regional Anesthesia Blocks  1. Numbness or the inability to move the "blocked" extremity may last from 3-48 hours after placement. The length of time depends on the medication injected and your individual response to the medication. If the numbness is not going away after 48 hours, call your surgeon.  2. The extremity that is blocked will need to be protected until the numbness is gone and the  Strength has returned. Because you cannot feel it, you will need to take extra care to avoid injury. Because it may be weak, you may have difficulty moving it or using it. You may   not know what position it is in without looking at it while the block is in effect.  3. For blocks in the legs and feet, returning to weight bearing and walking needs to be done carefully. You will need to wait until the numbness is entirely gone and the strength has returned. You should be able to move your leg and foot normally before you try and bear weight or walk. You will need someone  to be with you when you first try to ensure you do not fall and possibly risk injury.  4. Bruising and tenderness at the needle site are common side effects and will resolve in a few days.  5. Persistent numbness or new problems with movement should be communicated to the surgeon or the [facility] ([phone])/ [facility] ([phone]).  Information for Discharge Teaching: EXPAREL (bupivacaine liposome injectable suspension)   Your surgeon or anesthesiologist gave you EXPAREL(bupivacaine) to help control your pain after surgery.   EXPAREL is a local anesthetic that provides pain relief by numbing the tissue around the surgical site.  EXPAREL is designed to release pain medication over time and can control pain for up to 72 hours.  Depending on how you respond to EXPAREL, you may require less pain medication during your recovery.  Possible side effects:  Temporary loss of sensation or ability to move in the area where bupivacaine was injected.  Nausea, vomiting, constipation  Rarely, numbness and tingling in your mouth or lips, lightheadedness, or anxiety may occur.  Call your doctor right away if you think you may be experiencing any of these sensations, or if you have other questions regarding possible side effects.  Follow all other discharge instructions given to you by your surgeon or nurse. Eat a healthy diet and drink plenty of water or other fluids.  If you return to the hospital for any reason within 96 hours following the administration of EXPAREL, it is important for health care providers to know that you have received this anesthetic. A teal colored band has been placed on your arm with the date, time and amount of EXPAREL you have received in order to alert and inform your health care providers. Please leave this armband in place for the full 96 hours following administration, and then you may remove the band.    

## 2019-09-01 NOTE — Anesthesia Procedure Notes (Signed)
Anesthesia Regional Block: Interscalene brachial plexus block   Pre-Anesthetic Checklist: ,, timeout performed, Correct Patient, Correct Site, Correct Laterality, Correct Procedure, Correct Position, site marked, Risks and benefits discussed,  Surgical consent,  Pre-op evaluation,  At surgeon's request and post-op pain management  Laterality: Left  Prep: chloraprep       Needles:  Injection technique: Single-shot  Needle Type: Echogenic Needle     Needle Length: 5cm  Needle Gauge: 21     Additional Needles:   Narrative:  Start time: 09/01/2019 12:28 PM End time: 09/01/2019 12:32 PM Injection made incrementally with aspirations every 5 mL.  Performed by: Personally  Anesthesiologist: Audry Pili, MD  Additional Notes: No pain on injection. No increased resistance to injection. Injection made in 5cc increments. Good needle visualization. Patient tolerated the procedure well.

## 2019-09-01 NOTE — Anesthesia Preprocedure Evaluation (Addendum)
Anesthesia Evaluation  Patient identified by MRN, date of birth, ID band Patient awake    Reviewed: Allergy & Precautions, NPO status , Patient's Chart, lab work & pertinent test results  History of Anesthesia Complications Negative for: history of anesthetic complications  Airway Mallampati: II  TM Distance: >3 FB Neck ROM: Limited    Dental  (+) Edentulous Upper, Edentulous Lower   Pulmonary Patient abstained from smoking., former smoker,    Pulmonary exam normal        Cardiovascular hypertension, Pt. on medications Normal cardiovascular exam     Neuro/Psych  Headaches, Seizures -, Well Controlled,  PSYCHIATRIC DISORDERS Anxiety  S/p ACDF and posterior cervical fusions     GI/Hepatic GERD  Medicated and Poorly Controlled,(+)     substance abuse  marijuana use,   Endo/Other  negative endocrine ROS  Renal/GU negative Renal ROS     Musculoskeletal  (+) Arthritis ,   Abdominal   Peds  Hematology negative hematology ROS (+)   Anesthesia Other Findings   Reproductive/Obstetrics                            Anesthesia Physical Anesthesia Plan  ASA: II  Anesthesia Plan: General   Post-op Pain Management:  Regional for Post-op pain   Induction: Intravenous and Rapid sequence  PONV Risk Score and Plan: 2 and Treatment may vary due to age or medical condition, Ondansetron and Dexamethasone  Airway Management Planned: Oral ETT  Additional Equipment: None  Intra-op Plan:   Post-operative Plan: Extubation in OR  Informed Consent: I have reviewed the patients History and Physical, chart, labs and discussed the procedure including the risks, benefits and alternatives for the proposed anesthesia with the patient or authorized representative who has indicated his/her understanding and acceptance.     Dental advisory given  Plan Discussed with: CRNA and Anesthesiologist  Anesthesia  Plan Comments:        Anesthesia Quick Evaluation

## 2019-09-01 NOTE — Anesthesia Procedure Notes (Signed)
Procedure Name: Intubation Date/Time: 09/01/2019 1:59 PM Performed by: Willa Frater, CRNA Pre-anesthesia Checklist: Patient identified, Emergency Drugs available, Suction available and Patient being monitored Patient Re-evaluated:Patient Re-evaluated prior to induction Oxygen Delivery Method: Circle system utilized Preoxygenation: Pre-oxygenation with 100% oxygen Induction Type: IV induction, Cricoid Pressure applied and Rapid sequence Ventilation: Mask ventilation without difficulty Laryngoscope Size: Mac and 3 Grade View: Grade I Tube type: Oral Number of attempts: 1 Airway Equipment and Method: Stylet and Oral airway Placement Confirmation: ETT inserted through vocal cords under direct vision,  positive ETCO2 and breath sounds checked- equal and bilateral Secured at: 22 cm Tube secured with: Tape Dental Injury: Teeth and Oropharynx as per pre-operative assessment

## 2019-09-01 NOTE — Progress Notes (Signed)
Assisted Dr. Fransisco Beau with left, ultrasound guided, interscalene  block. Side rails up, monitors on throughout procedure. See vital signs in flow sheet. Tolerated Procedure well.

## 2019-09-01 NOTE — H&P (Signed)
Xavier Livingston is an 63 y.o. male.   Chief Complaint: L shoulder pain and dysfunction HPI: Long history of L shoulder pain with partial thickness rotator cuff tear.  Failed conservative measures.  Past Medical History:  Diagnosis Date  . Anxiety   . Arthritis   . Complication of anesthesia    diff swallowing,reflx  . Dyspnea   . Dysrhythmia    irregular heartbeat in the 80's  . Enlarged prostate   . GERD (gastroesophageal reflux disease)   . Headache   . Hyperlipemia   . Hypertension   . Nocturia more than twice per night   . Reflux esophagitis   . Seizures ([place])    last at age 74 or 63 years old  . Smoker within last 12 months   . Swallowing difficulty     Past Surgical History:  Procedure Laterality Date  . ANTERIOR CERVICAL DECOMPRESSION/DISCECTOMY FUSION 4 LEVELS N/A 04/11/2015   Procedure: Anterior Cervical Discectomy/ Decompression Fusion Cervical three-four, Cervical four-five, Cervical five-six, Cervical six- seven;  Surgeon: Kary Kos, MD;  Location: [facility];  Service: Neurosurgery;  Laterality: N/A;  . BACK SURGERY    . COLONOSCOPY N/A 08/18/2014   Procedure: COLONOSCOPY;  Surgeon: Rogene Houston, MD;  [facility];  Service: Endoscopy;  Laterality: N/A;  1030-moved to 1200 Ann to notify pt  . ESOPHAGEAL DILATION N/A 11/29/2016   Procedure: ESOPHAGEAL DILATION;  Surgeon: Rogene Houston, MD;  [facility];  Service: Endoscopy;  Laterality: N/A;  . ESOPHAGEAL DILATION N/A 05/16/2017   Procedure: ESOPHAGEAL DILATION;  Surgeon: Rogene Houston, MD;  [facility];  Service: Endoscopy;  Laterality: N/A;  . ESOPHAGEAL DILATION N/A 07/29/2019   Procedure: ESOPHAGEAL DILATION;  Surgeon: Rogene Houston, MD;  [facility];  Service: Endoscopy;  Laterality: N/A;  . ESOPHAGOGASTRODUODENOSCOPY N/A 08/18/2014   Procedure: ESOPHAGOGASTRODUODENOSCOPY (EGD);  Surgeon: Rogene Houston, MD;  [facility];  Service: Endoscopy;   Laterality: N/A;  . ESOPHAGOGASTRODUODENOSCOPY N/A 11/29/2016   Procedure: ESOPHAGOGASTRODUODENOSCOPY (EGD);  Surgeon: Rogene Houston, MD;  [facility];  Service: Endoscopy;  Laterality: N/A;  12:00  . ESOPHAGOGASTRODUODENOSCOPY N/A 07/29/2019   Procedure: ESOPHAGOGASTRODUODENOSCOPY (EGD);  Surgeon: Rogene Houston, MD;  [facility];  Service: Endoscopy;  Laterality: N/A;  2:00  . ESOPHAGOGASTRODUODENOSCOPY (EGD) WITH PROPOFOL N/A 05/16/2017   Procedure: ESOPHAGOGASTRODUODENOSCOPY (EGD) WITH PROPOFOL;  Surgeon: Rogene Houston, MD;  [facility];  Service: Endoscopy;  Laterality: N/A;  8:30  . excision of mass of back    . MALONEY DILATION  08/18/2014   Procedure: MALONEY DILATION;  Surgeon: Rogene Houston, MD;  [facility];  Service: Endoscopy;;  . MASS EXCISION N/A 10/21/2013   Procedure: EXCISION MASSES X 2 ON BACK;  Surgeon: Scherry Ran, MD;  Location: [facility];  Service: General;  Laterality: N/A;  . NASAL SEPTUM SURGERY    . POSTERIOR CERVICAL FUSION/FORAMINOTOMY N/A 02/05/2018   Procedure: Posterior Cervical Fusion with lateral mass fixation - Cervical six-seven;  Surgeon: Kary Kos, MD;  Location: [facility];  Service: Neurosurgery;  Laterality: N/A;  . POSTERIOR CERVICAL FUSION/FORAMINOTOMY N/A 02/06/2019   Procedure: Posterior Cervical Fusion with lateral mass fixation redo C6-T1;  Surgeon: Kary Kos, MD;  Location: [employer];  Service: Neurosurgery;  Laterality: N/A;    History reviewed. No pertinent family history. Social History:  reports that he quit smoking about 19 months ago. His  smoking use included cigarettes and cigars. He has a 10.00 pack-year smoking history. He has never used smokeless tobacco. He reports current alcohol use of about 6.0 standard drinks of alcohol per week. He reports current drug use. Drug: Marijuana.  Allergies: No Known Allergies  Medications Prior to Admission  Medication Sig Dispense Refill  . amLODipine  (NORVASC) 5 MG tablet Take 5 mg by mouth daily.     [name] esomeprazole (NEXIUM) 40 MG packet Take 40 mg by mouth daily before breakfast. 30 each 5  . oxyCODONE-acetaminophen (PERCOCET) 10-325 MG tablet Take 1 tablet by mouth 2 (two) times daily as needed for pain.    . pravastatin (PRAVACHOL) 40 MG tablet Take 40 mg by mouth daily.    . cholecalciferol (VITAMIN D3) 25 MCG (1000 UT) tablet Take 1,000 Units by mouth daily.      No results found for this or any previous visit (from the past 48 hour(s)). No results found.  Review of Systems  All other systems reviewed and are negative.   Blood pressure (!) 143/95, pulse 66, temperature 97.8 F (36.6 C), temperature source Oral, resp. rate 13, height 5\' 11"  (1.803 m), weight 72 kg, SpO2 100 %. Physical Exam  Constitutional: He is oriented to person, place, and time. He appears well-developed and well-nourished.  HENT:  Head: Atraumatic.  Eyes: EOM are normal.  Cardiovascular: Intact distal pulses.  Respiratory: Effort normal.  Musculoskeletal:     Comments: L shoulder pain with RC testing. NVID  Neurological: He is alert and oriented to person, place, and time.  Skin: Skin is warm and dry.  Psychiatric: He has a normal mood and affect.     Assessment/Plan Long history of L shoulder pain with partial thickness rotator cuff tear.  Failed conservative measures.  Plan L arth RCR vs RCD, SAD Risks / benefits of surgery discussed Consent on chart  NPO for OR Preop antibiotics   Isabella Stalling, MD 09/01/2019, 1:42 PM

## 2019-09-01 NOTE — Op Note (Signed)
Procedure(s): ARTHROSCOPY SHOULDER ROTATOR CUFF REPAIR SHOULDER ARTHROSCOPY WITH SUBACROMIAL DECOMPRESSION Procedure Note  JAHKYE APA male 63 y.o. 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  Procedure(s) and Anesthesia Type: #1 left shoulder arthroscopic rotator cuff repair #2 left shoulder arthroscopic subacromial decompression #3 left shoulder proximal long head biceps tenotomy   Surgeon(s) and Role:    Tania Ade, MD - Primary     Surgeon: Isabella Stalling   Assistants: Jeanmarie Hubert PA-C (Danielle was present and scrubbed throughout the procedure and was essential in positioning, assisting with the camera and instrumentation,, and closure)  Anesthesia: General endotracheal anesthesia with preoperative interscalene block given by the attending anesthesiologist    Procedure Detail  Estimated Blood Loss: Min         Drains: none  Blood Given: none         Specimens: none        Complications:  * No complications entered in OR log *         Disposition: PACU - hemodynamically stable.         Condition: stable    Procedure:   INDICATIONS FOR SURGERY: The patient is 63 y.o. male who has had a long history of left shoulder pain which is failed conservative management.  He was indicated for surgical treatment to try and decrease pain and restore function.  OPERATIVE FINDINGS: Examination under anesthesia: No stiffness or instability   DESCRIPTION OF PROCEDURE: The patient was identified in preoperative  holding area where I personally marked the operative site after  verifying site, side, and procedure with the patient. An interscalene block was given by the attending anesthesiologist the holding area.  The patient was taken  back to the operating room where general anesthesia was induced without complication and was placed in the beach-chair position with the back  elevated about 60 degrees and all extremities and head and neck carefully padded and  positioned.   The left upper extremity was then prepped and  draped in a standard sterile fashion. The appropriate time-out  procedure was carried out. The patient did receive IV antibiotics  within 30 minutes of incision.   A small posterior portal incision was made and the arthroscope was introduced into the joint. An anterior portal was then established above the subscapularis using needle localization. Small cannula was placed anteriorly. Diagnostic arthroscopy was then carried out.  The subscapularis was noted to be completely intact.  He was however noted to have significant partial tearing of the proximal long head biceps tendon involving greater than 50% of the tendon thickness.  This was felt to be a significant pain generator and indicated for biceps tenotomy.  Therefore through a small anterior incision curved Mayo scissors were used to release the tendon from the superior labrum and it was allowed to retract from the joint.  The arthroscope was then introduced into the subacromial space a standard lateral portal was established with needle localization. The shaver was used through the lateral portal to perform extensive bursectomy. Coracoacromial ligament was examined and found to be frayed indicating impingement.  The bursal surface of the rotator cuff was noted to have an anterior penetration with exposed tuberosity.  The hole was quite small however upon probing it went into an area where 75 to 80% of the tuberosity anteriorly  was devoid of tendinous insertion.  Therefore the tear was slightly enlarge and to prepare the tuberosity down to a bleeding bony surface in preparation for repair.  The repair was carried out by placing 2 suture tapes in an inverted  mattress configuration anterior and posterior in the tear and bringing these 4 suture strands over to a 4.75 peek swivel lock anchor in a lateral row position bringing the tendon nicely down over the prepared tuberosity.    The coracoacromial ligament was taken down off the anterior acromion with the ArthroCare exposing a moderate hooked anterior acromial spur. A high-speed bur was then used through the lateral portal to take down the anterior acromial spur from lateral to medial in a standard acromioplasty.  The acromioplasty was also viewed from the lateral portal and the bur was used as necessary to ensure that the acromion was completely flat from posterior to anterior.  The arthroscopic equipment was removed from the joint and the portals were closed with 3-0 nylon in an interrupted fashion. Sterile dressings were then applied including Xeroform 4 x 4's ABDs and tape. The patient was then allowed to awaken from general anesthesia, placed in a sling, transferred to the stretcher and taken to the recovery room in stable condition.   POSTOPERATIVE PLAN: The patient will be discharged home today and will followup in one week for suture removal and wound check.  He will follow the standard cuff protocol.

## 2019-09-02 ENCOUNTER — Encounter (HOSPITAL_BASED_OUTPATIENT_CLINIC_OR_DEPARTMENT_OTHER): Payer: Self-pay | Admitting: Orthopedic Surgery

## 2019-10-13 ENCOUNTER — Other Ambulatory Visit: Payer: Self-pay

## 2019-10-13 DIAGNOSIS — Z20822 Contact with and (suspected) exposure to covid-19: Secondary | ICD-10-CM

## 2019-10-15 LAB — NOVEL CORONAVIRUS, NAA: SARS-CoV-2, NAA: NOT DETECTED

## 2019-10-16 ENCOUNTER — Telehealth: Payer: Self-pay | Admitting: Hematology

## 2019-10-16 NOTE — Telephone Encounter (Signed)
Pt is aware covid 19 results is neg

## 2019-12-31 ENCOUNTER — Telehealth (INDEPENDENT_AMBULATORY_CARE_PROVIDER_SITE_OTHER): Payer: Self-pay | Admitting: Internal Medicine

## 2019-12-31 NOTE — Telephone Encounter (Signed)
Please call Lenell Antu at [facility] ph# [phone] - she has some information to give Dr Laural Golden on this patient

## 2020-01-01 NOTE — Telephone Encounter (Signed)
Talked with Xavier [email or web address]@ [email or web address]. Xavier Livingston presented for the Esophageal Manometry. They could get to 38 cm,met resistance ,  patient got bad per [facility]. Procedure aborted. She questioned if he may have a hernia , or a narrowing.  She may be reached at [phone].

## 2020-01-11 ENCOUNTER — Telehealth (INDEPENDENT_AMBULATORY_CARE_PROVIDER_SITE_OTHER): Payer: Self-pay | Admitting: Internal Medicine

## 2020-01-11 NOTE — Telephone Encounter (Signed)
Patient left voice mail message stating since [facility] had faxed procedure results he would like to know what the next step will be regarding his care - please advise - [phone]

## 2020-01-13 NOTE — Telephone Encounter (Signed)
Forwarded to Dr.Rehman as he plans to call patient this week to discuss plan of care.

## 2020-01-13 NOTE — Telephone Encounter (Signed)
Dr.Rehman was made aware. He states that he will call the patient to discuss alteratives. Patient called and made aware.

## 2020-01-23 ENCOUNTER — Other Ambulatory Visit (INDEPENDENT_AMBULATORY_CARE_PROVIDER_SITE_OTHER): Payer: Self-pay | Admitting: Internal Medicine

## 2020-01-23 MED ORDER — DILTIAZEM HCL 30 MG PO TABS: 30.0000 mg | ORAL_TABLET | Freq: Three times a day (TID) | ORAL | 2 refills | Status: DC

## 2020-01-23 NOTE — Telephone Encounter (Signed)
I talked with patient over the phone. He was not able to undergo manometry study.  He states it would not go down past his esophagus and he started bleeding from his nose. He is able to swallow soft foods but not meats. He tells me that he he has lost close to 70 pounds.  He feels weight loss is due to dysphagia. Will arrange for chest CT with contrast. In the meantime we will treat her with Cardizem 30 mg by mouth 30 minutes before each meal.  Patient is on low-dose amlodipine. Patient advised stop medication if he gets lightheaded or dizzy. Further recommendations to follow.

## 2020-01-25 NOTE — Telephone Encounter (Signed)
Already addressed. Patient is getting chest CT.

## 2020-01-25 NOTE — Telephone Encounter (Signed)
PA is in process for Chest CT, patient aware I will sch'd once PA is obtained

## 2020-01-27 ENCOUNTER — Other Ambulatory Visit (INDEPENDENT_AMBULATORY_CARE_PROVIDER_SITE_OTHER): Payer: Self-pay | Admitting: *Deleted

## 2020-01-27 DIAGNOSIS — R1310 Dysphagia, unspecified: Secondary | ICD-10-CM

## 2020-01-27 DIAGNOSIS — R1319 Other dysphagia: Secondary | ICD-10-CM

## 2020-01-27 NOTE — Telephone Encounter (Signed)
Insurance denied Chest CT - per Dr Laural Golden schedule BPE and we'll go from there - BPE sch'd 02/01/20 at 11 (1045), npo 3 hrs, patient aware

## 2020-02-01 ENCOUNTER — Ambulatory Visit (HOSPITAL_COMMUNITY): Payer: Medicaid Other

## 2020-02-02 ENCOUNTER — Ambulatory Visit (HOSPITAL_COMMUNITY): Payer: Medicaid Other

## 2020-02-03 ENCOUNTER — Other Ambulatory Visit: Payer: Self-pay

## 2020-02-03 ENCOUNTER — Ambulatory Visit (HOSPITAL_COMMUNITY)
Admission: RE | Admit: 2020-02-03 | Discharge: 2020-02-03 | Disposition: A | Discharge status: Home / Self Care | Payer: Medicaid Other | Source: Ambulatory Visit | Attending: Internal Medicine | Admitting: Internal Medicine

## 2020-02-03 DIAGNOSIS — R1319 Other dysphagia: Secondary | ICD-10-CM

## 2020-02-03 DIAGNOSIS — R1310 Dysphagia, unspecified: Secondary | ICD-10-CM | POA: Diagnosis not present

## 2020-05-04 ENCOUNTER — Telehealth (INDEPENDENT_AMBULATORY_CARE_PROVIDER_SITE_OTHER): Payer: Self-pay | Admitting: Internal Medicine

## 2020-05-04 NOTE — Telephone Encounter (Signed)
Patient called stated his dysphagia is getting worse - stated he was supposed to have a procedure at [facility]  But fell thru - stated his PCP wanted to make sure Dr Xavier Livingston knows this - please advise - [phone]

## 2020-05-04 NOTE — Telephone Encounter (Signed)
Dr.Rehman has been sent a message.

## 2020-05-05 NOTE — Telephone Encounter (Signed)
Dr.Rehman ask to find out if the patient has lost more than 10 pounds. Patient will need office visit to determine next step.  Patient called.Voicemail not set up yet. Unable to leave a message.  Mitzie - if patient calls back , see above .

## 2020-05-09 ENCOUNTER — Ambulatory Visit (INDEPENDENT_AMBULATORY_CARE_PROVIDER_SITE_OTHER): Payer: Medicaid Other | Admitting: Gastroenterology

## 2020-05-09 ENCOUNTER — Encounter (INDEPENDENT_AMBULATORY_CARE_PROVIDER_SITE_OTHER): Payer: Self-pay | Admitting: Gastroenterology

## 2020-05-09 ENCOUNTER — Other Ambulatory Visit: Payer: Self-pay

## 2020-05-09 VITALS — BP 153/91 | HR 68 | Temp 97.0°F | Ht 71.0 in | Wt 153.0 lb

## 2020-05-09 DIAGNOSIS — R1310 Dysphagia, unspecified: Secondary | ICD-10-CM

## 2020-05-09 NOTE — Patient Instructions (Signed)
I will discuss care w/ Dr Laural Golden and contact you with updated recommendations

## 2020-05-09 NOTE — Progress Notes (Signed)
Patient profile: Xavier Livingston is a 64 y.o. male seen for evaluation of  Dysphagia. Last seen in clinic 05/2019.   History of Present Illness: Xavier Livingston is seen today for daily symptoms of dysphagia.  These have been intermittent over many years but feels they are occurring daily and much more frequent at this time.  He describes spasms in his mid and lower esophageal area that can occur with any substance including liquids, solids, pills.  Feels when substances stick they "cut off his O2" and he has to "beat on his chest" to get substances to go down.  Episodes can last 3 to 5 minutes.  Due to the severity and frequency of the episodes he is eating mainly food such as Jell-O and ice cream.  Feels that it is hard to eat a full meal due to the episodes and has lost significant weight as below.  He endorses a good appetite but often stops eating due to the severity of dysphagia.  He denies any burning, nausea, vomiting.  Occasionally coughs up phlegm.  He has some intermittent off-and-on loose stools but otherwise denies any constipation, rectal bleeding, melena, lower abdominal pain.  He has 1 cigar daily, does use marijuana daily for pain.  Occasional alcohol weekends.  He is allergic to NSAIDs.  He had a manometry study at [facility] approximately 2 months ago that he reports was discontinued before completed due to gagging and nosebleeding  Wt Readings from Last 3 Encounters:  05/09/20 153 lb (69.4 kg)  09/01/19 158 lb 11.7 oz (72 kg)  07/29/19 165 lb (74.8 kg)     Last Colonoscopy: Findings: 2015  Prep satisfactory. Redundant colon but normal mucosa throughout. Normal rectal mucosa. Small hemorrhoids below the dentate line.   Last Endoscopy:  07/2019-Normal esophagus, irregular Z-line, normal stomach.   Esophageal manometry 2015-Impression:- Control of the distal esophageal acid was excellent. - Control of the gastric acid was fair.   - Normal absolute number of  gastroesophageal reflux events on PPI once daily.   - Negative symptom correlation for all symptoms   Past Medical History:  Past Medical History:  Diagnosis Date  . Anxiety   . Arthritis   . Complication of anesthesia    diff swallowing,reflx  . Dyspnea   . Dysrhythmia    irregular heartbeat in the 80's  . Enlarged prostate   . GERD (gastroesophageal reflux disease)   . Headache   . Hyperlipemia   . Hypertension   . Nocturia more than twice per night   . Reflux esophagitis   . Seizures ([place])    last at age 64 or 64 years old  . Smoker within last 12 months   . Swallowing difficulty     Problem List: Patient Active Problem List   Diagnosis Date Noted  . Abnormal esophagram 06/01/2019  . Radiculopathy of cervical region 02/06/2019  . Pseudoarthrosis of cervical spine ([place]) 02/05/2018  . Gastroesophageal reflux disease without esophagitis 04/17/2017  . Dysphagia 04/17/2017  . Esophageal dysphagia 09/19/2016  . Myelopathy, spondylogenic, cervical 04/11/2015  . Essential hypertension, benign 03/17/2014  . Dysphagia, unspecified(787.20) 03/17/2014  . High cholesterol 03/17/2014    Past Surgical History: Past Surgical History:  Procedure Laterality Date  . ANTERIOR CERVICAL DECOMPRESSION/DISCECTOMY FUSION 4 LEVELS N/A 04/11/2015   Procedure: Anterior Cervical Discectomy/ Decompression Fusion Cervical three-four, Cervical four-five, Cervical five-six, Cervical six- seven;  Surgeon: Kary Kos, MD;  Location: [place] NEURO ORS;  Service: Neurosurgery;  Laterality: N/A;  .  BACK SURGERY    . COLONOSCOPY N/A 08/18/2014   Procedure: COLONOSCOPY;  Surgeon: Rogene Houston, MD;  [facility];  Service: Endoscopy;  Laterality: N/A;  1030-moved to 1200 Ann to notify pt  . ESOPHAGEAL DILATION N/A 11/29/2016   Procedure: ESOPHAGEAL DILATION;  Surgeon: Rogene Houston, MD;  [facility];  Service: Endoscopy;  Laterality: N/A;  . ESOPHAGEAL DILATION N/A 05/16/2017    Procedure: ESOPHAGEAL DILATION;  Surgeon: Rogene Houston, MD;  [facility];  Service: Endoscopy;  Laterality: N/A;  . ESOPHAGEAL DILATION N/A 07/29/2019   Procedure: ESOPHAGEAL DILATION;  Surgeon: Rogene Houston, MD;  [facility];  Service: Endoscopy;  Laterality: N/A;  . ESOPHAGOGASTRODUODENOSCOPY N/A 08/18/2014   Procedure: ESOPHAGOGASTRODUODENOSCOPY (EGD);  Surgeon: Rogene Houston, MD;  [facility];  Service: Endoscopy;  Laterality: N/A;  . ESOPHAGOGASTRODUODENOSCOPY N/A 11/29/2016   Procedure: ESOPHAGOGASTRODUODENOSCOPY (EGD);  Surgeon: Rogene Houston, MD;  [facility];  Service: Endoscopy;  Laterality: N/A;  12:00  . ESOPHAGOGASTRODUODENOSCOPY N/A 07/29/2019   Procedure: ESOPHAGOGASTRODUODENOSCOPY (EGD);  Surgeon: Rogene Houston, MD;  [facility];  Service: Endoscopy;  Laterality: N/A;  2:00  . ESOPHAGOGASTRODUODENOSCOPY (EGD) WITH PROPOFOL N/A 05/16/2017   Procedure: ESOPHAGOGASTRODUODENOSCOPY (EGD) WITH PROPOFOL;  Surgeon: Rogene Houston, MD;  [facility];  Service: Endoscopy;  Laterality: N/A;  8:30  . excision of mass of back    . MALONEY DILATION  08/18/2014   Procedure: MALONEY DILATION;  Surgeon: Rogene Houston, MD;  [facility];  Service: Endoscopy;;  . MASS EXCISION N/A 10/21/2013   Procedure: EXCISION MASSES X 2 ON BACK;  Surgeon: Scherry Ran, MD;  [facility];  Service: General;  Laterality: N/A;  . NASAL SEPTUM SURGERY    . POSTERIOR CERVICAL FUSION/FORAMINOTOMY N/A 02/05/2018   Procedure: Posterior Cervical Fusion with lateral mass fixation - Cervical six-seven;  Surgeon: Kary Kos, MD;  Location: [place];  Service: Neurosurgery;  Laterality: N/A;  . POSTERIOR CERVICAL FUSION/FORAMINOTOMY N/A 02/06/2019   Procedure: Posterior Cervical Fusion with lateral mass fixation redo C6-T1;  Surgeon: Kary Kos, MD;  Location: [place];  Service: Neurosurgery;  Laterality: N/A;  . SHOULDER  ARTHROSCOPY WITH ROTATOR CUFF REPAIR Left 09/01/2019   Procedure: ARTHROSCOPY SHOULDER ROTATOR CUFF REPAIR;  Surgeon: Tania Ade, MD;  Location: [place];  Service: Orthopedics;  Laterality: Left;  . SHOULDER ARTHROSCOPY WITH SUBACROMIAL DECOMPRESSION Left 09/01/2019   Procedure: SHOULDER ARTHROSCOPY WITH SUBACROMIAL DECOMPRESSION;  Surgeon: Tania Ade, MD;  Location: [place];  Service: Orthopedics;  Laterality: Left;    Allergies: No Known Allergies    Home Medications:  Current Outpatient Medications:  .  amLODipine (NORVASC) 5 MG tablet, Take 5 mg by mouth daily. , Disp: , Rfl:  .  diltiazem (CARDIZEM) 30 MG tablet, Take 1 tablet (30 mg total) by mouth 3 (three) times daily before meals., Disp: 90 tablet, Rfl: 2 .  oxyCODONE-acetaminophen (PERCOCET) 5-325 MG tablet, Take 1-2 tablets every 4 hours as needed for post operative pain. MAX 6/day, Disp: 30 tablet, Rfl: 0 .  pravastatin (PRAVACHOL) 40 MG tablet, Take 40 mg by mouth daily., Disp: , Rfl:  .  tiZANidine (ZANAFLEX) 4 MG tablet, Take 1 tablet (4 mg total) by mouth every 8 (eight) hours as needed for muscle spasms., Disp: 30 tablet, Rfl: 1 .  cholecalciferol (VITAMIN D3) 25 MCG (1000 UT) tablet, Take 1,000 Units by  mouth daily., Disp: , Rfl:  .  esomeprazole (NEXIUM) 40 MG packet, Take 40 mg by mouth daily before breakfast. (Patient not taking: Reported on 05/09/2020), Disp: 30 each, Rfl: 5   Family History: family history is not on file.    Social History:   reports that he quit smoking about 2 years ago. His smoking use included cigarettes and cigars. He has a 10.00 pack-year smoking history. He has never used smokeless tobacco. He reports current alcohol use of about 6.0 standard drinks of alcohol per week. He reports current drug use. Drug: Marijuana.   Review of Systems: Constitutional: Denies weight loss/weight gain  Eyes: No changes in vision. ENT: No oral lesions, sore throat.    GI: see HPI.  Heme/Lymph: No easy bruising.  CV: No chest pain.  GU: No hematuria.  Integumentary: No rashes.  Neuro: No headaches.  Psych: No depression/anxiety.  Endocrine: No heat/cold intolerance.  Allergic/Immunologic: No urticaria.  Resp: No cough, SOB.  Musculoskeletal: No joint swelling.    Physical Examination: BP (!) 153/91 (BP Location: Right Arm, Patient Position: Sitting, Cuff Size: Large)   Pulse 68   Temp (!) 97 F (36.1 C) (Temporal)   Ht 5\' 11"  (1.803 m)   Wt 153 lb (69.4 kg)   BMI 21.34 kg/m  Gen: NAD, alert and oriented x 4 HEENT: PEERLA, EOMI, Neck: supple, no JVD Chest: CTA bilaterally, no wheezes, crackles, or other adventitious sounds CV: RRR, no m/g/c/r Abd: soft, NT, ND, +BS in all four quadrants; no HSM, guarding, ridigity, or rebound tenderness Ext: no edema, well perfused with 2+ pulses, Skin: no rash or lesions noted on observed skin Lymph: no noted LAD  Data Reviewed:  Barium swallow 02/04/20--IMPRESSION: Small sliding hiatal hernia. Minimal irregularity at the GE junction, which appears minimally narrowed, question esophagitis. No definite stricture identified, with 12.5 mm diameter barium tablet passing to stomach without obstruction.   05/28/19-IMPRESSION: Initially, significant narrowing of the GE junction on initial swallows though this did not persist throughout the exam. 12.5 mm diameter barium tablet passed from oral cavity to stomach without obstruction. Findings could reflect increased tonicity or transient spasm of the GE junction/lower esophageal sphincter. No fixed esophageal stricture or mass.  Assessment/Plan: Mr. Styer is a 64 y.o. male with severe dysphagia leading to weight loss.  He has had endoscopy and barium swallow within last 1 year, images reviewed with Dr. Laural Golden.  Some concern for underlying achalasia.  We will try increasing his calcium channel blocker dosage to diltiazem 60 mg 3 times daily.  Ultimately he  needs esophageal manometry, this was attempted but was stopped due to gagging and nosebleeding.  We will plan for manometry with propofol sedation to place probe, will coordinate [facility] with [facility].  Further recommendations pending manometry results   Case discussed w/ Dr Laural Golden. Attempted to call patient w/ updated recs after visit. No VM set up. Will try again tomorrow   Mahmood was seen today for follow-up.  Diagnoses and all orders for this visit:  Dysphagia, unspecified type     I personally performed the service, non-incident to. (WP)  Laurine Blazer, [facility]

## 2020-05-10 ENCOUNTER — Telehealth (INDEPENDENT_AMBULATORY_CARE_PROVIDER_SITE_OTHER): Payer: Self-pay | Admitting: Gastroenterology

## 2020-05-10 NOTE — Telephone Encounter (Signed)
Patient presented Medicare Ins Card - verification was inactive for A & B coverage

## 2020-05-11 ENCOUNTER — Telehealth (INDEPENDENT_AMBULATORY_CARE_PROVIDER_SITE_OTHER): Payer: Self-pay | Admitting: Gastroenterology

## 2020-05-11 MED ORDER — DILTIAZEM HCL ER 60 MG PO CP12: 60.0000 mg | ORAL_CAPSULE | Freq: Two times a day (BID) | ORAL | 3 refills | Status: DC

## 2020-05-11 NOTE — Telephone Encounter (Signed)
I contacted patient with updated recommendations and discussed via phone.  We will try diltiazem 60 mg 3 times daily, he did not have much improvement at diltiazem 30 mg 3 times daily before meals.  Tammy-he does not have a blood pressure cuff at home or a way to monitor his blood pressure, since diltiazem can affect blood pressure I asked him to come by the office next week for a quick blood pressure check. He is planning to come Monday morning. Thanks.

## 2020-05-11 NOTE — Telephone Encounter (Signed)
Mitzie - please add patient as a nurse visit  for Monday morning June 14. This is a Blood Pressure check per [facility] Headings.

## 2020-05-16 ENCOUNTER — Ambulatory Visit (INDEPENDENT_AMBULATORY_CARE_PROVIDER_SITE_OTHER): Payer: Medicaid Other

## 2020-05-16 ENCOUNTER — Telehealth (INDEPENDENT_AMBULATORY_CARE_PROVIDER_SITE_OTHER): Payer: Self-pay | Admitting: *Deleted

## 2020-05-16 ENCOUNTER — Other Ambulatory Visit: Payer: Self-pay

## 2020-05-16 NOTE — Telephone Encounter (Signed)
Patient was seen in the office last week.

## 2020-05-16 NOTE — Telephone Encounter (Signed)
Patient was seen int he office on 06/072021. BP was 153/91 at this time.  He was ask to return today for a B/P Check. His BP was 124/83 , HR 69.  Patient shares tht [facility] had called and today will be his 4 th time trying to return the call to them.  Drinking Liquids are causing him to feel that everything is sticking in his chest. Chest is sore and when he breaths he feels a chest pain.

## 2020-08-04 ENCOUNTER — Other Ambulatory Visit (INDEPENDENT_AMBULATORY_CARE_PROVIDER_SITE_OTHER): Payer: Self-pay | Admitting: Internal Medicine

## 2020-08-11 ENCOUNTER — Telehealth (INDEPENDENT_AMBULATORY_CARE_PROVIDER_SITE_OTHER): Payer: Self-pay | Admitting: Gastroenterology

## 2020-08-11 DIAGNOSIS — R1310 Dysphagia, unspecified: Secondary | ICD-10-CM

## 2020-08-11 DIAGNOSIS — K224 Dyskinesia of esophagus: Secondary | ICD-10-CM

## 2020-08-11 NOTE — Telephone Encounter (Signed)
  Case discussed w/ Dr Laural Golden.  Esophageal manometry reviewed.  Biopsies done on EGD reviewed concern for possible autoimmune disease with Sjogren's as etiology of esophageal dysmotility.  Will check basic labs as below for Sjogren's.  If these return normal will try course of prednisone 30 mg dropping by 5 mg each week.  We will plan to see him in the office when he is on 10 mg.    I attempted to contact patient with an updated plan based on his esophageal manometry from [facility].  No answer and was unable to leave message on machine.  Please try him again tomorrow and see if he is able to come for labs next week. Thanks.

## 2020-08-12 ENCOUNTER — Other Ambulatory Visit (INDEPENDENT_AMBULATORY_CARE_PROVIDER_SITE_OTHER): Payer: Self-pay | Admitting: *Deleted

## 2020-08-12 DIAGNOSIS — K224 Dyskinesia of esophagus: Secondary | ICD-10-CM

## 2020-08-12 DIAGNOSIS — R933 Abnormal findings on diagnostic imaging of other parts of digestive tract: Secondary | ICD-10-CM

## 2020-08-12 DIAGNOSIS — R1310 Dysphagia, unspecified: Secondary | ICD-10-CM

## 2020-08-12 NOTE — Telephone Encounter (Signed)
Tried to call patient his voicemail hasn't been set up yet.

## 2020-08-15 NOTE — Telephone Encounter (Signed)
Forward to [employer]

## 2020-08-15 NOTE — Telephone Encounter (Signed)
Tried calling no answer and no vm to leave a message. CLS 08/15/2020

## 2020-08-16 ENCOUNTER — Telehealth (INDEPENDENT_AMBULATORY_CARE_PROVIDER_SITE_OTHER): Payer: Self-pay | Admitting: Gastroenterology

## 2020-08-16 ENCOUNTER — Encounter (INDEPENDENT_AMBULATORY_CARE_PROVIDER_SITE_OTHER): Payer: Self-pay

## 2020-08-16 NOTE — Telephone Encounter (Signed)
Letter mailed to the patient 08/16/2020 asking patient to call the office.

## 2020-08-16 NOTE — Telephone Encounter (Signed)
I spoke with patient he is aware to have lab drawn this week and await results to see if he needs to start prednisone.

## 2020-08-16 NOTE — Telephone Encounter (Signed)
We tried to call patient several times last week. We need to get some additional labs which I have ordered. Concern that autoimmune disease is affecting his esophagus. Patient will need a f/up with me or Dr Laural Golden after he has labs and can go over further details. Please let him know to come for [facility] at [facility] when able. Thanks.    Prior phone message: Case discussed w/ Dr Laural Golden.  Esophageal manometry reviewed.  Biopsies done on EGD reviewed concern for possible autoimmune disease with Sjogren's as etiology of esophageal dysmotility.  Will check basic labs as below for Sjogren's.  If these return normal will try course of prednisone 30 mg dropping by 5 mg each week.  We will plan to see him in the office when he is on 10 mg.   I attempted to contact patient with an updated plan based on his esophageal manometry from [facility].  No answer and was unable to leave message on machine.  Please try him again tomorrow and see if he is able to come for labs next week

## 2020-08-16 NOTE — Telephone Encounter (Signed)
Unable to reach patient vm is full.

## 2020-08-16 NOTE — Telephone Encounter (Signed)
I called and vm is not set up for this patient.

## 2020-08-16 NOTE — Telephone Encounter (Signed)
Patient called to get results from [place] - please advise [phone]

## 2020-08-16 NOTE — Telephone Encounter (Signed)
Please send note to patient, unable to reach via phone. Thanks

## 2020-08-16 NOTE — Telephone Encounter (Signed)
May I ask you to call the patient.

## 2020-08-16 NOTE — Telephone Encounter (Signed)
A letter has been sent to the patient to call the office as we have not been successful reaching him by phone.

## 2020-08-22 LAB — C3 AND C4
C3 Complement: 109 mg/dL (ref 82–185)
C4 Complement: 34 mg/dL (ref 15–53)

## 2020-08-22 LAB — RHEUMATOID FACTOR: Rheumatoid fact SerPl-aCnc: <14 IU/mL (ref <14)

## 2020-08-22 LAB — SJOGRENS SYNDROME-A EXTRACTABLE NUCLEAR ANTIBODY: SSA (Ro) (ENA) Antibody, IgG: <1 AI

## 2020-08-22 LAB — SJOGRENS SYNDROME-B EXTRACTABLE NUCLEAR ANTIBODY: SSB (La) (ENA) Antibody, IgG: <1 AI

## 2020-08-22 LAB — ANA: Anti Nuclear Antibody (ANA): NEGATIVE

## 2020-08-22 NOTE — Telephone Encounter (Signed)
Will hold off on starting prednisone until after OV.

## 2020-08-22 NOTE — Telephone Encounter (Signed)
Patient planning to see Dr Laural Golden tomorrow.

## 2020-08-23 ENCOUNTER — Encounter (INDEPENDENT_AMBULATORY_CARE_PROVIDER_SITE_OTHER): Payer: Self-pay | Admitting: Internal Medicine

## 2020-08-23 ENCOUNTER — Other Ambulatory Visit: Payer: Self-pay

## 2020-08-23 ENCOUNTER — Ambulatory Visit (INDEPENDENT_AMBULATORY_CARE_PROVIDER_SITE_OTHER): Payer: Medicare HMO | Admitting: Internal Medicine

## 2020-08-23 VITALS — BP 143/90 | HR 71 | Temp 96.8°F | Ht 71.0 in | Wt 160.7 lb

## 2020-08-23 DIAGNOSIS — R1310 Dysphagia, unspecified: Secondary | ICD-10-CM

## 2020-08-23 DIAGNOSIS — K219 Gastro-esophageal reflux disease without esophagitis: Secondary | ICD-10-CM | POA: Diagnosis not present

## 2020-08-23 DIAGNOSIS — K208 Other esophagitis without bleeding: Secondary | ICD-10-CM

## 2020-08-23 DIAGNOSIS — R1319 Other dysphagia: Secondary | ICD-10-CM

## 2020-08-23 MED ORDER — PREDNISONE 10 MG PO TABS: 30.0000 mg | ORAL_TABLET | Freq: Every day | ORAL | 0 refills | Status: DC

## 2020-08-23 MED ORDER — PANTOPRAZOLE SODIUM 40 MG PO TBEC: 40.0000 mg | DELAYED_RELEASE_TABLET | Freq: Every day | ORAL | 5 refills | Status: DC

## 2020-08-23 NOTE — Patient Instructions (Signed)
Take pantoprazole 40 mg by mouth 30 minutes before breakfast daily. Take prednisone with breakfast every morning as follows 30 mg or 3 tablets daily for 1 week 25 mg or 2 at half tablets daily for 1 week 20 mg or 2 tablets daily for 1 week 15 mg or 1-1/2 tablets daily for 1 week 10 mg or 1 tablet daily for 1 week Stay on 10 mg until office visit  Call office if you experience any side effects with prednisone Remember to chew food thoroughly and eat slowly and for now avoid red meat.

## 2020-08-23 NOTE — Progress Notes (Signed)
Presenting complaint;  Follow-up for dysphagia and weight loss.  Database and subjective:  Patient is 64 year old Afro-American male who has history of GERD and dysphagia.  Barium study in June 2020 revealed mild age-related esophageal dysmotility and slight narrowing at GE junction.  However barium pill passed across this area without any delay.  He was evaluated with EGD in August last year with empiric dilation which only helped for few days a few weeks.  Esophageal manometry was recommended but he was not able to proceed with it. He had another barium study in March this year which revealed small sliding hiatal hernia age-related esophageal dysmotility with incomplete clearance of barium by primary peristalsis and focal irregularity at GE junction. Once again we recommended esophageal manometry.  First attempt was unsuccessful.  He went back for endoscopic placement of catheter for manometry.  He had the study done on 08/04/2020.  EGD was essentially normal.  Manometry was abnormal and results are as below. Esophageal biopsy however showed lymphocytic esophagitis. Patient was advised to go to the lab for lab studies to look for Sjogren's syndrome.  He remains with dysphagia.  She is able to handle soft foods like soups ice cream and Jell-O.  He has gained 7 pounds in the last 3 months.  All in all he has lost more than 50 pounds.  He is still having some soreness in his chest.  He had an episode of food impaction about 2 weeks ago when he was eating hamburger.  When this occurred he gets apprehensive and feels tired for a while.  He also experienced transient syncope when this occurred.  No history of shortness of breath palpitation or diaphoresis. He is drinking 2 cans of Glucerna every day. He has never been diagnosed with autoimmune disorder. He states his sister has rheumatoid arthritis and mother was also diagnosed with rheumatoid arthritis. He did not get any relief with Cardizem.  Current  Medications: Outpatient Encounter Medications as of 08/23/2020  Medication Sig  . amLODipine (NORVASC) 5 MG tablet Take 5 mg by mouth daily.   . cholecalciferol (VITAMIN D3) 25 MCG (1000 UT) tablet Take 1,000 Units by mouth daily.  [name] diltiazem (CARDIZEM SR) 60 MG 12 hr capsule Take 1 capsule (60 mg total) by mouth 2 (two) times daily.  [name] oxyCODONE-acetaminophen (PERCOCET) 5-325 MG tablet Take 1-2 tablets every 4 hours as needed for post operative pain. MAX 6/day  . pravastatin (PRAVACHOL) 40 MG tablet Take 40 mg by mouth daily.  [name] tiZANidine (ZANAFLEX) 4 MG tablet Take 1 tablet (4 mg total) by mouth every 8 (eight) hours as needed for muscle spasms.  . [DISCONTINUED] esomeprazole (NEXIUM) 40 MG packet Take 40 mg by mouth daily before breakfast. (Patient not taking: Reported on 05/09/2020)   No facility-administered encounter medications on file as of 08/23/2020.     Objective: Blood pressure (!) 143/90, pulse 71, temperature (!) 96.8 F (36 C), temperature source Temporal, height 5\' 11"  (1.803 m), weight 160 lb 11.2 oz (72.9 kg). Patient is alert and in no acute distress. Patient is wearing a mask. Conjunctiva is pink. Sclera is nonicteric Oropharyngeal mucosa is normal. He has upper lower dentures in place. No neck masses or thyromegaly noted. Cardiac exam with regular rhythm normal S1 and S2. No murmur or gallop noted. Lungs are clear to auscultation. Abdomen is symmetrical soft and nontender with no organomegaly or masses. No LE edema or clubbing noted.  Labs/studies Results: Lab data from 08/19/2020  Rheumatoid factor is negative  ANA negative SSA IgA antibody negative SSB IgG antibody negative C3 complement normal C4 complement normal  Esophageal manometry results as follows.  Assessment:  #1.  Esophageal dysphagia secondary to esophageal motility disorder.  Esophageal manometry is abnormal suggesting underlying autoimmune disease i.e. Sjogren's syndrome but it also could be  idiopathic.  Biochemical markers for Sjogren's syndrome are negative.  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  He will need a follow-up EGD with biopsy to confirm response to therapy.  #2.  GERD.  He has GERD symptoms.  He is at risk to develop significant esophagitis given underlying motility disorder and he needs to be on a PPI.   Plan:  Pantoprazole 40 mg by mouth 30 minutes before breakfast daily. Begin prednisone 30 mg by mouth daily with breakfast. Reduce dose by 5 mg every week. Patient informed of potential side effects such as fluid retention. Patient will continue to stick with mechanical soft food for now.  He should chew his food thoroughly and eat slowly. Patient will return for follow-up visit in 4 weeks.

## 2020-08-31 NOTE — Progress Notes (Signed)
error 

## 2020-09-19 ENCOUNTER — Encounter (INDEPENDENT_AMBULATORY_CARE_PROVIDER_SITE_OTHER): Payer: Self-pay | Admitting: Gastroenterology

## 2020-09-19 ENCOUNTER — Encounter (INDEPENDENT_AMBULATORY_CARE_PROVIDER_SITE_OTHER): Payer: Self-pay | Admitting: *Deleted

## 2020-09-19 ENCOUNTER — Ambulatory Visit (INDEPENDENT_AMBULATORY_CARE_PROVIDER_SITE_OTHER): Payer: Medicare HMO | Admitting: Gastroenterology

## 2020-09-19 ENCOUNTER — Encounter (INDEPENDENT_AMBULATORY_CARE_PROVIDER_SITE_OTHER): Payer: Self-pay

## 2020-09-19 ENCOUNTER — Other Ambulatory Visit: Payer: Self-pay

## 2020-09-19 VITALS — BP 166/98 | HR 76 | Temp 98.0°F | Ht 71.0 in | Wt 158.3 lb

## 2020-09-19 DIAGNOSIS — R1310 Dysphagia, unspecified: Secondary | ICD-10-CM

## 2020-09-19 NOTE — Patient Instructions (Signed)
We are scheduling endoscopy for evaluation. Continue protonix and prednisone taper

## 2020-09-19 NOTE — Progress Notes (Signed)
Patient profile: Xavier Livingston is a 64 y.o. male seen for follow up of dysphagia.  History of Present Illness: Xavier Livingston is seen today for follow-up of dysphagia.  He reports taking the prednisone over the past few weeks and not noting any improvement.  He has weaned down to 10 mg daily.  Continues to feel that he has pain in his upper to mid esophagus with both liquids and foods. Symptoms occur daily. Can describe it as a feels numb at times and all substances seem to stick in that area. Does not note that prednisone improve dysphagia.  He denies any significant indigestion GERD symptoms on PPI. No nausea vomiting. Has had significant weight loss over #50lbs total since symptoms began but does seem to have stabilized some since last OV.  He is having a bowel movement 1-2 times a day.  He denies any blood in stool, constipation, diarrhea, melena, abdominal pain.  He denies frequent NSAID use.  Wt Readings from Last 3 Encounters:  09/19/20 158 lb 4.8 oz (71.8 kg)  08/23/20 160 lb 11.2 oz (72.9 kg)  05/09/20 153 lb (69.4 kg)     Last Colonoscopy: 2015-Last Colonoscopy: Findings: 2015 Prep satisfactory. Redundant colon but normal mucosa throughout. Normal rectal mucosa. Small hemorrhoids below the dentate line.   Last Endoscopy: 07/2019-Normal esophagus, irregular Z-line, normal stomach.   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 Anxiety   . Arthritis   . Complication of anesthesia    diff swallowing,reflx  . Dyspnea   . Dysrhythmia    irregular heartbeat in the 80's  . Enlarged prostate   . GERD (gastroesophageal reflux disease)   . Headache   . Hyperlipemia   . Hypertension   . Nocturia more than twice per night   . Reflux esophagitis   . Seizures ([place])    last at age 64 or 64 years old  . Smoker within last 12 months    . Swallowing difficulty     Problem List: Patient Active Problem List   Diagnosis Date Noted  . 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 Abnormal esophagram 06/01/2019  . Radiculopathy of cervical region 02/06/2019  . Pseudoarthrosis of cervical spine ([place]) 02/05/2018  . Gastroesophageal reflux disease without esophagitis 04/17/2017  . Dysphagia 04/17/2017  . Esophageal dysphagia 09/19/2016  . Myelopathy, spondylogenic, cervical 04/11/2015  . Essential hypertension, benign 03/17/2014  . Dysphagia, unspecified(787.20) 03/17/2014  . High cholesterol 03/17/2014    Past Surgical History: Past Surgical History:  Procedure Laterality Date  . ANTERIOR CERVICAL DECOMPRESSION/DISCECTOMY FUSION 4 LEVELS N/A 04/11/2015   Procedure: Anterior Cervical Discectomy/ Decompression Fusion Cervical three-four, Cervical four-five, Cervical five-six, Cervical six- seven;  Surgeon: Kary Kos, MD;  Location: [facility];  Service: Neurosurgery;  Laterality: N/A;  . BACK SURGERY    . COLONOSCOPY N/A 08/18/2014   Procedure: COLONOSCOPY;  Surgeon: Rogene Houston, MD;  [facility];  Service: Endoscopy;  Laterality: N/A;  1030-moved to 1200 Ann to notify pt  . ESOPHAGEAL DILATION N/A 11/29/2016   Procedure: ESOPHAGEAL DILATION;  Surgeon: Rogene Houston, MD;  [facility];  Service: Endoscopy;  Laterality: N/A;  . ESOPHAGEAL DILATION N/A 05/16/2017   Procedure: ESOPHAGEAL DILATION;  Surgeon: Rogene Houston, MD;  [facility];  Service: Endoscopy;  Laterality: N/A;  . ESOPHAGEAL DILATION N/A 07/29/2019   Procedure:  ESOPHAGEAL DILATION;  Surgeon: Rogene Houston, MD;  [facility];  Service: Endoscopy;  Laterality: N/A;  . ESOPHAGOGASTRODUODENOSCOPY N/A 08/18/2014   Procedure: ESOPHAGOGASTRODUODENOSCOPY (EGD);  Surgeon: Rogene Houston, MD;  [facility];  Service: Endoscopy;  Laterality: N/A;  . ESOPHAGOGASTRODUODENOSCOPY N/A 11/29/2016   Procedure:  ESOPHAGOGASTRODUODENOSCOPY (EGD);  Surgeon: Rogene Houston, MD;  [facility];  Service: Endoscopy;  Laterality: N/A;  12:00  . ESOPHAGOGASTRODUODENOSCOPY N/A 07/29/2019   Procedure: ESOPHAGOGASTRODUODENOSCOPY (EGD);  Surgeon: Rogene Houston, MD;  [facility];  Service: Endoscopy;  Laterality: N/A;  2:00  . ESOPHAGOGASTRODUODENOSCOPY (EGD) WITH PROPOFOL N/A 05/16/2017   Procedure: ESOPHAGOGASTRODUODENOSCOPY (EGD) WITH PROPOFOL;  Surgeon: Rogene Houston, MD;  [facility];  Service: Endoscopy;  Laterality: N/A;  8:30  . excision of mass of back    . MALONEY DILATION  08/18/2014   Procedure: MALONEY DILATION;  Surgeon: Rogene Houston, MD;  [facility];  Service: Endoscopy;;  . MASS EXCISION N/A 10/21/2013   Procedure: EXCISION MASSES X 2 ON BACK;  Surgeon: Scherry Ran, MD;  Location: [facility];  Service: General;  Laterality: N/A;  . NASAL SEPTUM SURGERY    . POSTERIOR CERVICAL FUSION/FORAMINOTOMY N/A 02/05/2018   Procedure: Posterior Cervical Fusion with lateral mass fixation - Cervical six-seven;  Surgeon: Kary Kos, MD;  Location: [facility];  Service: Neurosurgery;  Laterality: N/A;  . POSTERIOR CERVICAL FUSION/FORAMINOTOMY N/A 02/06/2019   Procedure: Posterior Cervical Fusion with lateral mass fixation redo C6-T1;  Surgeon: Kary Kos, MD;  Location: [facility];  Service: Neurosurgery;  Laterality: N/A;  . SHOULDER ARTHROSCOPY WITH ROTATOR CUFF REPAIR Left 09/01/2019   Procedure: ARTHROSCOPY SHOULDER ROTATOR CUFF REPAIR;  Surgeon: Tania Ade, MD;  Location: [facility];  Service: Orthopedics;  Laterality: Left;  . SHOULDER ARTHROSCOPY WITH SUBACROMIAL DECOMPRESSION Left 09/01/2019   Procedure: SHOULDER ARTHROSCOPY WITH SUBACROMIAL DECOMPRESSION;  Surgeon: Tania Ade, MD;  Location: [facility];  Service: Orthopedics;  Laterality: Left;    Allergies: No Known Allergies    Home Medications:  Current Outpatient  Medications:  .  amLODipine (NORVASC) 5 MG tablet, Take 5 mg by mouth daily. , Disp: , Rfl:  .  cholecalciferol (VITAMIN D3) 25 MCG (1000 UT) tablet, Take 1,000 Units by mouth daily., Disp: , Rfl:  .  oxyCODONE-acetaminophen (PERCOCET) 5-325 MG tablet, Take 1-2 tablets every 4 hours as needed for post operative pain. MAX 6/day, Disp: 30 tablet, Rfl: 0 .  pantoprazole (PROTONIX) 40 MG tablet, Take 1 tablet (40 mg total) by mouth daily before breakfast., Disp: 30 tablet, Rfl: 5 .  pravastatin (PRAVACHOL) 40 MG tablet, Take 40 mg by mouth daily., Disp: , Rfl:  .  predniSONE (DELTASONE) 10 MG tablet, Take 3 tablets (30 mg total) by mouth daily with breakfast., Disp: 100 tablet, Rfl: 0 .  tiZANidine (ZANAFLEX) 4 MG tablet, Take 1 tablet (4 mg total) by mouth every 8 (eight) hours as needed for muscle spasms., Disp: 30 tablet, Rfl: 1 .  diltiazem (CARDIZEM SR) 60 MG 12 hr capsule, Take 1 capsule (60 mg total) by mouth 2 (two) times daily. (Patient not taking: Reported on 09/19/2020), Disp: 60 capsule, Rfl: 3   Family History: family history is not on file.    Social History:   reports that he quit smoking about 2 years ago. His smoking use included cigarettes and cigars. He has a 10.00 pack-year smoking history. He has never used  smokeless tobacco. He reports current alcohol use of about 6.0 standard drinks of alcohol per week. He reports current drug use. Drug: Marijuana.   Review of Systems: Constitutional: Denies weight loss/weight gain  Eyes: No changes in vision. ENT: No oral lesions, sore throat.  GI: see HPI.  Heme/Lymph: No easy bruising.  CV: No chest pain.  GU: No hematuria.  Integumentary: No rashes.  Neuro: No headaches.  Psych: No depression/anxiety.  Endocrine: No heat/cold intolerance.  Allergic/Immunologic: No urticaria.  Resp: No cough, SOB.  Musculoskeletal: No joint swelling.    Physical Examination: BP (!) 166/98 (BP Location: Right Arm, Patient Position: Sitting, Cuff  Size: Normal)   Pulse 76   Temp 98 F (36.7 C) (Oral)   Ht 5\' 11"  (1.803 m)   Wt 158 lb 4.8 oz (71.8 kg)   BMI 22.08 kg/m  Gen: NAD, alert and oriented x 4 HEENT: PEERLA, EOMI, Neck: supple, no JVD Chest: CTA bilaterally, no wheezes, crackles, or other adventitious sounds CV: RRR, no m/g/c/r Abd: soft, NT, ND, +BS in all four quadrants; no HSM, guarding, ridigity, or rebound tenderness Ext: no edema, well perfused with 2+ pulses, Skin: no rash or lesions noted on observed skin Lymph: no noted LAD  Data Reviewed:  08/2020-negative ANA, C3-C4, rheumatoid factor, Sjogren panel.   Assessment/Plan: Mr. Wangerin is a 64 y.o. male seen for f/up of dysphagia.   1. Dysphagia -empiric dilation August 2020 without lasting improvement.  Also reports multiple endoscopies in the past without long lasting improvement after empiric dilation.  Manometry showed esophageal motility disorder.  Biopsies suggest possible autoimmune etiology, serologic biomarkers were negative.  Since he had lymphocytic esophagitis was started on prednisone taper 30 mg with reducing 5 mg a week.  He is down to 10 mg without drastic improvement in symptoms.  We will tentatively schedule endoscopy with biopsies to confirm response to therapy.  He will continue soft diet in interim.   Doc was seen today for follow-up.  Diagnoses and all orders for this visit:  Dysphagia, unspecified type     I personally performed the service, non-incident to. (WP)  Laurine Blazer, [facility]

## 2020-09-21 ENCOUNTER — Other Ambulatory Visit (INDEPENDENT_AMBULATORY_CARE_PROVIDER_SITE_OTHER): Payer: Self-pay | Admitting: *Deleted

## 2020-09-28 ENCOUNTER — Other Ambulatory Visit (HOSPITAL_COMMUNITY)
Admission: RE | Admit: 2020-09-28 | Discharge: 2020-09-28 | Disposition: A | Discharge status: Home / Self Care | Payer: Medicare HMO | Source: Ambulatory Visit | Attending: Internal Medicine | Admitting: Internal Medicine

## 2020-09-28 ENCOUNTER — Other Ambulatory Visit: Payer: Self-pay

## 2020-09-28 DIAGNOSIS — Z01812 Encounter for preprocedural laboratory examination: Secondary | ICD-10-CM | POA: Insufficient documentation

## 2020-09-28 DIAGNOSIS — Z20822 Contact with and (suspected) exposure to covid-19: Secondary | ICD-10-CM | POA: Diagnosis not present

## 2020-09-28 LAB — SARS CORONAVIRUS 2 (TAT 6-24 HRS): SARS Coronavirus 2: NEGATIVE

## 2020-09-29 ENCOUNTER — Encounter (HOSPITAL_COMMUNITY): Payer: Self-pay | Admitting: Internal Medicine

## 2020-09-29 ENCOUNTER — Ambulatory Visit (HOSPITAL_COMMUNITY)
Admission: RE | Admit: 2020-09-29 | Discharge: 2020-09-29 | Disposition: A | Discharge status: Home / Self Care | Payer: Medicare HMO | Attending: Internal Medicine | Admitting: Internal Medicine

## 2020-09-29 ENCOUNTER — Encounter (HOSPITAL_COMMUNITY)
Admission: RE | Disposition: A | Discharge status: Home / Self Care | Payer: Self-pay | Source: Home / Self Care | Attending: Internal Medicine

## 2020-09-29 ENCOUNTER — Other Ambulatory Visit: Payer: Self-pay

## 2020-09-29 DIAGNOSIS — R1314 Dysphagia, pharyngoesophageal phase: Secondary | ICD-10-CM | POA: Insufficient documentation

## 2020-09-29 DIAGNOSIS — K449 Diaphragmatic hernia without obstruction or gangrene: Secondary | ICD-10-CM

## 2020-09-29 DIAGNOSIS — R1310 Dysphagia, unspecified: Secondary | ICD-10-CM

## 2020-09-29 DIAGNOSIS — K2289 Other specified disease of esophagus: Secondary | ICD-10-CM

## 2020-09-29 HISTORY — PX: ESOPHAGOGASTRODUODENOSCOPY: SHX5428

## 2020-09-29 HISTORY — PX: BIOPSY: SHX5522

## 2020-09-29 SURGERY — EGD (ESOPHAGOGASTRODUODENOSCOPY)
Anesthesia: Moderate Sedation

## 2020-09-29 MED ORDER — MIDAZOLAM HCL 5 MG/5ML IJ SOLN
INTRAMUSCULAR | Status: AC
  Filled 2020-09-29: qty 10

## 2020-09-29 MED ORDER — NYSTATIN 100000 UNIT/ML MT SUSP: 5.0000 mL | Freq: Four times a day (QID) | OROMUCOSAL | 1 refills | Status: DC

## 2020-09-29 MED ORDER — STERILE WATER FOR IRRIGATION IR SOLN
Status: DC | PRN
  Administered 2020-09-29: 1.5 mL

## 2020-09-29 MED ORDER — LIDOCAINE VISCOUS HCL 2 % MT SOLN
OROMUCOSAL | Status: DC | PRN
  Administered 2020-09-29: 4 mL via OROMUCOSAL

## 2020-09-29 MED ORDER — MEPERIDINE HCL 50 MG/ML IJ SOLN
INTRAMUSCULAR | Status: AC
  Filled 2020-09-29: qty 1

## 2020-09-29 MED ORDER — MIDAZOLAM HCL 5 MG/5ML IJ SOLN
INTRAMUSCULAR | Status: DC | PRN
  Administered 2020-09-29 (×5): 2 mg via INTRAVENOUS

## 2020-09-29 MED ORDER — MEPERIDINE HCL 50 MG/ML IJ SOLN
INTRAMUSCULAR | Status: DC | PRN
  Administered 2020-09-29 (×2): 25 mg via INTRAVENOUS

## 2020-09-29 MED ORDER — LIDOCAINE VISCOUS HCL 2 % MT SOLN
OROMUCOSAL | Status: AC
  Filled 2020-09-29: qty 15

## 2020-09-29 MED ORDER — SODIUM CHLORIDE 0.9 % IV SOLN: INTRAVENOUS | Status: DC

## 2020-09-29 NOTE — Op Note (Signed)
[facility] Patient Name: Xavier Livingston Procedure Date: 09/29/2020 12:21 PM MRN: [id number] Date of Birth: 08-27-1956 Attending MD: Hildred Laser , MD CSN: [id number] Age: 64 Admit Type: Outpatient Procedure:                Upper GI endoscopy Indications:              Diagnostic procedure, Esophageal dysphagia; history                            of lymphocytic esophagitis. Providers:                Hildred Laser, MD, Jeanann Lewandowsky. Sharon Seller, RN, Raphael Gibney, Technician Referring MD:              Medicines:                Lidocaine spray, Meperidine 50 mg IV, Midazolam 10                            mg IV Complications:            No immediate complications. Estimated Blood Loss:     Estimated blood loss was minimal. Procedure:                Pre-Anesthesia Assessment:                           - Prior to the procedure, a History and Physical                            was performed, and patient medications and                            allergies were reviewed. The patient's tolerance of                            previous anesthesia was also reviewed. The risks                            and benefits of the procedure and the sedation                            options and risks were discussed with the patient.                            All questions were answered, and informed consent                            was obtained. Prior Anticoagulants: The patient has                            taken no previous anticoagulant or antiplatelet  agents. ASA Grade Assessment: II - A patient with                            mild systemic disease. After reviewing the risks                            and benefits, the patient was deemed in                            satisfactory condition to undergo the procedure.                           After obtaining informed consent, the endoscope was                            passed under direct  vision. Throughout the                            procedure, the patient's blood pressure, pulse, and                            oxygen saturations were monitored continuously. The                            [id number]-H190 ([id number]) scope was introduced through the                            mouth, and advanced to the second part of duodenum.                            The upper GI endoscopy was accomplished without                            difficulty. The patient tolerated the procedure                            well. Scope In: 1:04:30 PM Scope Out: 1:13:59 PM Total Procedure Duration: 0 hours 9 minutes 29 seconds  Findings:      The hypopharynx was normal.      Localized mild mucosal changes characterized by discoloration and       granularity were found in the mid esophagus and in the distal esophagus.       Biopsies were taken with a cold forceps for histology. The pathology       specimen from distal esophagus was placed into Bottle Number 1. The       pathology specimen from proximal esophagus was placed into Bottle Number       2.      The Z-line was regular and was found 42 cm from the incisors.      A 2 cm hiatal hernia was present.      The entire examined stomach was normal.      The duodenal bulb and second portion of the duodenum were normal. Impression:               - Normal hypopharynx.                           -  Discolored, granular mucosa in the esophagus.                            Biopsied.                           - Z-line regular, 42 cm from the incisors.                           - 2 cm hiatal hernia.                           - Normal stomach.                           - Normal duodenal bulb and second portion of the                            duodenum.                           Comment: Esophagus was not dilated. Moderate Sedation:      Moderate (conscious) sedation was administered by the endoscopy nurse       and supervised by the endoscopist. The following  parameters were       monitored: oxygen saturation, heart rate, blood pressure, CO2       capnography and response to care. Total physician intraservice time was       23 minutes. Recommendation:           - Patient has a contact number available for                            emergencies. The signs and symptoms of potential                            delayed complications were discussed with the                            patient. Return to normal activities tomorrow.                            Written discharge instructions were provided to the                            patient.                           - Mechanical soft diet today.                           - Continue present medications.                           - No aspirin, ibuprofen, naproxen, or other                            non-steroidal anti-inflammatory drugs for 1 day.                           -  Await pathology results.                           - Nystatin suspension 500,000 units PO QID for 10                            weeks. Procedure Code(s):        --- Professional ---                           [phone], Esophagogastroduodenoscopy, flexible,                            transoral; with biopsy, single or multiple                           99153, Moderate sedation; each additional 15                            minutes intraservice time                           G0500, Moderate sedation services provided by the                            same physician or other qualified health care                            professional performing a gastrointestinal                            endoscopic service that sedation supports,                            requiring the presence of an independent trained                            observer to assist in the monitoring of the                            patient's level of consciousness and physiological                            status; initial 15 minutes of intra-service time;                             patient age 78 years or older (additional time may                            be reported with [phone], as appropriate) Diagnosis Code(s):        --- Professional ---                           K22.8, Other specified diseases of esophagus  K44.9, Diaphragmatic hernia without obstruction or                            gangrene                           R13.14, Dysphagia, pharyngoesophageal phase CPT copyright 2019 [employer]. All rights reserved. The codes documented in this report are preliminary and upon coder review may  be revised to meet current compliance requirements. Hildred Laser, MD Hildred Laser, MD 09/29/2020 1:36:45 PM This report has been signed electronically. Number of Addenda: 0

## 2020-09-29 NOTE — H&P (Addendum)
Xavier Livingston is an 64 y.o. male.   Chief Complaint: Patient is here for [facility] gastroduodenoscopy possible esophageal dilation and esophageal biopsy. HPI: Patient is 64 year old Afro-American male history of esophageal dysphagia who did not respond to esophageal dilation.  He was sent over to [facility] for esophageal manometry.  Esophageal manometry had to be performed with esophagogastroduodenoscopy.  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  Patient's autoimmune markers are negative.  He has not responded to prednisone.  He is undergoing repeat evaluation biopsy to see if lymphocytic esophagitis has resolved otherwise would consider other therapies.  Patient states that he has noted some soreness in his throat since he has been on prednisone.  He is down to 10 mg a day.  He is tapering to come off it.  He has gained 5 pounds.  He he is drinking nutritional supplements.  He does all right but liquids but not with solids.  He points to upper sternal area site of bolus obstruction.  Past Medical History:  Diagnosis Date  . Anxiety   . Arthritis   . Complication of anesthesia    diff swallowing,reflx  . Dyspnea   . Dysrhythmia    irregular heartbeat in the 80's  . Enlarged prostate   . GERD (gastroesophageal reflux disease)   . Headache   . Hyperlipemia   . Hypertension   . Nocturia more than twice per night   . Reflux esophagitis   . Seizures ([place])    last at age 10 or 64 years old  . Smoker within last 12 months   . Swallowing difficulty     Past Surgical History:  Procedure Laterality Date  . ANTERIOR CERVICAL DECOMPRESSION/DISCECTOMY FUSION 4 LEVELS N/A 04/11/2015   Procedure: Anterior Cervical Discectomy/ Decompression Fusion Cervical three-four, Cervical four-five, Cervical five-six, Cervical six- seven;  Surgeon: Kary Kos, MD;  Location: [place] [facility];  Service: Neurosurgery;  Laterality: N/A;  . BACK SURGERY    . COLONOSCOPY N/A 08/18/2014    Procedure: COLONOSCOPY;  Surgeon: Rogene Houston, MD;  [facility];  Service: Endoscopy;  Laterality: N/A;  1030-moved to 1200 Ann to notify pt  . ESOPHAGEAL DILATION N/A 11/29/2016   Procedure: ESOPHAGEAL DILATION;  Surgeon: Rogene Houston, MD;  [facility];  Service: Endoscopy;  Laterality: N/A;  . ESOPHAGEAL DILATION N/A 05/16/2017   Procedure: ESOPHAGEAL DILATION;  Surgeon: Rogene Houston, MD;  [facility];  Service: Endoscopy;  Laterality: N/A;  . ESOPHAGEAL DILATION N/A 07/29/2019   Procedure: ESOPHAGEAL DILATION;  Surgeon: Rogene Houston, MD;  [facility];  Service: Endoscopy;  Laterality: N/A;  . ESOPHAGOGASTRODUODENOSCOPY N/A 08/18/2014   Procedure: ESOPHAGOGASTRODUODENOSCOPY (EGD);  Surgeon: Rogene Houston, MD;  [facility];  Service: Endoscopy;  Laterality: N/A;  . ESOPHAGOGASTRODUODENOSCOPY N/A 11/29/2016   Procedure: ESOPHAGOGASTRODUODENOSCOPY (EGD);  Surgeon: Rogene Houston, MD;  [facility];  Service: Endoscopy;  Laterality: N/A;  12:00  . ESOPHAGOGASTRODUODENOSCOPY N/A 07/29/2019   Procedure: ESOPHAGOGASTRODUODENOSCOPY (EGD);  Surgeon: Rogene Houston, MD;  [facility];  Service: Endoscopy;  Laterality: N/A;  2:00  . ESOPHAGOGASTRODUODENOSCOPY (EGD) WITH PROPOFOL N/A 05/16/2017   Procedure: ESOPHAGOGASTRODUODENOSCOPY (EGD) WITH PROPOFOL;  Surgeon: Rogene Houston, MD;  [facility];  Service: Endoscopy;  Laterality: N/A;  8:30  . excision of mass of back    . MALONEY DILATION  08/18/2014   Procedure: MALONEY DILATION;  Surgeon: Rogene Houston, MD;  [facility];  Service: Endoscopy;;  . MASS EXCISION N/A 10/21/2013   Procedure: EXCISION MASSES X 2 ON BACK;  Surgeon: Scherry Ran, MD;  Location: [facility];  Service: General;  Laterality: N/A;  . NASAL SEPTUM SURGERY    . POSTERIOR CERVICAL FUSION/FORAMINOTOMY N/A 02/05/2018   Procedure: Posterior Cervical Fusion with  lateral mass fixation - Cervical six-seven;  Surgeon: Kary Kos, MD;  Location: [place];  Service: Neurosurgery;  Laterality: N/A;  . POSTERIOR CERVICAL FUSION/FORAMINOTOMY N/A 02/06/2019   Procedure: Posterior Cervical Fusion with lateral mass fixation redo C6-T1;  Surgeon: Kary Kos, MD;  Location: [place];  Service: Neurosurgery;  Laterality: N/A;  . SHOULDER ARTHROSCOPY WITH ROTATOR CUFF REPAIR Left 09/01/2019   Procedure: ARTHROSCOPY SHOULDER ROTATOR CUFF REPAIR;  Surgeon: Tania Ade, MD;  Location: [place];  Service: Orthopedics;  Laterality: Left;  . SHOULDER ARTHROSCOPY WITH SUBACROMIAL DECOMPRESSION Left 09/01/2019   Procedure: SHOULDER ARTHROSCOPY WITH SUBACROMIAL DECOMPRESSION;  Surgeon: Tania Ade, MD;  Location: [place];  Service: Orthopedics;  Laterality: Left;    History reviewed. No pertinent family history. Social History:  reports that he quit smoking about 2 years ago. His smoking use included cigarettes and cigars. He has a 10.00 pack-year smoking history. He has never used smokeless tobacco. He reports current alcohol use of about 6.0 standard drinks of alcohol per week. He reports current drug use. Drug: Marijuana.  Allergies: No Known Allergies  Medications Prior to Admission  Medication Sig Dispense Refill  . amLODipine (NORVASC) 5 MG tablet Take 5 mg by mouth daily.     . Ascorbic Acid (VITAMIN C) 100 MG tablet Take 300 mg by mouth daily.    . cholecalciferol (VITAMIN D3) 25 MCG (1000 UT) tablet Take 1,000 Units by mouth daily.    [name] oxyCODONE-acetaminophen (PERCOCET) 5-325 MG tablet Take 1-2 tablets every 4 hours as needed for post operative pain. MAX 6/day (Patient taking differently: Take 1 tablet by mouth every 6 (six) hours as needed for moderate pain. ) 30 tablet 0  . pantoprazole (PROTONIX) 40 MG tablet Take 1 tablet (40 mg total) by mouth daily before breakfast. 30 tablet 5  . pravastatin (PRAVACHOL) 40 MG tablet Take 40  mg by mouth daily.    . predniSONE (DELTASONE) 10 MG tablet Take 10 mg by mouth daily with breakfast.    . tiZANidine (ZANAFLEX) 4 MG tablet Take 1 tablet (4 mg total) by mouth every 8 (eight) hours as needed for muscle spasms. 30 tablet 1  . diltiazem (CARDIZEM SR) 60 MG 12 hr capsule Take 1 capsule (60 mg total) by mouth 2 (two) times daily. (Patient not taking: Reported on 09/21/2020) 60 capsule 3  . esomeprazole (NEXIUM) 40 MG capsule Take 40 mg by mouth daily at 12 noon.      Results for orders placed or performed during the hospital encounter of 09/28/20 (from the past 48 hour(s))  SARS CORONAVIRUS 2 (TAT 6-24 HRS) Nasopharyngeal Nasopharyngeal Swab     Status: None   Collection Time: 09/28/20  8:10 AM   Specimen: Nasopharyngeal Swab  Result Value Ref Range   SARS Coronavirus 2 NEGATIVE NEGATIVE    Comment: (NOTE) SARS-CoV-2 target nucleic acids are NOT DETECTED.  The SARS-CoV-2 RNA is generally detectable in upper and lower respiratory specimens during the acute phase of infection. Negative results do not preclude SARS-CoV-2 infection, do not rule out co-infections with other pathogens, and should not  be used as the sole basis for treatment or other patient management decisions. Negative results must be combined with clinical observations, patient history, and epidemiological information. The expected result is Negative.  Fact Sheet for Patients: SugarRoll.be  Fact Sheet for Healthcare Providers: https://www.woods-mathews.com/  This test is not yet approved or cleared by the [place] FDA and  has been authorized for detection and/or diagnosis of SARS-CoV-2 by FDA under an Emergency Use Authorization (EUA). This EUA will remain  in effect (meaning this test can be used) for the duration of the COVID-19 declaration under Se ction 564(b)(1) of the Act, 21 U.S.C. section 360bbb-3(b)(1), unless the authorization is terminated  or revoked sooner.  Performed at [facility], [place] [address], [place], [place] [address]    No results found.  Review of Systems  Blood pressure (!) 155/99, pulse 66, temperature 98.8 F (37.1 C), temperature source Oral, resp. rate (!) 22, height 5\' 11"  (1.803 m), weight 70.8 kg, SpO2 100 %. Physical Exam HENT:     Mouth/Throat:     Mouth: Mucous membranes are moist.     Comments: Patchy exudate noted over hard palate. Eyes:     General: No scleral icterus.    Conjunctiva/sclera: Conjunctivae normal.  Cardiovascular:     Rate and Rhythm: Normal rate and regular rhythm.     Heart sounds: Normal heart sounds. No murmur heard.   Pulmonary:     Effort: Pulmonary effort is normal.     Breath sounds: Normal breath sounds.  Abdominal:     Comments: Abdomen is symmetrical.  On palpation is soft.  Mild midepigastric tenderness noted.  No organomegaly or masses.  Musculoskeletal:        General: No swelling.     Cervical back: Neck supple.  Lymphadenopathy:     Cervical: No cervical adenopathy.  Skin:    General: Skin is warm and dry.  Neurological:     Mental Status: He is alert.      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 Esophagogastroduodenoscopy with esophageal biopsy and possible esophageal dilation.  Hildred Laser, MD 09/29/2020, 12:46 PM

## 2020-09-29 NOTE — Discharge Instructions (Signed)
No aspirin or NSAIDs for 24 hours. Resume other medications as before.  You can take pantoprazole or Nexium but not both. Mycostatin suspension swish and swallow 4 times a day until prescription runs out. Mechanical soft diet. Physician will call with biopsy results. PREDNISONE 10mg  by mouth daily  for 5 days, then Prednisone 5mg  daily by mouth for 5 days, then stop medication.  Upper Endoscopy, Adult, Care After This sheet gives you information about how to care for yourself after your procedure. Your health care provider may also give you more specific instructions. If you have problems or questions, contact your health care provider. What can I expect after the procedure? After the procedure, it is common to have:  A sore throat.  Mild stomach pain or discomfort.  Bloating.  Nausea. Follow these instructions at home:   Follow instructions from your health care provider about what to eat or drink after your procedure.  Return to your normal activities as told by your health care provider. Ask your health care provider what activities are safe for you.  Take over-the-counter and prescription medicines only as told by your health care provider.  Do not drive for 24 hours if you were given a sedative during your procedure.  Keep all follow-up visits as told by your health care provider. This is important. Contact a health care provider if you have:  A sore throat that lasts longer than one day.  Trouble swallowing. Get help right away if:  You vomit blood or your vomit looks like coffee grounds.  You have: ? A fever. ? Bloody, black, or tarry stools. ? A severe sore throat or you cannot swallow. ? Difficulty breathing. ? Severe pain in your chest or abdomen. Summary  After the procedure, it is common to have a sore throat, mild stomach discomfort, bloating, and nausea.  Do not drive for 24 hours if you were given a sedative during the procedure.  Follow instructions  from your health care provider about what to eat or drink after your procedure.  Return to your normal activities as told by your health care provider. This information is not intended to replace advice given to you by your health care provider. Make sure you discuss any questions you have with your health care provider. Document Revised: 05/13/2018 Document Reviewed: 04/21/2018 Elsevier Patient Education  [place] A soft-food eating plan includes foods that are safe and easy to chew and swallow. Your health care provider or dietitian can help you find foods and flavors that fit into this plan. Follow this plan until your health care provider or dietitian says it is safe to start eating other foods and food textures. What are tips for following this plan? General guidelines   Take small bites of food, or cut food into pieces about  inch or smaller. Bite-sized pieces of food are easier to chew and swallow.  Eat moist foods. Avoid overly dry foods.  Avoid foods that: ? Are difficult to swallow, such as dry, chunky, crispy, or sticky foods. ? Are difficult to chew, such as hard, tough, or stringy foods. ? Contain nuts, seeds, or fruits.  Follow instructions from your dietitian about the types of liquids that are safe for you to swallow. You may be allowed to have: ? Thick liquids only. This includes only liquids that are thicker than honey. ? Thin and thick liquids. This includes all beverages and foods that become liquid at room temperature.  To  make thick liquids: ? Purchase a commercial liquid thickening powder. These are available at grocery stores and pharmacies. ? Mix the thickener into liquids according to instructions on the label. ? Purchase ready-made thickened liquids. ? Thicken soup by pureeing, straining to remove chunks, and adding flour, potato flakes, or corn starch. ? Add commercial thickener to foods that become liquid at room  temperature, such as milk shakes, yogurt, ice cream, gelatin, and sherbet.  Ask your health care provider whether you need to take a fiber supplement. Cooking  Cook meats so they stay tender and moist. Use methods like braising, stewing, or baking in liquid.  Cook vegetables and fruit until they are soft enough to be mashed with a fork.  Peel soft, fresh fruits such as peaches, nectarines, and melons.  When making soup, make sure chunks of meat and vegetables are smaller than  inch.  Reheat leftover foods slowly so that a tough crust does not form. What foods are allowed? The items listed below may not be a complete list. Talk with your dietitian about what dietary choices are best for you. Grains Breads, muffins, pancakes, or waffles moistened with syrup, jelly, or butter. Dry cereals well-moistened with milk. Moist, cooked cereals. Well-cooked pasta and rice. Vegetables All soft-cooked vegetables. Shredded lettuce. Fruits All canned and cooked fruits. Soft, peeled fresh fruits. Strawberries. Dairy Milk. Cream. Yogurt. Cottage cheese. Soft cheese without the rind. Meats and other protein foods Tender, moist ground meat, poultry, or fish. Meat cooked in gravy or sauces. Eggs. Sweets and desserts Ice cream. Milk shakes. Sherbet. Pudding. Fats and oils Butter. Margarine. Olive, canola, sunflower, and grapeseed oil. Smooth salad dressing. Smooth cream cheese. Mayonnaise. Gravy. What foods are not allowed? The items listed bemay not be a complete list. Talk with your dietitian about what dietary choices are best for you. Grains Coarse or dry cereals, such as bran, granola, and shredded wheat. Tough or chewy crusty breads, such as [place] bread or baguettes. Breads with nuts, seeds, or fruit. Vegetables All raw vegetables. Cooked corn. Cooked vegetables that are tough or stringy. Tough, crisp, fried potatoes and potato skins. Fruits Fresh fruits with skins or seeds, or both, such as  apples, pears, and grapes. Stringy, high-pulp fruits, such as papaya, pineapple, coconut, and mango. Fruit leather and all dried fruit. Dairy Yogurt with nuts or coconut. Meats and other protein foods Hard, dry sausages. Dry meat, poultry, or fish. Meats with gristle. Fish with bones. Fried meat or fish. Lunch meat and hotdogs. Nuts and seeds. Chunky peanut butter or other nut butters. Sweets and desserts Cakes or cookies that are very dry or chewy. Desserts with dried fruit, nuts, or coconut. Fried pastries. Very rich pastries. Fats and oils Cream cheese with fruit or nuts. Salad dressings with seeds or chunks. Summary  A soft-food eating plan includes foods that are safe and easy to swallow. Generally, the foods should be soft enough to be mashed with a fork.  Avoid foods that are dry, hard to chew, crunchy, sticky, stringy, or crispy.  Ask your health care provider whether you need to thicken your liquids and if you need to take a fiber supplement. This information is not intended to replace advice given to you by your health care provider. Make sure you discuss any questions you have with your health care provider. Document Revised: 03/12/2019 Document Reviewed: 01/22/2017 Elsevier Patient Education  [place].

## 2020-09-30 LAB — SURGICAL PATHOLOGY

## 2020-10-04 ENCOUNTER — Encounter (HOSPITAL_COMMUNITY): Payer: Self-pay | Admitting: Internal Medicine

## 2020-10-24 ENCOUNTER — Telehealth (INDEPENDENT_AMBULATORY_CARE_PROVIDER_SITE_OTHER): Payer: Self-pay | Admitting: Internal Medicine

## 2020-10-24 NOTE — Telephone Encounter (Signed)
Forwarding message to primary gastroenterologist Dr. Laural Golden  Thanks

## 2020-10-24 NOTE — Telephone Encounter (Signed)
Patient left message stating he has some questions - states his throat is sore and is having difficulty swallowing - please advise - ph# [phone]

## 2020-10-25 NOTE — Telephone Encounter (Signed)
Patient called but no answer. Will try tomorrow.

## 2020-11-01 NOTE — Telephone Encounter (Signed)
Patient called but no answer

## 2020-11-04 NOTE — Telephone Encounter (Signed)
On third attempt I was able to reach patient today. Patient says he had gone to [place] see his daughter who is not feeling well. His swallowing remains the same. He did not feel any better with Mycostatin. I requested Dr. Claudette Laws if he would would be kind enough to obtain esophageal biopsies from [facility] from September 2021 and C fee agrees with diagnosis of lymphocytic esophagitis. In the meantime will refer patient to Dr. Arnaldo Natal of [facility] for evaluation of dysphagia. Ann, please let them know that he had manometry by them and he also has had upper GI study as well as EGD at [facility] as well as by them.

## 2020-11-08 NOTE — Telephone Encounter (Signed)
Referral and notes faxed to [facility], they will contact patient with appt 

## 2020-12-09 LAB — SURGICAL PATHOLOGY

## 2020-12-21 ENCOUNTER — Telehealth (INDEPENDENT_AMBULATORY_CARE_PROVIDER_SITE_OTHER): Payer: Self-pay | Admitting: Internal Medicine

## 2020-12-21 NOTE — Telephone Encounter (Signed)
Patient had EGD 09/29/20 this was for Dysphagia. Biopsy was done. Patient shares that his throat has been sore every since his procedure. He has tried over the counter medications for sore throat and done of these have helped relieve the symptoms. He also shares that it is difficult for foods, medication and liquids to go down.  He says that he was suppose to go to [facility] for further evaluation and this was to have been in January.  Is there anything that the patient may take for his throat in the mean time?  Sent to Dr. Laural Golden for review/recommendation. Also sent to [facility] to follow up on referral to [facility].

## 2020-12-21 NOTE — Telephone Encounter (Signed)
Patient called the office stating he has had a sore throat since his last procedure - would like to know if there is anything he can take for this - please advise - ph# [phone]

## 2020-12-21 NOTE — Telephone Encounter (Signed)
Not sure as to why he still has sore throat.  His esophagus was not dilated when he had EGD back in October 2021. Would recommend he take famotidine OTC 20 mg at bedtime in addition to pantoprazole in the morning. Please find out when he would be seen at [facility] by Dr. Arnaldo Natal for his dysphagia and motility disorder He also needs referral to ENT specialist

## 2020-12-21 NOTE — Telephone Encounter (Signed)
Patient was called and made aware. He states that he has a Building services engineer and will bring the card by today so that a copy of the front and back can be made. Serena Colonel - Dr.Rehman ask that a follow up call be made to [facility] to see when his appointment may be with Dr.Stephen Truddie Crumble and he also request that the patient be sent to ENT for evaluation.

## 2020-12-27 NOTE — Telephone Encounter (Signed)
Routing to [place] to follow up on the Referral

## 2020-12-28 NOTE — Telephone Encounter (Signed)
Referral made to Dr Benjamine Mola - apt 01/03/21 - patient aware Referral sent again for Dr Truddie Crumble @ [facility] - explained to patient he needed to make sure to answer when [facility] calls, they will not continue to call.

## 2021-01-10 ENCOUNTER — Encounter (INDEPENDENT_AMBULATORY_CARE_PROVIDER_SITE_OTHER): Payer: Self-pay | Admitting: Internal Medicine

## 2021-01-10 ENCOUNTER — Ambulatory Visit (INDEPENDENT_AMBULATORY_CARE_PROVIDER_SITE_OTHER): Payer: Medicare Other | Admitting: Internal Medicine

## 2021-01-10 ENCOUNTER — Other Ambulatory Visit: Payer: Self-pay

## 2021-01-10 VITALS — BP 168/85 | HR 80 | Temp 98.7°F | Ht 71.0 in | Wt 174.6 lb

## 2021-01-10 DIAGNOSIS — K224 Dyskinesia of esophagus: Secondary | ICD-10-CM

## 2021-01-10 DIAGNOSIS — I10 Essential (primary) hypertension: Secondary | ICD-10-CM

## 2021-01-10 DIAGNOSIS — K219 Gastro-esophageal reflux disease without esophagitis: Secondary | ICD-10-CM

## 2021-01-10 MED ORDER — AMLODIPINE BESYLATE 5 MG PO TABS
5.0000 mg | ORAL_TABLET | Freq: Every day | ORAL | 3 refills | Status: DC
Start: 2021-01-10 — End: 2022-11-02

## 2021-01-10 MED ORDER — NYSTATIN 100000 UNIT/ML MT SUSP
5.0000 mL | Freq: Four times a day (QID) | OROMUCOSAL | 1 refills | Status: DC
Start: 2021-01-10 — End: 2021-03-14

## 2021-01-10 MED ORDER — PANTOPRAZOLE SODIUM 40 MG PO TBEC
40.0000 mg | DELAYED_RELEASE_TABLET | Freq: Two times a day (BID) | ORAL | 5 refills | Status: DC
Start: 2021-01-10 — End: 2021-03-14

## 2021-01-10 NOTE — Patient Instructions (Signed)
Please call GI clinic to make an appointment with Dr. Roosvelt Maser

## 2021-01-10 NOTE — Progress Notes (Signed)
Presenting complaint;  Throat pain and dysphagia.  Database and subjective:  Patient is 65 year old African-American male who has chronic GERD and history of dysphagia.  He has had dysphagia dating back to 2017 and initially seemed to respond to dilation but not in 2020.  He underwent esophageal manometry at [facility] in September 2021 and he was diagnosed to have nonspecific esophageal motility disorder.  Most of his peristalsis are either weak or nonpropulsive but LES pressure was normal with complete relaxation.  Prior to the study he had been treated with calcium channel blockers but did not feel any better.  He also had a esophageal biopsy at the time of esophageal manometry which was read as lymphocytic esophagitis.  I was there for concerned that he may have autoimmune disease.  Work-up included negative rheumatoid factor, negative AMA negative SSA IgA antibody and SSB IgG antibody.  C3 and C4 complement levels were normal. Since he was losing weight he was treated with prednisone but without symptomatic improvement.  He had repeat EGD with biopsy on 09/29/2020 and Dr. Saralyn Pilar did not see any changes suggest lymphocytic esophagitis.  He was also able to review slides from [facility] and did not feel that path findings were enough to make a diagnosis of lymphocytic esophagitis. Since patient's last EGD he has been complaining of throat pain.  He did have Candida on one of his prior EGDs.  Therapy did not make any difference.  He was referred to Dr. Benjamine Mola who found no abnormality on his evaluation. Patient has been referred to Dr. Roosvelt Maser of [facility] with expertise in esophageal disorders.  Patient said he was not able to go to [facility] because he has been helping his sister who lives in [place] and has been diagnosed with cancer.  He says he has to take her to [facility] for treatment. He states he has just "quit eating red meat.  He tries to stick with soft foods.  He has gained 16 pounds  since his last visit of September 19, 2020.  He states few years ago before he started to lose weight he weighed 205 pounds and he dropped down to 142 pounds. His appetite is good.  He says he walks 10 miles every day weather permitting.  He also complains of neck pain as well as lower back pain.  He has noted that his swallowing gets better after he takes his pain pill in the morning.  He has not had any episode of food impaction.  His bowels move daily.  He denies melena or rectal bleeding. He wants to try nystatin 1 more time to see if it might help.  Current Medications: Outpatient Encounter Medications as of 01/10/2021  Medication Sig  . cholecalciferol (VITAMIN D3) 25 MCG (1000 UT) tablet Take 1,000 Units by mouth daily.  [name] diltiazem (CARDIZEM) 60 MG tablet Take 60 mg by mouth daily.  [name] nystatin (MYCOSTATIN) 100000 UNIT/ML suspension Take 5 mLs (500,000 Units total) by mouth 4 (four) times daily.  [name] oxyCODONE-acetaminophen (PERCOCET) 5-325 MG tablet Take 1-2 tablets every 4 hours as needed for post operative pain. MAX 6/day (Patient taking differently: Take 1 tablet by mouth every 6 (six) hours as needed for moderate pain.)  . pantoprazole (PROTONIX) 40 MG tablet Take 1 tablet (40 mg total) by mouth daily before breakfast.  . pravastatin (PRAVACHOL) 40 MG tablet Take 40 mg by mouth daily.  . predniSONE (DELTASONE) 10 MG tablet Take 10 mg by mouth daily with breakfast.  .  tiZANidine (ZANAFLEX) 4 MG tablet Take 1 tablet (4 mg total) by mouth every 8 (eight) hours as needed for muscle spasms. (Patient not taking: Reported on 01/10/2021)  . [DISCONTINUED] amLODipine (NORVASC) 5 MG tablet Take 5 mg by mouth daily.  (Patient not taking: Reported on 01/10/2021)  . [DISCONTINUED] Ascorbic Acid (VITAMIN C) 100 MG tablet Take 300 mg by mouth daily. (Patient not taking: Reported on 01/10/2021)   No facility-administered encounter medications on file as of 01/10/2021.     Objective: Blood pressure (!) 168/85,  pulse 80, temperature 98.7 F (37.1 C), temperature source Oral, height '5\' 11"'  (1.803 m), weight 174 lb 9.6 oz (79.2 kg). Patient is alert and in no acute distress. He is wearing a mask. Conjunctiva is pink. Sclera is nonicteric Oropharyngeal mucosa is normal. No neck masses or thyromegaly noted. He has upper and lower dentures in place. Cardiac exam with regular rhythm normal S1 and S2. No murmur or gallop noted. Lungs are clear to auscultation. Abdomen is symmetrical and soft on palpation.  He has mild midepigastric tenderness with no guarding or rebound.  No organomegaly or masses. No LE edema or clubbing noted.  Labs/studies Results:  CBC Latest Ref Rng & Units 01/26/2019 02/06/2018 02/05/2018  WBC 4.0 - 10.5 K/uL 6.6 11.6(H) 7.0  Hemoglobin 13.0 - 17.0 g/dL 16.4 14.7 14.1  Hematocrit 39.0 - 52.0 % 51.3 45.1 42.4  Platelets 150 - 400 K/uL 327 310 311    CMP Latest Ref Rng & Units 01/26/2019 02/06/2018 02/05/2018  Glucose 70 - 99 mg/dL 97 135(H) 92  BUN 8 - 23 mg/dL '9 6 9  ' Creatinine 0.61 - 1.24 mg/dL 1.03 1.10 0.98  Sodium 135 - 145 mmol/L 138 140 140  Potassium 3.5 - 5.1 mmol/L 3.9 3.5 3.6  Chloride 98 - 111 mmol/L 105 99(L) 107  CO2 22 - 32 mmol/L '25 27 23  ' Calcium 8.9 - 10.3 mg/dL 9.2 8.8(L) 8.6(L)  Total Protein 6.5 - 8.1 g/dL - - -  Total Bilirubin 0.3 - 1.2 mg/dL - - -  Alkaline Phos 38 - 126 U/L - - -  AST 15 - 41 U/L - - -  ALT 17 - 63 U/L - - -    Hepatic Function Latest Ref Rng & Units 01/20/2018 04/07/2015  Total Protein 6.5 - 8.1 g/dL 7.0 7.4  Albumin 3.5 - 5.0 g/dL 4.1 4.0  AST 15 - 41 U/L 22 22  ALT 17 - 63 U/L 14(L) 12(L)  Alk Phosphatase 38 - 126 U/L 85 98  Total Bilirubin 0.3 - 1.2 mg/dL 0.8 0.3     Assessment:  #1.  GERD.  Heartburn is well controlled with therapy.  #2.  Esophageal motility disorder.  Manometry at [facility] did not reveal changes of achalasia.  Most of his swallows are very weak or absent and reason for his dysphagia.   Work-up for autoimmune diseases was negative and he did not respond to short course of prednisone.  He will try to make another appointment to see Dr. Truddie Crumble at [facility].  #3.  Throat pain felt to be referred from his back pain.  Patient wants to try Mycostatin 1 more time.   Plan:  Mycostatin suspension 500,000 units swish and swallow 4 times a day for 10 days. He will continue pantoprazole at a dose of 40 mg p.o. every morning. Patient will stick with soft foods eat slowly and chew food thoroughly before swallowing.  He should also be sitting upright at  mealtime. Office visit in 6 months.

## 2021-01-26 ENCOUNTER — Telehealth (INDEPENDENT_AMBULATORY_CARE_PROVIDER_SITE_OTHER): Payer: Self-pay | Admitting: *Deleted

## 2021-01-26 NOTE — Telephone Encounter (Signed)
I faxed referral to [facility] for apt with Dr Gae Dry for 3rd time on 12/28/20 - I rfec'd fax today, Mina Marble has cancelled reuqest as they have been unable to contact patient to schedule. The 2 previous referrals have been cancelled as well.. I also had spoken to patient prior to facing 3rd request and explained to him he need to answer when [facility] called and he agreed he would

## 2021-01-30 ENCOUNTER — Other Ambulatory Visit (HOSPITAL_COMMUNITY): Payer: Self-pay | Admitting: Family Medicine

## 2021-01-30 ENCOUNTER — Ambulatory Visit (HOSPITAL_COMMUNITY)
Admission: RE | Admit: 2021-01-30 | Discharge: 2021-01-30 | Disposition: A | Discharge status: Home / Self Care | Payer: 59 | Source: Ambulatory Visit | Attending: Family Medicine | Admitting: Family Medicine

## 2021-01-30 ENCOUNTER — Other Ambulatory Visit: Payer: Self-pay

## 2021-01-30 DIAGNOSIS — M19042 Primary osteoarthritis, left hand: Secondary | ICD-10-CM

## 2021-01-30 DIAGNOSIS — M19032 Primary osteoarthritis, left wrist: Secondary | ICD-10-CM

## 2021-01-31 NOTE — Telephone Encounter (Signed)
I hope he can keep his next appointment

## 2021-03-14 ENCOUNTER — Encounter (INDEPENDENT_AMBULATORY_CARE_PROVIDER_SITE_OTHER): Payer: Self-pay | Admitting: Internal Medicine

## 2021-03-14 ENCOUNTER — Telehealth (INDEPENDENT_AMBULATORY_CARE_PROVIDER_SITE_OTHER): Payer: 59 | Admitting: Internal Medicine

## 2021-03-14 ENCOUNTER — Other Ambulatory Visit: Payer: Self-pay

## 2021-03-14 ENCOUNTER — Other Ambulatory Visit (INDEPENDENT_AMBULATORY_CARE_PROVIDER_SITE_OTHER): Payer: Self-pay

## 2021-03-14 ENCOUNTER — Ambulatory Visit (INDEPENDENT_AMBULATORY_CARE_PROVIDER_SITE_OTHER): Payer: Medicare HMO | Admitting: Internal Medicine

## 2021-03-14 VITALS — Ht 71.0 in | Wt 174.0 lb

## 2021-03-14 DIAGNOSIS — R1319 Other dysphagia: Secondary | ICD-10-CM | POA: Diagnosis not present

## 2021-03-14 DIAGNOSIS — K219 Gastro-esophageal reflux disease without esophagitis: Secondary | ICD-10-CM

## 2021-03-14 MED ORDER — NYSTATIN 100000 UNIT/ML MT SUSP
5.0000 mL | Freq: Four times a day (QID) | OROMUCOSAL | 1 refills | Status: DC
Start: 2021-03-14 — End: 2022-05-31

## 2021-03-14 MED ORDER — PANTOPRAZOLE SODIUM 40 MG PO TBEC
40.0000 mg | DELAYED_RELEASE_TABLET | Freq: Every day | ORAL | 3 refills | Status: DC
Start: 2021-03-14 — End: 2022-05-31

## 2021-03-14 NOTE — Progress Notes (Signed)
Virtual Visit via Telephone Note  I connected with Xavier Livingston on 03/14/21 at  4:15 PM EDT by telephone and verified that I am speaking with the correct person using two identifiers.  Location: Patient: home Provider: office   I discussed the limitations, risks, security and privacy concerns of performing an evaluation and management service by telephone and the availability of in person appointments. I also discussed with the patient that there may be a patient responsible charge related to this service. The patient expressed understanding and agreed to proceed.   History of Present Illness:  Patient is 65 year old African-American male who has chronic GERD and history of dysphagia for which she has undergone extensive work-up.  He has ineffective esophageal peristalsis.  He underwent esophageal manometry at [facility] in September last year.  He had to have endoscopic placement of manometry catheter.  Biopsies suggested lymphocytic esophagitis.  Biochemical markers for scleroderma were negative.  He did not respond to short course of prednisone which resulted in oral candidiasis.  He had repeat EGD with biopsy in October 2021 and esophageal biopsy did not reveal any changes of lymphocytic esophagitis.  I asked Dr. Claudette Laws of: Pathology to review slides from [facility].  He felt few lymphocytes and biopsy was not enough to make a diagnosis of lymphocytic esophagitis. Patient was referred to [facility] but unfortunately he did not answer calls from that facility.  Patient states his sister was undergoing therapy for breast carcinoma and he was helping in a care and taking her to hospital in [place]. He has gained 8 pounds since his last visit.  His baseline weight was over 200 pounds.  He dropped 253 pounds last year but his weight now is coming up. He feels heartburn is well controlled with pantoprazole twice daily.  He denies regurgitation sore throat hoarseness or chronic cough.  He has good  appetite.  He is still bothered by the fact that he cannot eat steak.  He remains on soft foods and soups.  He remains very active.  He states he walks 5 miles a day. He wants to be referred to GI clinic at [facility].    Current Outpatient Medications:  .  amLODipine (NORVASC) 5 MG tablet, Take 1 tablet (5 mg total) by mouth daily., Disp: 90 tablet, Rfl: 3 .  cholecalciferol (VITAMIN D3) 25 MCG (1000 UT) tablet, Take 1,000 Units by mouth daily., Disp: , Rfl:  .  oxyCODONE-acetaminophen (PERCOCET) 5-325 MG tablet, Take 1-2 tablets every 4 hours as needed for post operative pain. MAX 6/day (Patient taking differently: Take 1 tablet by mouth every 6 (six) hours as needed for moderate pain.), Disp: 30 tablet, Rfl: 0 .  pravastatin (PRAVACHOL) 40 MG tablet, Take 40 mg by mouth daily., Disp: , Rfl:  .  nystatin (MYCOSTATIN) 100000 UNIT/ML suspension, Take 5 mLs (500,000 Units total) by mouth 4 (four) times daily., Disp: 240 mL, Rfl: 1 .  pantoprazole (PROTONIX) 40 MG tablet, Take 1 tablet (40 mg total) by mouth twice daily; before breakfast and evening meal.   Medications reviewed with the patient.  Observations/Objective: Patient reported his weight to be 182 pounds. He weighed 174 pounds on 01/10/2019 He weighed 53 pounds on 05/09/2020.  Assessment and Plan:  #1.  Esophageal dysphagia.  He has ineffective esophageal peristalsis.  He has undergone extensive work-up.  He did not respond to empiric esophageal dilation.  Esophageal biopsy at [facility] suggested lymphocytic esophagitis but biochemical markers were autoimmune disorders was negative and he did  not respond to short course of prednisone.  He appears to be at standstill.  It remains to be seen if he would involve into achalasia. He has been referred to Dr. Roosvelt Maser of [facility] but missed calls from his office.  He wants to be rereferred.  #2.  GERD.  Heartburn is well controlled with double dose PPI.  Will try him on single dose and see how he  does.  Plan  Decrease pantoprazole to 40 mg by mouth 30 minutes before breakfast daily. New prescription given for Mycostatin suspension as he has history of oral candidiasis. Will send another request to Dr. Shaune Leeks office for an office visit. Patient will do his best to answer calls from his office. He will return for office visit with me in 4 months.   Follow Up Instructions:    I discussed the assessment and treatment plan with the patient. The patient was provided an opportunity to ask questions and all were answered. The patient agreed with the plan and demonstrated an understanding of the instructions.   The patient was advised to call back or seek an in-person evaluation if the symptoms worsen or if the condition fails to improve as anticipated.  I provided 9 minutes of non-face-to-face time during this encounter.   Hildred Laser, MD

## 2021-03-15 ENCOUNTER — Other Ambulatory Visit (INDEPENDENT_AMBULATORY_CARE_PROVIDER_SITE_OTHER): Payer: Self-pay | Admitting: *Deleted

## 2021-04-12 ENCOUNTER — Ambulatory Visit (INDEPENDENT_AMBULATORY_CARE_PROVIDER_SITE_OTHER): Payer: 59 | Admitting: Orthopedic Surgery

## 2021-04-12 ENCOUNTER — Encounter: Payer: Self-pay | Admitting: Orthopedic Surgery

## 2021-04-12 ENCOUNTER — Other Ambulatory Visit: Payer: Self-pay

## 2021-04-12 VITALS — BP 129/86 | HR 98 | Ht 71.0 in | Wt 174.0 lb

## 2021-04-12 DIAGNOSIS — M67432 Ganglion, left wrist: Secondary | ICD-10-CM

## 2021-04-12 NOTE — Progress Notes (Addendum)
New Patient Visit  Assessment: Xavier Livingston is a 65 y.o. male with the following: Left wrist, dorsal ganglion  Plan: Mr. Andreoli has a dorsal left wrist ganglion cyst.  He has a history of a volar ganglion cyst, that was excised approximately 8 years ago.  Currently, the ganglion is tender and causes some radiating pains distally and proximally.  He is a high risk surgical candidate, and I have recommended aspiration clinic today.  He is in agreement.  Given that he is a high risk surgical candidate, I would plan to continue with aspiration and work to avoid surgery if possible.  Follow-up as needed.  Procedure note injection Left wrist dorsal ganglion cyst aspiration   Verbal consent was obtained to aspirate a left wrist cyst  Timeout was completed to confirm the site of aspiration.  The skin was prepped with alcohol and ethyl chloride was sprayed at the aspiration site.  An 18-gauge needle was used to aspirate the dorsal ganglion cyst.  A thick, gelatinous material was aspirated.  The cyst was adequately decompressed.  Minimal bleeding. There were no complications. A sterile bandage was applied.   Follow-up: Return if symptoms worsen or fail to improve.  Subjective:  Chief Complaint  Patient presents with   Wrist Pain    Pt states nodule on lt wrist came up approx 4 months ago and now he's having soreness in the area. States he does feel pain up and down his left arm.     History of Present Illness: Xavier Livingston is a 65 y.o. male who has been referred to clinic today by Lucia Gaskins, MD for evaluation of left wrist pain and swelling.  He noticed this issue started approximately 4 months ago.  No specific injury.  He does have a history of a volar ganglion cyst that was previously excised.  He did well following the surgery.  The growth on the wrist is currently painful.  It causes some radiating pains both distally and proximally.  No overlying skin changes.  His health is  remained stable.  He does have some pain which radiates from his neck through shoulder all the way to his hand, but he also has a history of multiple cervical spine surgeries, as well as a recent total shoulder arthroplasty.  Currently, he is being worked up for some issues related to his swallowing.   Review of Systems: No fevers or chills No numbness or tingling No chest pain No shortness of breath No bowel or bladder dysfunction No GI distress No headaches   Medical History:  Past Medical History:  Diagnosis Date   Anxiety    Arthritis    Complication of anesthesia    diff swallowing,reflx   Dyspnea    Dysrhythmia    irregular heartbeat in the 80's   Enlarged prostate    GERD (gastroesophageal reflux disease)    Headache    Hyperlipemia    Hypertension    Nocturia more than twice per night    Reflux esophagitis    Seizures (HCC)    last at age 15 or 65 years old   Smoker within last 12 months    Swallowing difficulty     Past Surgical History:  Procedure Laterality Date   ANTERIOR CERVICAL DECOMPRESSION/DISCECTOMY FUSION 4 LEVELS N/A 04/11/2015   Procedure: Anterior Cervical Discectomy/ Decompression Fusion Cervical three-four, Cervical four-five, Cervical five-six, Cervical six- seven;  Surgeon: Kary Kos, MD;  Location: [facility];  Service: Neurosurgery;  Laterality: N/A;  BACK SURGERY     BIOPSY  09/29/2020   Procedure: BIOPSY;  Surgeon: Rogene Houston, MD;  Location: [facility];  Service: Endoscopy;;   COLONOSCOPY N/A 08/18/2014   Procedure: COLONOSCOPY;  Surgeon: Rogene Houston, MD;  [facility];  Service: Endoscopy;  Laterality: N/A;  1030-moved to 1200 Ann to notify pt   ESOPHAGEAL DILATION N/A 11/29/2016   Procedure: ESOPHAGEAL DILATION;  Surgeon: Rogene Houston, MD;  [facility];  Service: Endoscopy;  Laterality: N/A;   ESOPHAGEAL DILATION N/A 05/16/2017   Procedure: ESOPHAGEAL DILATION;  Surgeon: Rogene Houston, MD;   [facility];  Service: Endoscopy;  Laterality: N/A;   ESOPHAGEAL DILATION N/A 07/29/2019   Procedure: ESOPHAGEAL DILATION;  Surgeon: Rogene Houston, MD;  [facility];  Service: Endoscopy;  Laterality: N/A;   ESOPHAGOGASTRODUODENOSCOPY N/A 08/18/2014   Procedure: ESOPHAGOGASTRODUODENOSCOPY (EGD);  Surgeon: Rogene Houston, MD;  [facility];  Service: Endoscopy;  Laterality: N/A;   ESOPHAGOGASTRODUODENOSCOPY N/A 11/29/2016   Procedure: ESOPHAGOGASTRODUODENOSCOPY (EGD);  Surgeon: Rogene Houston, MD;  [facility];  Service: Endoscopy;  Laterality: N/A;  12:00   ESOPHAGOGASTRODUODENOSCOPY N/A 07/29/2019   Procedure: ESOPHAGOGASTRODUODENOSCOPY (EGD);  Surgeon: Rogene Houston, MD;  [facility];  Service: Endoscopy;  Laterality: N/A;  2:00   ESOPHAGOGASTRODUODENOSCOPY N/A 09/29/2020   Procedure: ESOPHAGOGASTRODUODENOSCOPY (EGD);  Surgeon: Rogene Houston, MD;  [facility];  Service: Endoscopy;  Laterality: N/A;  130   ESOPHAGOGASTRODUODENOSCOPY (EGD) WITH PROPOFOL N/A 05/16/2017   Procedure: ESOPHAGOGASTRODUODENOSCOPY (EGD) WITH PROPOFOL;  Surgeon: Rogene Houston, MD;  [facility];  Service: Endoscopy;  Laterality: N/A;  8:30   excision of mass of back     MALONEY DILATION  08/18/2014   Procedure: MALONEY DILATION;  Surgeon: Rogene Houston, MD;  [facility];  Service: Endoscopy;;   MASS EXCISION N/A 10/21/2013   Procedure: EXCISION MASSES X 2 ON BACK;  Surgeon: Scherry Ran, MD;  Location: [facility];  Service: General;  Laterality: N/A;   NASAL SEPTUM SURGERY     POSTERIOR CERVICAL FUSION/FORAMINOTOMY N/A 02/05/2018   Procedure: Posterior Cervical Fusion with lateral mass fixation - Cervical six-seven;  Surgeon: Kary Kos, MD;  Location: [facility];  Service: Neurosurgery;  Laterality: N/A;   POSTERIOR CERVICAL FUSION/FORAMINOTOMY N/A 02/06/2019   Procedure: Posterior Cervical Fusion with lateral mass  fixation redo C6-T1;  Surgeon: Kary Kos, MD;  [facility];  Service: Neurosurgery;  Laterality: N/A;   SHOULDER ARTHROSCOPY WITH ROTATOR CUFF REPAIR Left 09/01/2019   Procedure: ARTHROSCOPY SHOULDER ROTATOR CUFF REPAIR;  Surgeon: Tania Ade, MD;  Location: [facility];  Service: Orthopedics;  Laterality: Left;   SHOULDER ARTHROSCOPY WITH SUBACROMIAL DECOMPRESSION Left 09/01/2019   Procedure: SHOULDER ARTHROSCOPY WITH SUBACROMIAL DECOMPRESSION;  Surgeon: Tania Ade, MD;  Location: [facility];  Service: Orthopedics;  Laterality: Left;    History reviewed. No pertinent family history. Social History   Tobacco Use   Smoking status: Former Smoker    Packs/day: 0.25    Years: 40.00    Pack years: 10.00    Types: Cigarettes, Cigars    Quit date: 01/26/2018    Years since quitting: 3.2   Smokeless tobacco: Never Used   Tobacco comment: cigar 4x per week  Vaping Use   Vaping Use: Never used  Substance Use Topics   Alcohol use: Yes    Alcohol/week: 6.0 standard drinks  Types: 6 Cans of beer per week    Comment: few beers on the weekend   Drug use: Yes    Types: Marijuana    Comment: marijuana 4x per week    No Known Allergies  Current Meds  Medication Sig   amLODipine (NORVASC) 5 MG tablet Take 1 tablet (5 mg total) by mouth daily.   cholecalciferol (VITAMIN D3) 25 MCG (1000 UT) tablet Take 1,000 Units by mouth daily.   nystatin (MYCOSTATIN) 100000 UNIT/ML suspension Take 5 mLs (500,000 Units total) by mouth 4 (four) times daily.   oxyCODONE-acetaminophen (PERCOCET) 5-325 MG tablet Take 1-2 tablets every 4 hours as needed for post operative pain. MAX 6/day (Patient taking differently: Take 1 tablet by mouth every 6 (six) hours as needed for moderate pain.)   pantoprazole (PROTONIX) 40 MG tablet Take 1 tablet (40 mg total) by mouth daily before breakfast.   pravastatin (PRAVACHOL) 40 MG tablet Take 40 mg by mouth daily.    Objective: BP  129/86   Pulse 98   Ht 5\' 11"  (1.803 m)   Wt 174 lb (78.9 kg)   BMI 24.27 kg/m   Physical Exam:  General: Alert and oriented.  No acute distress. Gait: Normal  Evaluation of his left wrist demonstrates a small, 1 cm bump on the dorsal and radial aspect of the wrist.  This is tender to palpation.  The growth is firm but compressible.  Negative Tinel's in this area.  Prescription strength is 5/5.  Sensation is intact in the M/U/R nerve distribution.  Active motion intact in the PIN/AIN/U nerve distribution.  Fingers are warm and well-perfused.  He has a well-healed volar distal wrist surgical incision without surrounding erythema or drainage.    IMAGING: I personally reviewed images previously obtained in clinic   X-rays of the left wrist demonstrates mild degenerative change at the first metacarpal CMC joint and some mild arthrosis of the first second and third digits.   New Medications:  No orders of the defined types were placed in this encounter.     Mordecai Rasmussen, MD  04/12/2021 9:06 AM

## 2021-07-11 ENCOUNTER — Ambulatory Visit (INDEPENDENT_AMBULATORY_CARE_PROVIDER_SITE_OTHER): Payer: 59 | Admitting: Internal Medicine

## 2021-07-18 ENCOUNTER — Ambulatory Visit (INDEPENDENT_AMBULATORY_CARE_PROVIDER_SITE_OTHER): Payer: 59 | Admitting: Internal Medicine

## 2022-01-09 ENCOUNTER — Other Ambulatory Visit (HOSPITAL_COMMUNITY): Payer: Self-pay | Admitting: Neurosurgery

## 2022-01-09 DIAGNOSIS — M542 Cervicalgia: Secondary | ICD-10-CM

## 2022-01-31 ENCOUNTER — Other Ambulatory Visit: Payer: Self-pay

## 2022-01-31 ENCOUNTER — Ambulatory Visit (HOSPITAL_COMMUNITY)
Admission: RE | Admit: 2022-01-31 | Discharge: 2022-01-31 | Disposition: A | Discharge status: Home / Self Care | Payer: Medicare Other | Source: Ambulatory Visit | Attending: Neurosurgery | Admitting: Neurosurgery

## 2022-01-31 DIAGNOSIS — M542 Cervicalgia: Secondary | ICD-10-CM | POA: Diagnosis not present

## 2022-02-20 ENCOUNTER — Other Ambulatory Visit: Payer: Self-pay

## 2022-02-20 ENCOUNTER — Ambulatory Visit (INDEPENDENT_AMBULATORY_CARE_PROVIDER_SITE_OTHER): Payer: Medicare Other | Admitting: Orthopedic Surgery

## 2022-02-20 ENCOUNTER — Encounter: Payer: Self-pay | Admitting: Orthopedic Surgery

## 2022-02-20 VITALS — Ht 71.0 in | Wt 176.0 lb

## 2022-02-20 DIAGNOSIS — M71329 Other bursal cyst, unspecified elbow: Secondary | ICD-10-CM

## 2022-02-20 DIAGNOSIS — M67432 Ganglion, left wrist: Secondary | ICD-10-CM | POA: Diagnosis not present

## 2022-02-20 NOTE — Progress Notes (Signed)
New Patient Visit ? ?Assessment: ?Xavier Livingston is a 66 y.o. male with the following: ?Left wrist, dorsal ganglion; recurrence ? ?Plan: ?Xavier Livingston has a recurrence of the dorsal ganglion of the left wrist.  Currently, it is small.  However, it is causing him some discomfort.  There is no overlying skin changes.  No bruising is appreciated in this area.  I recommended a splint to limit the range of motion, and perhaps allow this pain and discomfort to settle.  However, he should not wear this for longer than 7-10 days.  I also recommended some Voltaren gel.  At the same time, he has a small cystic mass growing over the lateral elbow.  This is overlying the radial-capitellar joint.  He has had this is been larger in the past.  This is also causing him some discomfort.  He should use Voltaren gel as needed. ? ? ?Follow-up: ?Return if symptoms worsen or fail to improve. ? ?Subjective: ? ?Chief Complaint  ?Patient presents with  ? Wrist Pain  ?  Cyst on Lt wrist has returned, pt noticed 4 months ago  ? ? ?History of Present Illness: ?Xavier Livingston is a 66 y.o. male who returns to clinic today for repeat evaluation of a left wrist.  Approximately 10 months ago, he had a cyst aspirated in clinic.  It had done well until a few months ago.  He has noticed some pain and swelling in this area.  It is tender to palpation.  The swelling is not as noticeable as it was when I saw him last.  He has some pain with motion.  He has also noticed some pain and small swelling over the lateral elbow.  This is also tender to palpation.  He says the swelling has been bigger in the past.  He has not noticed any bruising or discoloration in this area. ? ?Review of Systems: ?No fevers or chills ?No numbness or tingling ?No chest pain ?No shortness of breath ?No bowel or bladder dysfunction ?No GI distress ?No headaches ? ? ?Objective: ?Ht '5\' 11"'$  (1.803 m)   Wt 176 lb (79.8 kg)   BMI 24.55 kg/m?  ? ?Physical Exam: ? ?General: Alert and  oriented.  No acute distress. ?Gait: Normal ? ?Left wrist without swelling.  Small growth over the dorsal and radial aspect of the wrist.  No overlying skin changes.  No bruising this area.  This is tender to palpation.  He has some pain in this area with extension and flexion of the wrist.  There is a small, less than 0.5 cm growth over the lateral elbow.  This is tender to palpation.  No overlying skin changes.  No bruising.  No pain with pronation and supination of the forearm. ? ? ?IMAGING: ?I personally reviewed images previously obtained in clinic  ? ?No new imaging today ? ? ?New Medications:  ?No orders of the defined types were placed in this encounter. ? ? ? ? ?Mordecai Rasmussen, MD ? ?02/20/2022 ?10:37 AM ? ? ? ?

## 2022-02-20 NOTE — Patient Instructions (Signed)
Brace on your left wrist for the next 7-10 days ? ?Voltaren gel as needed on your left elbow and left wrist ? ?If cysts continue to grow, can consider removing the fluid. ? ?Monitor skin for redness or worsening swelling. ? ?Contact the clinic with questions ?

## 2022-02-23 ENCOUNTER — Encounter (HOSPITAL_COMMUNITY)
Admission: RE | Admit: 2022-02-23 | Discharge: 2022-02-23 | Disposition: A | Discharge status: Home / Self Care | Payer: Medicare Other | Source: Ambulatory Visit | Attending: Ophthalmology | Admitting: Ophthalmology

## 2022-02-23 NOTE — Pre-Procedure Instructions (Signed)
Attempted pre-op phone call. Left message for him to call us back. ?

## 2022-02-26 ENCOUNTER — Other Ambulatory Visit: Payer: Self-pay

## 2022-02-26 ENCOUNTER — Encounter (HOSPITAL_COMMUNITY): Payer: Self-pay

## 2022-02-26 NOTE — H&P (Signed)
Surgical History & Physical ? ?Patient Name: Xavier Livingston         DOB: 07-30-1956 ? ?Surgery: Cataract extraction with intraocular lens implant phacoemulsification; Right Eye ? ?Surgeon: Baruch Goldmann MD ?Surgery Date:  03-02-22 ?Pre-Op Date:  02-22-22 ? ?HPI: ?A 41 Yr. old male patient presents for a cataract evaluation, referred by Dr. Hassell Done. The patient complains of difficulty when reading fine print, books, newspaper, instructions etc., which began many years ago. Both eyes are affected. The episode is constant and gradual. The condition's severity decreased since last visit. The complaint is associated with blurry vision. The patient enjoys reading books, and states it is become too difficult making it less enjoyable. The patient also reports blurriness when driving, and states he can't see street signs or store signs clearly, and reports poor night vision due to glare. The patient experiences no eye pain, no flashes and no shadow, curtain or veil. The patient reports occasional floaters. The patient is not currently using any eye drops. HPI was performed by Baruch Goldmann . ? ?Medical History: ?Cataracts ?Heart Problem ?High Blood Pressure ?LDL ?esophugus problems ? ?Review of Systems ?Negative Allergic/Immunologic ?Negative Cardiovascular ?Negative Constitutional ?Negative Ear, Nose, Mouth & Throat ?Negative Endocrine ?Negative Eyes ?Negative Gastrointestinal ?Negative Genitourinary ?Negative Hemotologic/Lymphatic ?Negative Integumentary ?Negative Musculoskeletal ?Negative Neurological ?Negative Psychiatry ?Negative Respiratory ? ?Social ?  Former smoker  ? ?Medication ?Meloxicam, Nystatin, Oxycodone, Pantoprazole, BETAMETHASONE VALER, PRAVASTATIN SODIUM, AMLODIPINE BESYLATE, DILTIAZEM,  ? ?Sx/Procedures ?Neck surgery, Left shoulder replacement, Nose surgery, Lipoma Removal,  ? ?Drug Allergies  ? NKDA ? ?History & Physical: ?Heent: Cataract, Right Eye ?NECK: supple without bruits ?LUNGS: lungs clear to  auscultation ?CV: regular rate and rhythm ?Abdomen: soft and non-tender ? ?Impression & Plan: ?Assessment: ?1.  COMBINED FORMS AGE RELATED CATARACT; Both Eyes (H25.813) ?2.  Pinguecula; Both Eyes (H11.153) ?3.  ARCUS SENILIS; Both Eyes (H18.413) ?4.  OAG BORDERLINE FINDINGS LOW RISK; Both Eyes (H40.013) ? ?Plan: 1.  Cataract accounts for the patient's decreased vision. This visual impairment is not correctable with a tolerable change in glasses or contact lenses. Cataract surgery with an implantation of a new lens should significantly improve the visual and functional status of the patient. Discussed all risks, benefits, alternatives, and potential complications. Discussed the procedures and recovery. Patient desires to have surgery. A-scan ordered and performed today for intra-ocular lens calculations. The surgery will be performed in order to improve vision for driving, reading, and for eye examinations. Recommend phacoemulsification with intra-ocular lens. Recommend Dextenza for post-operative pain and inflammation. ?Right Eye worse - first. ?Dilates poorly - shugarcaine by protocol. ?Malyugin Ring. ?Omidira. ? ?2.  Observe; Artificial tears as needed for irritation. ? ?3.  Discussed significance of finding ? ?4.  Based on cup-to-disc ratio. ?OCT rNFL shows: WNL OU. ?IOPs WNL OU. ?

## 2022-03-02 ENCOUNTER — Other Ambulatory Visit: Payer: Self-pay

## 2022-03-02 ENCOUNTER — Ambulatory Visit (HOSPITAL_COMMUNITY): Payer: Medicare Other | Admitting: Anesthesiology

## 2022-03-02 ENCOUNTER — Ambulatory Visit (HOSPITAL_COMMUNITY)
Admission: RE | Admit: 2022-03-02 | Discharge: 2022-03-02 | Disposition: A | Discharge status: Home / Self Care | Payer: Medicare Other | Source: Ambulatory Visit | Attending: Ophthalmology | Admitting: Ophthalmology

## 2022-03-02 ENCOUNTER — Encounter (HOSPITAL_COMMUNITY)
Admission: RE | Disposition: A | Discharge status: Home / Self Care | Payer: Self-pay | Source: Ambulatory Visit | Attending: Ophthalmology

## 2022-03-02 ENCOUNTER — Encounter (HOSPITAL_COMMUNITY): Payer: Self-pay | Admitting: Ophthalmology

## 2022-03-02 ENCOUNTER — Ambulatory Visit (HOSPITAL_BASED_OUTPATIENT_CLINIC_OR_DEPARTMENT_OTHER): Payer: Medicare Other | Admitting: Anesthesiology

## 2022-03-02 DIAGNOSIS — H40013 Open angle with borderline findings, low risk, bilateral: Secondary | ICD-10-CM | POA: Diagnosis not present

## 2022-03-02 DIAGNOSIS — I10 Essential (primary) hypertension: Secondary | ICD-10-CM

## 2022-03-02 DIAGNOSIS — Z87891 Personal history of nicotine dependence: Secondary | ICD-10-CM | POA: Diagnosis not present

## 2022-03-02 DIAGNOSIS — H25811 Combined forms of age-related cataract, right eye: Secondary | ICD-10-CM | POA: Insufficient documentation

## 2022-03-02 DIAGNOSIS — H11153 Pinguecula, bilateral: Secondary | ICD-10-CM | POA: Insufficient documentation

## 2022-03-02 DIAGNOSIS — F419 Anxiety disorder, unspecified: Secondary | ICD-10-CM

## 2022-03-02 DIAGNOSIS — H18413 Arcus senilis, bilateral: Secondary | ICD-10-CM | POA: Diagnosis not present

## 2022-03-02 DIAGNOSIS — G709 Myoneural disorder, unspecified: Secondary | ICD-10-CM

## 2022-03-02 HISTORY — PX: CATARACT EXTRACTION W/PHACO: SHX586

## 2022-03-02 SURGERY — PHACOEMULSIFICATION, CATARACT, WITH IOL INSERTION
Anesthesia: Monitor Anesthesia Care | Site: Eye | Laterality: Right

## 2022-03-02 MED ORDER — LIDOCAINE HCL (PF) 1 % IJ SOLN
INTRAMUSCULAR | Status: DC | PRN
  Administered 2022-03-02: 1 mL via OPHTHALMIC

## 2022-03-02 MED ORDER — PHENYLEPHRINE-KETOROLAC 1-0.3 % IO SOLN
INTRAOCULAR | Status: AC
  Filled 2022-03-02: qty 4

## 2022-03-02 MED ORDER — SODIUM HYALURONATE 23MG/ML IO SOSY
PREFILLED_SYRINGE | INTRAOCULAR | Status: DC | PRN
  Administered 2022-03-02: 0.6 mL via INTRAOCULAR

## 2022-03-02 MED ORDER — TETRACAINE HCL 0.5 % OP SOLN
1.0000 [drp] | OPHTHALMIC | Status: AC | PRN
  Administered 2022-03-02 (×3): 1 [drp] via OPHTHALMIC

## 2022-03-02 MED ORDER — SODIUM HYALURONATE 10 MG/ML IO SOLUTION
PREFILLED_SYRINGE | INTRAOCULAR | Status: DC | PRN
  Administered 2022-03-02: 0.85 mL via INTRAOCULAR

## 2022-03-02 MED ORDER — BSS IO SOLN
INTRAOCULAR | Status: DC | PRN
  Administered 2022-03-02: 500 mL via OPHTHALMIC

## 2022-03-02 MED ORDER — MIDAZOLAM HCL 2 MG/2ML IJ SOLN
INTRAMUSCULAR | Status: AC
  Filled 2022-03-02: qty 2

## 2022-03-02 MED ORDER — POVIDONE-IODINE 5 % OP SOLN
OPHTHALMIC | Status: DC | PRN
  Administered 2022-03-02: 1 via OPHTHALMIC

## 2022-03-02 MED ORDER — LIDOCAINE HCL 3.5 % OP GEL
1.0000 "application " | Freq: Once | OPHTHALMIC | Status: AC
  Administered 2022-03-02: 1 via OPHTHALMIC

## 2022-03-02 MED ORDER — EPINEPHRINE PF 1 MG/ML IJ SOLN
INTRAMUSCULAR | Status: AC
  Filled 2022-03-02: qty 1

## 2022-03-02 MED ORDER — TROPICAMIDE 1 % OP SOLN
1.0000 [drp] | OPHTHALMIC | Status: AC | PRN
  Administered 2022-03-02 (×3): 1 [drp] via OPHTHALMIC

## 2022-03-02 MED ORDER — MIDAZOLAM HCL 2 MG/2ML IJ SOLN
INTRAMUSCULAR | Status: DC | PRN
  Administered 2022-03-02: 2 mg via INTRAVENOUS

## 2022-03-02 MED ORDER — PHENYLEPHRINE HCL 2.5 % OP SOLN
1.0000 [drp] | OPHTHALMIC | Status: AC | PRN
  Administered 2022-03-02 (×3): 1 [drp] via OPHTHALMIC

## 2022-03-02 MED ORDER — BSS IO SOLN
INTRAOCULAR | Status: DC | PRN
  Administered 2022-03-02: 15 mL via INTRAOCULAR

## 2022-03-02 MED ORDER — NEOMYCIN-POLYMYXIN-DEXAMETH 3.5-10000-0.1 OP SUSP
OPHTHALMIC | Status: DC | PRN
  Administered 2022-03-02: 2 [drp] via OPHTHALMIC

## 2022-03-02 MED ORDER — SODIUM CHLORIDE 0.9% FLUSH
INTRAVENOUS | Status: DC | PRN
  Administered 2022-03-02: 5 mL via INTRAVENOUS

## 2022-03-02 MED ORDER — STERILE WATER FOR IRRIGATION IR SOLN
Status: DC | PRN
  Administered 2022-03-02: 250 mL

## 2022-03-02 SURGICAL SUPPLY — 13 items
CATARACT SUITE SIGHTPATH (MISCELLANEOUS) ×2 IMPLANT
CLOTH BEACON ORANGE TIMEOUT ST (SAFETY) ×2 IMPLANT
EYE SHIELD UNIVERSAL CLEAR (GAUZE/BANDAGES/DRESSINGS) ×1 IMPLANT
FEE CATARACT SUITE SIGHTPATH (MISCELLANEOUS) ×1 IMPLANT
GLOVE SURG UNDER POLY LF SZ7 (GLOVE) ×2 IMPLANT
LENS IOL RAYNER 21.5 (Intraocular Lens) ×2 IMPLANT
LENS IOL RAYONE EMV 21.5 (Intraocular Lens) IMPLANT
NDL HYPO 18GX1.5 BLUNT FILL (NEEDLE) ×1 IMPLANT
NEEDLE HYPO 18GX1.5 BLUNT FILL (NEEDLE) ×2 IMPLANT
PAD ARMBOARD 7.5X6 YLW CONV (MISCELLANEOUS) ×2 IMPLANT
SYR TB 1ML LL NO SAFETY (SYRINGE) ×2 IMPLANT
TAPE PAPER 1X10 WHT MICROPORE (GAUZE/BANDAGES/DRESSINGS) ×1 IMPLANT
WATER STERILE IRR 250ML POUR (IV SOLUTION) ×2 IMPLANT

## 2022-03-02 NOTE — Anesthesia Preprocedure Evaluation (Signed)
Anesthesia Evaluation  ?Patient identified by MRN, date of birth, ID band ?Patient awake ? ? ? ?Reviewed: ?Allergy & Precautions, H&P , NPO status , Patient's Chart, lab work & pertinent test results, reviewed documented beta blocker date and time  ? ?Airway ?Mallampati: II ? ?TM Distance: >3 FB ?Neck ROM: full ? ? ? Dental ?no notable dental hx. ? ?  ?Pulmonary ?neg pulmonary ROS, former smoker,  ?  ?Pulmonary exam normal ?breath sounds clear to auscultation ? ? ? ? ? ? Cardiovascular ?Exercise Tolerance: Good ?hypertension, negative cardio ROS ? ? ?Rhythm:regular Rate:Normal ? ? ?  ?Neuro/Psych ? Headaches, Seizures -, Well Controlled,  PSYCHIATRIC DISORDERS Anxiety  Neuromuscular disease   ? GI/Hepatic ?Neg liver ROS, GERD  Medicated,  ?Endo/Other  ?negative endocrine ROS ? Renal/GU ?negative Renal ROS  ?negative genitourinary ?  ?Musculoskeletal ? ? Abdominal ?  ?Peds ? Hematology ?negative hematology ROS ?(+)   ?Anesthesia Other Findings ? ? Reproductive/Obstetrics ?negative OB ROS ? ?  ? ? ? ? ? ? ? ? ? ? ? ? ? ?  ?  ? ? ? ? ? ? ? ? ?Anesthesia Physical ? ?Anesthesia Plan ? ?ASA: 3 ? ?Anesthesia Plan: MAC  ? ?Post-op Pain Management:   ? ?Induction:  ? ?PONV Risk Score and Plan:  ? ?Airway Management Planned:  ? ?Additional Equipment:  ? ?Intra-op Plan:  ? ?Post-operative Plan:  ? ?Informed Consent: I have reviewed the patients History and Physical, chart, labs and discussed the procedure including the risks, benefits and alternatives for the proposed anesthesia with the patient or authorized representative who has indicated his/her understanding and acceptance.  ? ? ? ?Dental Advisory Given ? ?Plan Discussed with: CRNA ? ?Anesthesia Plan Comments:   ? ? ? ? ? ? ?Anesthesia Quick Evaluation ? ?

## 2022-03-02 NOTE — Anesthesia Postprocedure Evaluation (Signed)
Anesthesia Post Note ? ?Patient: Xavier Livingston ? ?Procedure(s) Performed: CATARACT EXTRACTION PHACO AND INTRAOCULAR LENS PLACEMENT (IOC) (Right: Eye) ? ?Patient location during evaluation: Phase II ?Anesthesia Type: MAC ?Level of consciousness: awake ?Pain management: pain level controlled ?Vital Signs Assessment: post-procedure vital signs reviewed and stable ?Respiratory status: spontaneous breathing and respiratory function stable ?Cardiovascular status: blood pressure returned to baseline and stable ?Postop Assessment: no headache and no apparent nausea or vomiting ?Anesthetic complications: no ?Comments: Late entry ? ? ?No notable events documented. ? ? ?Last Vitals:  ?Vitals:  ? 03/02/22 0655 03/02/22 0821  ?BP: (!) 150/101 128/88  ?Pulse:  85  ?Resp:  16  ?Temp:  36.5 ?C  ?SpO2:  94%  ?  ?Last Pain:  ?Vitals:  ? 03/02/22 0821  ?TempSrc: Oral  ?PainSc: 0-No pain  ? ? ?  ?  ?  ?  ?  ?  ? ?Louann Sjogren ? ? ? ? ?

## 2022-03-02 NOTE — Discharge Instructions (Addendum)
Please discharge patient when stable, will follow up today with Dr. Wrzosek at the [facility] [place] office immediately following discharge.  Leave shield in place until visit.  All paperwork with discharge instructions will be given at the office.  [facility] Address:  [address]  [place], [place] [address]  

## 2022-03-02 NOTE — Anesthesia Procedure Notes (Signed)
Date/Time: 03/02/2022 7:57 AM ?Performed by: Vista Deck, CRNA ?Pre-anesthesia Checklist: Patient identified, Emergency Drugs available, Suction available, Timeout performed and Patient being monitored ?Patient Re-evaluated:Patient Re-evaluated prior to induction ?Oxygen Delivery Method: Nasal Cannula ? ? ? ? ?

## 2022-03-02 NOTE — Op Note (Signed)
Date of procedure: 03/02/22 ? ?Pre-operative diagnosis:  Visually significant combined form age-related cataract, Right Eye (H25.811) ? ?Post-operative diagnosis:  Visually significant combined form age-related cataract, Right Eye (H25.811) ? ?Procedure: Removal of cataract via phacoemulsification and insertion of intra-ocular lens Rayner RAO200E +21.5D into the capsular bag of the Right Eye ? ?Attending surgeon: Gerda Diss. Marisa Hua, MD, MA ? ?Anesthesia: MAC, Topical Akten ? ?Complications: None ? ?Estimated Blood Loss: <19m (minimal) ? ?Specimens: None ? ?Implants: As above ? ?Indications:  Visually significant age-related cataract, Right Eye ? ?Procedure:  ?The patient was seen and identified in the pre-operative area. The operative eye was identified and dilated.  The operative eye was marked.  Topical anesthesia was administered to the operative eye.    ? ?The patient was then to the operative suite and placed in the supine position.  A timeout was performed confirming the patient, procedure to be performed, and all other relevant information.   The patient's face was prepped and draped in the usual fashion for intra-ocular surgery.  A lid speculum was placed into the operative eye and the surgical microscope moved into place and focused.  A superotemporal paracentesis was created using a 20 gauge paracentesis blade.  Shugarcaine was injected into the anterior chamber.  Viscoelastic was injected into the anterior chamber.  A temporal clear-corneal main wound incision was created using a 2.459mmicrokeratome.  A continuous curvilinear capsulorrhexis was initiated using an irrigating cystitome and completed using capsulorrhexis forceps.  Hydrodissection and hydrodeliniation were performed.  Viscoelastic was injected into the anterior chamber.  A phacoemulsification handpiece and a chopper as a second instrument were used to remove the nucleus and epinucleus. The irrigation/aspiration handpiece was used to remove any  remaining cortical material.  ? ?The capsular bag was reinflated with viscoelastic, checked, and found to be intact.  The intraocular lens was inserted into the capsular bag.  The irrigation/aspiration handpiece was used to remove any remaining viscoelastic.  The clear corneal wound and paracentesis wounds were then hydrated and checked with Weck-Cels to be watertight.  The lid-speculum was removed.  The drape was removed.  The patient's face was cleaned with a wet and dry 4x4.   Maxitrol was instilled in the eye. A clear shield was taped over the eye. The patient was taken to the post-operative care unit in good condition, having tolerated the procedure well. ? ?Post-Op Instructions: [facility] and will receive all other orders and instructions. ? ?

## 2022-03-02 NOTE — Transfer of Care (Signed)
Immediate Anesthesia Transfer of Care Note ? ?Patient: Xavier Livingston ? ?Procedure(s) Performed: CATARACT EXTRACTION PHACO AND INTRAOCULAR LENS PLACEMENT (IOC) (Right: Eye) ? ?Patient Location: Short Stay ? ?Anesthesia Type:MAC ? ?Level of Consciousness: awake and patient cooperative ? ?Airway & Oxygen Therapy: Patient Spontanous Breathing ? ?Post-op Assessment: Report given to RN and Post -op Vital signs reviewed and stable ? ?Post vital signs: Reviewed and stable ? ?Last Vitals:  ?Vitals Value Taken Time  ?BP 128/88 0820  ?Temp 97.7 0820  ?Pulse 83 0820  ?Resp 18 0820  ?SpO2 94082 0820  ? ? ?Last Pain:  ?Vitals:  ? 03/02/22 0654  ?TempSrc: Oral  ?PainSc: 0-No pain  ?   ? ?  ? ?Complications: No notable events documented. ?

## 2022-03-02 NOTE — Interval H&P Note (Signed)
History and Physical Interval Note: ? ?03/02/2022 ?7:51 AM ? ?Xavier Livingston  has presented today for surgery, with the diagnosis of combined forms age related cataract; right.  The various methods of treatment have been discussed with the patient and family. After consideration of risks, benefits and other options for treatment, the patient has consented to  Procedure(s) with comments: ?CATARACT EXTRACTION PHACO AND INTRAOCULAR LENS PLACEMENT (IOC) (Right) - right as a surgical intervention.  The patient's history has been reviewed, patient examined, no change in status, stable for surgery.  I have reviewed the patient's chart and labs.  Questions were answered to the patient's satisfaction.   ? ? ?Baruch Goldmann ? ? ?

## 2022-03-05 ENCOUNTER — Encounter (HOSPITAL_COMMUNITY): Payer: Self-pay | Admitting: Ophthalmology

## 2022-03-20 ENCOUNTER — Encounter (HOSPITAL_COMMUNITY): Payer: Self-pay

## 2022-03-20 ENCOUNTER — Other Ambulatory Visit: Payer: Self-pay

## 2022-03-20 ENCOUNTER — Encounter (HOSPITAL_COMMUNITY)
Admission: RE | Admit: 2022-03-20 | Discharge: 2022-03-20 | Disposition: A | Discharge status: Home / Self Care | Payer: Medicare Other | Source: Ambulatory Visit | Attending: Ophthalmology | Admitting: Ophthalmology

## 2022-03-21 NOTE — H&P (Signed)
Surgical History & Physical ? ?Patient Name: Xavier Livingston DOB: August 28, 1956 ? ?Surgery: Cataract extraction with intraocular lens implant phacoemulsification; Left Eye ? ?Surgeon: Baruch Goldmann MD ?Surgery Date:  03-23-22 ?Pre-Op Date:  03-15-22 ? ?HPI: ?A 63 Yr. old male patient 1. The patient is returning after cataract surgery. The right eye is affected. Status post cataract surgery, which began 1 week ago: Since the last visit, the affected area feels improvement. The patient's vision is improved. The condition's severity is mild. Patient is following medication instructions. 2. 2. Patient is also continuing having blurry vision in the left eye. There is a significant difference between the two eyes. This is negatively affecting the patient's quality of life and the patient is unable to function adequately in life with the current level of vision. HPI was performed by Baruch Goldmann . ? ?Medical History: ?Cataracts ?Heart Problem ?High Blood Pressure ?LDL ?esophugus problems ? ?Review of Systems ?Negative Allergic/Immunologic ?Negative Cardiovascular ?Negative Constitutional ?Negative Ear, Nose, Mouth & Throat ?Negative Endocrine ?Negative Eyes ?Negative Gastrointestinal ?Negative Genitourinary ?Negative Hemotologic/Lymphatic ?Negative Integumentary ?Negative Musculoskeletal ?Negative Neurological ?Negative Psychiatry ?Negative Respiratory ? ?Social ?  Former smoker ? ?Medication ?Moxifloxacin, Ilevro,  ?Meloxicam, Nystatin, Oxycodone, Pantoprazole, BETAMETHASONE VALER, PRAVASTATIN SODIUM, AMLODIPINE BESYLATE, DILTIAZEM,  ? ?Sx/Procedures ?Phaco c IOL OD,  ?Neck surgery, Left shoulder replacement, Nose surgery, Lipoma Removal,  ? ?Drug Allergies  ? NKDA ? ?History & Physical: ?Heent: Cataract, Left Eye ?NECK: supple without bruits ?LUNGS: lungs clear to auscultation ?CV: regular rate and rhythm ?Abdomen: soft and non-tender ? ?Impression & Plan: ?Assessment: ?1.  CATARACT EXTRACTION STATUS; Right Eye (Z98.41) ?2.   COMBINED FORMS AGE RELATED CATARACT; Left Eye [phone]) ?3.  Myopia ; Right Eye (H52.11) ?4.  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 ?Plan: 1.  1 week after cataract surgery. Doing well with improved vision and normal eye pressure. Call with any problems or concerns. ?Continue Pred-Moxi-Brom 2x/day for 3 more weeks. ? ?2.  Surgery required to correct imbalance of vision. ?Dilates poorly - shugarcaine by protocol. ?Malyugin Ring. ?Omidira. ?Cataract accounts for the patient's decreased vision. This visual impairment is not correctable with a tolerable change in glasses or contact lenses. Cataract surgery with an implantation of a new lens should significantly improve the visual and functional status of the patient. Discussed all risks, benefits, alternatives, and potential complications. Discussed the procedures and recovery. Patient desires to have surgery. A-scan ordered and performed today for intra-ocular lens calculations. The surgery will be performed in order to improve vision for driving, reading, and for eye examinations. Recommend phacoemulsification with intra-ocular lens. Recommend Dextenza for post-operative pain and inflammation. ?Left Eye. ? ?3.  ? ?4.  Asymptomatic, recommend observation for now. Findings, prognosis and treatment options reviewed. ?

## 2022-03-23 ENCOUNTER — Ambulatory Visit (HOSPITAL_BASED_OUTPATIENT_CLINIC_OR_DEPARTMENT_OTHER): Payer: Medicare Other | Admitting: Anesthesiology

## 2022-03-23 ENCOUNTER — Ambulatory Visit (HOSPITAL_COMMUNITY)
Admission: RE | Admit: 2022-03-23 | Discharge: 2022-03-23 | Disposition: A | Discharge status: Home / Self Care | Payer: Medicare Other | Source: Ambulatory Visit | Attending: Ophthalmology | Admitting: Ophthalmology

## 2022-03-23 ENCOUNTER — Encounter (HOSPITAL_COMMUNITY): Payer: Self-pay | Admitting: Ophthalmology

## 2022-03-23 ENCOUNTER — Encounter (HOSPITAL_COMMUNITY)
Admission: RE | Disposition: A | Discharge status: Home / Self Care | Payer: Self-pay | Source: Ambulatory Visit | Attending: Ophthalmology

## 2022-03-23 ENCOUNTER — Ambulatory Visit (HOSPITAL_COMMUNITY): Payer: Medicare Other | Admitting: Anesthesiology

## 2022-03-23 DIAGNOSIS — I10 Essential (primary) hypertension: Secondary | ICD-10-CM | POA: Insufficient documentation

## 2022-03-23 DIAGNOSIS — H25812 Combined forms of age-related cataract, left eye: Secondary | ICD-10-CM | POA: Diagnosis present

## 2022-03-23 DIAGNOSIS — Z79899 Other long term (current) drug therapy: Secondary | ICD-10-CM | POA: Diagnosis not present

## 2022-03-23 DIAGNOSIS — K219 Gastro-esophageal reflux disease without esophagitis: Secondary | ICD-10-CM | POA: Diagnosis not present

## 2022-03-23 DIAGNOSIS — Z87891 Personal history of nicotine dependence: Secondary | ICD-10-CM | POA: Diagnosis not present

## 2022-03-23 DIAGNOSIS — F419 Anxiety disorder, unspecified: Secondary | ICD-10-CM | POA: Insufficient documentation

## 2022-03-23 DIAGNOSIS — G709 Myoneural disorder, unspecified: Secondary | ICD-10-CM

## 2022-03-23 HISTORY — PX: CATARACT EXTRACTION W/PHACO: SHX586

## 2022-03-23 SURGERY — PHACOEMULSIFICATION, CATARACT, WITH IOL INSERTION
Anesthesia: Monitor Anesthesia Care | Site: Eye | Laterality: Left

## 2022-03-23 MED ORDER — FENTANYL CITRATE (PF) 100 MCG/2ML IJ SOLN
INTRAMUSCULAR | Status: AC
  Filled 2022-03-23: qty 2

## 2022-03-23 MED ORDER — SODIUM HYALURONATE 23MG/ML IO SOSY
PREFILLED_SYRINGE | INTRAOCULAR | Status: DC | PRN
  Administered 2022-03-23: 0.6 mL via INTRAOCULAR

## 2022-03-23 MED ORDER — MIDAZOLAM HCL 5 MG/5ML IJ SOLN
INTRAMUSCULAR | Status: DC | PRN
Start: 2022-03-23 — End: 2022-03-23
  Administered 2022-03-23: 2 mg via INTRAVENOUS

## 2022-03-23 MED ORDER — PHENYLEPHRINE-KETOROLAC 1-0.3 % IO SOLN
INTRAOCULAR | Status: DC | PRN
  Administered 2022-03-23: 500 mL via OPHTHALMIC

## 2022-03-23 MED ORDER — PHENYLEPHRINE HCL 2.5 % OP SOLN
1.0000 [drp] | OPHTHALMIC | Status: AC | PRN
  Administered 2022-03-23 (×3): 1 [drp] via OPHTHALMIC

## 2022-03-23 MED ORDER — SODIUM CHLORIDE 0.9% FLUSH
INTRAVENOUS | Status: DC | PRN
  Administered 2022-03-23 (×2): 3 mL via INTRAVENOUS

## 2022-03-23 MED ORDER — FENTANYL CITRATE (PF) 100 MCG/2ML IJ SOLN
INTRAMUSCULAR | Status: DC | PRN
  Administered 2022-03-23: 50 ug via INTRAVENOUS

## 2022-03-23 MED ORDER — MIDAZOLAM HCL 2 MG/2ML IJ SOLN
INTRAMUSCULAR | Status: AC
  Filled 2022-03-23: qty 2

## 2022-03-23 MED ORDER — PHENYLEPHRINE-KETOROLAC 1-0.3 % IO SOLN
INTRAOCULAR | Status: AC
  Filled 2022-03-23: qty 4

## 2022-03-23 MED ORDER — TETRACAINE HCL 0.5 % OP SOLN
1.0000 [drp] | OPHTHALMIC | Status: AC | PRN
  Administered 2022-03-23 (×3): 1 [drp] via OPHTHALMIC

## 2022-03-23 MED ORDER — NEOMYCIN-POLYMYXIN-DEXAMETH 3.5-10000-0.1 OP SUSP
OPHTHALMIC | Status: DC | PRN
  Administered 2022-03-23: 2 [drp] via OPHTHALMIC

## 2022-03-23 MED ORDER — LIDOCAINE HCL 3.5 % OP GEL
1.0000 "application " | Freq: Once | OPHTHALMIC | Status: AC
  Administered 2022-03-23: 1 via OPHTHALMIC

## 2022-03-23 MED ORDER — STERILE WATER FOR IRRIGATION IR SOLN
Status: DC | PRN
  Administered 2022-03-23: 250 mL

## 2022-03-23 MED ORDER — SODIUM HYALURONATE 10 MG/ML IO SOLUTION
PREFILLED_SYRINGE | INTRAOCULAR | Status: DC | PRN
Start: 2022-03-23 — End: 2022-03-23
  Administered 2022-03-23: 0.85 mL via INTRAOCULAR

## 2022-03-23 MED ORDER — TROPICAMIDE 1 % OP SOLN
1.0000 [drp] | OPHTHALMIC | Status: AC | PRN
  Administered 2022-03-23 (×3): 1 [drp] via OPHTHALMIC

## 2022-03-23 MED ORDER — BSS IO SOLN
INTRAOCULAR | Status: DC | PRN
  Administered 2022-03-23: 15 mL via INTRAOCULAR

## 2022-03-23 MED ORDER — EPINEPHRINE PF 1 MG/ML IJ SOLN
INTRAOCULAR | Status: DC | PRN
  Administered 2022-03-23: 1 mL via OPHTHALMIC

## 2022-03-23 MED ORDER — POVIDONE-IODINE 5 % OP SOLN
OPHTHALMIC | Status: DC | PRN
  Administered 2022-03-23: 1 via OPHTHALMIC

## 2022-03-23 SURGICAL SUPPLY — 19 items
CATARACT SUITE SIGHTPATH (MISCELLANEOUS) ×2 IMPLANT
CLOTH BEACON ORANGE TIMEOUT ST (SAFETY) ×2 IMPLANT
EYE SHIELD UNIVERSAL CLEAR (GAUZE/BANDAGES/DRESSINGS) ×1 IMPLANT
FEE CATARACT SUITE SIGHTPATH (MISCELLANEOUS) ×1 IMPLANT
GLOVE BIOGEL PI IND STRL 7.0 (GLOVE) ×2 IMPLANT
GLOVE BIOGEL PI IND STRL 8 (GLOVE) IMPLANT
GLOVE BIOGEL PI INDICATOR 7.0 (GLOVE) ×2
GLOVE BIOGEL PI INDICATOR 8 (GLOVE) ×1
GOWN STRL REUS W/TWL XL LVL3 (GOWN DISPOSABLE) ×1 IMPLANT
LENS IOL RAYNER 21.5 (Intraocular Lens) ×2 IMPLANT
LENS IOL RAYONE EMV 21.5 (Intraocular Lens) IMPLANT
NDL HYPO 18GX1.5 BLUNT FILL (NEEDLE) ×1 IMPLANT
NEEDLE HYPO 18GX1.5 BLUNT FILL (NEEDLE) ×2 IMPLANT
PAD ARMBOARD 7.5X6 YLW CONV (MISCELLANEOUS) ×2 IMPLANT
RING MALYGIN 7.0 (MISCELLANEOUS) IMPLANT
SYR TB 1ML LL NO SAFETY (SYRINGE) ×2 IMPLANT
TAPE SURG TRANSPORE 1 IN (GAUZE/BANDAGES/DRESSINGS) IMPLANT
TAPE SURGICAL TRANSPORE 1 IN (GAUZE/BANDAGES/DRESSINGS) ×1
WATER STERILE IRR 250ML POUR (IV SOLUTION) ×2 IMPLANT

## 2022-03-23 NOTE — Anesthesia Preprocedure Evaluation (Signed)
Anesthesia Evaluation  ?Patient identified by MRN, date of birth, ID band ?Patient awake ? ? ? ?Reviewed: ?Allergy & Precautions, H&P , NPO status , Patient's Chart, lab work & pertinent test results, reviewed documented beta blocker date and time  ? ?Airway ?Mallampati: II ? ?TM Distance: >3 FB ?Neck ROM: full ? ? ? Dental ?no notable dental hx. ? ?  ?Pulmonary ?neg pulmonary ROS, former smoker,  ?  ?Pulmonary exam normal ?breath sounds clear to auscultation ? ? ? ? ? ? Cardiovascular ?Exercise Tolerance: Good ?hypertension, negative cardio ROS ? ? ?Rhythm:regular Rate:Normal ? ? ?  ?Neuro/Psych ? Headaches, Seizures -, Well Controlled,  PSYCHIATRIC DISORDERS Anxiety  Neuromuscular disease   ? GI/Hepatic ?Neg liver ROS, GERD  Medicated,  ?Endo/Other  ?negative endocrine ROS ? Renal/GU ?negative Renal ROS  ?negative genitourinary ?  ?Musculoskeletal ? ? Abdominal ?  ?Peds ? Hematology ?negative hematology ROS ?(+)   ?Anesthesia Other Findings ? ? Reproductive/Obstetrics ?negative OB ROS ? ?  ? ? ? ? ? ? ? ? ? ? ? ? ? ?  ?  ? ? ? ? ? ? ? ? ?Anesthesia Physical ? ?Anesthesia Plan ? ?ASA: 3 ? ?Anesthesia Plan: MAC  ? ?Post-op Pain Management:   ? ?Induction:  ? ?PONV Risk Score and Plan:  ? ?Airway Management Planned:  ? ?Additional Equipment:  ? ?Intra-op Plan:  ? ?Post-operative Plan:  ? ?Informed Consent: I have reviewed the patients History and Physical, chart, labs and discussed the procedure including the risks, benefits and alternatives for the proposed anesthesia with the patient or authorized representative who has indicated his/her understanding and acceptance.  ? ? ? ?Dental Advisory Given ? ?Plan Discussed with: CRNA ? ?Anesthesia Plan Comments:   ? ? ? ? ? ? ?Anesthesia Quick Evaluation ? ?

## 2022-03-23 NOTE — Discharge Instructions (Addendum)
Please discharge patient when stable, will follow up today with Dr. Wrzosek at the [facility] [place] office immediately following discharge.  Leave shield in place until visit.  All paperwork with discharge instructions will be given at the office.   [facility] Address:  [address]  [place], [place] [address]  

## 2022-03-23 NOTE — Op Note (Signed)
Date of procedure: 03/23/22 ? ?Pre-operative diagnosis: Visually significant age-related combined cataract, Left Eye (H25.812) ? ?Post-operative diagnosis: Visually significant age-related combined cataract, Left Eye (H25.812) ? ?Procedure: Removal of cataract via phacoemulsification and insertion of intra-ocular lens Rayner RAO200E +21.5D into the capsular bag of the Left Eye ? ?Attending surgeon: Gerda Diss. Marisa Hua, MD, MA ? ?Anesthesia: MAC, Topical Akten ? ?Complications: None ? ?Estimated Blood Loss: <32m (minimal) ? ?Specimens: None ? ?Implants: As above ? ?Indications:  Visually significant age-related cataract, Left Eye ? ?Procedure:  ?The patient was seen and identified in the pre-operative area. The operative eye was identified and dilated.  The operative eye was marked.  Topical anesthesia was administered to the operative eye.    ? ?The patient was then to the operative suite and placed in the supine position.  A timeout was performed confirming the patient, procedure to be performed, and all other relevant information.   The patient's face was prepped and draped in the usual fashion for intra-ocular surgery.  A lid speculum was placed into the operative eye and the surgical microscope moved into place and focused.  An inferotemporal paracentesis was created using a 20 gauge paracentesis blade.  Shugarcaine was injected into the anterior chamber.  Viscoelastic was injected into the anterior chamber.  A temporal clear-corneal main wound incision was created using a 2.469mmicrokeratome.  A continuous curvilinear capsulorrhexis was initiated using an irrigating cystitome and completed using capsulorrhexis forceps.  Hydrodissection and hydrodeliniation were performed.  Viscoelastic was injected into the anterior chamber.  A phacoemulsification handpiece and a chopper as a second instrument were used to remove the nucleus and epinucleus. The irrigation/aspiration handpiece was used to remove any remaining  cortical material.  ? ?The capsular bag was reinflated with viscoelastic, checked, and found to be intact.  The intraocular lens was inserted into the capsular bag.  The irrigation/aspiration handpiece was used to remove any remaining viscoelastic.  The clear corneal wound and paracentesis wounds were then hydrated and checked with Weck-Cels to be watertight.  Maxitrol was instilled in the eye. The lid-speculum was removed.  The drape was removed.  The patient's face was cleaned with a wet and dry 4x4.    A clear shield was taped over the eye. The patient was taken to the post-operative care unit in good condition, having tolerated the procedure well. ? ?Post-Op Instructions: [facility] and will receive all other orders and instructions. ? ?

## 2022-03-23 NOTE — Interval H&P Note (Signed)
History and Physical Interval Note: ? ?03/23/2022 ?12:34 PM ? ?Xavier Livingston  has presented today for surgery, with the diagnosis of combined forms age related cataract; left.  The various methods of treatment have been discussed with the patient and family. After consideration of risks, benefits and other options for treatment, the patient has consented to  Procedure(s) with comments: ?CATARACT EXTRACTION PHACO AND INTRAOCULAR LENS PLACEMENT (IOC) (Left) - CDE:  as a surgical intervention.  The patient's history has been reviewed, patient examined, no change in status, stable for surgery.  I have reviewed the patient's chart and labs.  Questions were answered to the patient's satisfaction.   ? ? ?Baruch Goldmann ? ? ?

## 2022-03-23 NOTE — Transfer of Care (Signed)
Immediate Anesthesia Transfer of Care Note ? ?Patient: Xavier Livingston ? ?Procedure(s) Performed: CATARACT EXTRACTION PHACO AND INTRAOCULAR LENS PLACEMENT (IOC) (Left: Eye) ? ?Patient Location: Short Stay ? ?Anesthesia Type:MAC ? ?Level of Consciousness: awake ? ?Airway & Oxygen Therapy: Patient Spontanous Breathing ? ?Post-op Assessment: Report given to RN ? ?Post vital signs: Reviewed and stable ? ?Last Vitals:  ?Vitals Value Taken Time  ?BP    ?Temp    ?Pulse    ?Resp    ?SpO2    ? ? ?Last Pain:  ?Vitals:  ? 03/23/22 1155  ?TempSrc: Oral  ?   ? ?Patients Stated Pain Goal: 7 (03/23/22 1155) ? ?Complications: No notable events documented. ?

## 2022-03-23 NOTE — Anesthesia Postprocedure Evaluation (Signed)
Anesthesia Post Note ? ?Patient: Xavier Livingston ? ?Procedure(s) Performed: CATARACT EXTRACTION PHACO AND INTRAOCULAR LENS PLACEMENT (IOC) (Left: Eye) ? ?Patient location during evaluation: Short Stay ?Anesthesia Type: MAC ?Level of consciousness: awake and alert ?Pain management: pain level controlled ?Vital Signs Assessment: post-procedure vital signs reviewed and stable ?Respiratory status: spontaneous breathing ?Cardiovascular status: blood pressure returned to baseline and stable ?Anesthetic complications: no ? ? ?No notable events documented. ? ? ?Last Vitals:  ?Vitals:  ? 03/23/22 1155 03/23/22 1304  ?BP: (!) 149/87 (!) 128/94  ?Pulse:  85  ?Resp: 18 18  ?Temp: 36.7 ?C 36.7 ?C  ?SpO2: 100% 94%  ?  ?Last Pain:  ?Vitals:  ? 03/23/22 1304  ?TempSrc: Oral  ?PainSc: 0-No pain  ? ? ?  ?  ?  ?  ?  ?  ? ?Marvis Bakken ? ? ? ? ?

## 2022-03-26 ENCOUNTER — Encounter (HOSPITAL_COMMUNITY): Payer: Self-pay | Admitting: Ophthalmology

## 2022-05-04 ENCOUNTER — Encounter: Payer: Self-pay | Admitting: Family Medicine

## 2022-05-04 ENCOUNTER — Encounter (INDEPENDENT_AMBULATORY_CARE_PROVIDER_SITE_OTHER): Payer: Self-pay | Admitting: *Deleted

## 2022-05-04 ENCOUNTER — Ambulatory Visit (INDEPENDENT_AMBULATORY_CARE_PROVIDER_SITE_OTHER): Payer: Medicare Other | Admitting: Family Medicine

## 2022-05-04 VITALS — BP 138/76 | HR 91 | Ht 71.0 in | Wt 174.8 lb

## 2022-05-04 DIAGNOSIS — I10 Essential (primary) hypertension: Secondary | ICD-10-CM

## 2022-05-04 DIAGNOSIS — R7301 Impaired fasting glucose: Secondary | ICD-10-CM

## 2022-05-04 DIAGNOSIS — Z23 Encounter for immunization: Secondary | ICD-10-CM | POA: Diagnosis not present

## 2022-05-04 DIAGNOSIS — G8929 Other chronic pain: Secondary | ICD-10-CM | POA: Diagnosis not present

## 2022-05-04 DIAGNOSIS — Z1159 Encounter for screening for other viral diseases: Secondary | ICD-10-CM

## 2022-05-04 DIAGNOSIS — R1310 Dysphagia, unspecified: Secondary | ICD-10-CM

## 2022-05-04 DIAGNOSIS — E559 Vitamin D deficiency, unspecified: Secondary | ICD-10-CM | POA: Diagnosis not present

## 2022-05-04 DIAGNOSIS — M67431 Ganglion, right wrist: Secondary | ICD-10-CM

## 2022-05-04 MED ORDER — OXYCODONE-ACETAMINOPHEN 10-325 MG PO TABS: 1.0000 | ORAL_TABLET | Freq: Three times a day (TID) | ORAL | 0 refills | Status: AC | PRN

## 2022-05-04 NOTE — Progress Notes (Addendum)
New Patient Office Visit  Subjective:  Patient ID: Xavier Livingston, male    DOB: [birth year]  Age: 66 y.o. MRN: [id number]  CC:  Chief Complaint  Patient presents with   New Patient (Initial Visit)    Pt establishing care today, previously seeing [facility] but are not in network any more. Complains of back pain due to back surgery from last year.    Wrist Pain    Complains of having a cyst onset 03/04/22.    HPI SHA AMER is a 66 y.o. male with past medical history of essential hypertension, esophageal dysphagia, dysphagia presents for establishing care. Back pain: Had back surgery in Sept 2019 and reported intermittent back pain. Pain is rated 8/10 and non-radiating. Duration for 3-4 days, and then it subsides with no bowel and bladder incontinence. Patient takes Percocet with moderate relief. He has chronic pain from neck, back, and shoulder surgery.  Dysphagia: Dysphagia x >20 yrs. C/o of food sticking or getting caught in the base of your throat/ chest. He has to beat on the chest to get the food down. He says it can be liquids and solids. He has had 7 to 8 dilations of the esophagus, which provides only temporary relief. He reports seeing Dr. Dorien Chihuahua in the past and would like a referral to see  Dr. Dorien Chihuahua.  Ganglion Cyst: onset of symptoms was a 3 month ago on the left wrist. New symptoms around April 2023. He reports intermittent pain and swelling with pain with flexion. He reported using the splint provided by the orthopedics on 02/20/22 for a short time. There is limited ROM of the affected arm with swelling and pain.He reports numbness and tingling in the left arm. He is following up with ortho care today concerning his new complaint.   Tdap and Shingles given today. Past Medical History:  Diagnosis Date   Anxiety    Arthritis    Complication of anesthesia    diff swallowing,reflx   Dyspnea    Dysrhythmia    irregular heartbeat in the 80's   Enlarged prostate     GERD (gastroesophageal reflux disease)    Headache    Hyperlipemia    Hypertension    Nocturia more than twice per night    Reflux esophagitis    Seizures (HCC)    last at age 72 or 66 years old   Smoker within last 12 months    Swallowing difficulty     Past Surgical History:  Procedure Laterality Date   ANTERIOR CERVICAL DECOMPRESSION/DISCECTOMY FUSION 4 LEVELS N/A 04/11/2015   Procedure: Anterior Cervical Discectomy/ Decompression Fusion Cervical three-four, Cervical four-five, Cervical five-six, Cervical six- seven;  Surgeon: Kary Kos, MD;  Location: [facility];  Service: Neurosurgery;  Laterality: N/A;   BACK SURGERY     BACK SURGERY  08/2018   BIOPSY  09/29/2020   Procedure: BIOPSY;  Surgeon: Rogene Houston, MD;  Location: [facility];  Service: Endoscopy;;   CATARACT EXTRACTION W/PHACO Right 03/02/2022   Procedure: CATARACT EXTRACTION PHACO AND INTRAOCULAR LENS PLACEMENT ([place]);  Surgeon: Baruch Goldmann, MD;  Location: [facility];  Service: Ophthalmology;  Laterality: Right;  CDE: 9.02   CATARACT EXTRACTION W/PHACO Left 03/23/2022   Procedure: CATARACT EXTRACTION PHACO AND INTRAOCULAR LENS PLACEMENT ([facility]);  Surgeon: Baruch Goldmann, MD;  Location: [facility];  Service: Ophthalmology;  Laterality: Left;  CDE: 7.68   COLONOSCOPY N/A 08/18/2014   Procedure: COLONOSCOPY;  Surgeon: Rogene Houston, MD;  [facility]: [facility];  Service: Endoscopy;  Laterality: N/A;  1030-moved to 1200 Ann to notify pt   ESOPHAGEAL DILATION N/A 11/29/2016   Procedure: ESOPHAGEAL DILATION;  Surgeon: Rogene Houston, MD;  [facility];  Service: Endoscopy;  Laterality: N/A;   ESOPHAGEAL DILATION N/A 05/16/2017   Procedure: ESOPHAGEAL DILATION;  Surgeon: Rogene Houston, MD;  [facility];  Service: Endoscopy;  Laterality: N/A;   ESOPHAGEAL DILATION N/A 07/29/2019   Procedure: ESOPHAGEAL DILATION;  Surgeon: Rogene Houston, MD;  [facility];  Service: Endoscopy;   Laterality: N/A;   ESOPHAGOGASTRODUODENOSCOPY N/A 08/18/2014   Procedure: ESOPHAGOGASTRODUODENOSCOPY (EGD);  Surgeon: Rogene Houston, MD;  [facility];  Service: Endoscopy;  Laterality: N/A;   ESOPHAGOGASTRODUODENOSCOPY N/A 11/29/2016   Procedure: ESOPHAGOGASTRODUODENOSCOPY (EGD);  Surgeon: Rogene Houston, MD;  [facility];  Service: Endoscopy;  Laterality: N/A;  12:00   ESOPHAGOGASTRODUODENOSCOPY N/A 07/29/2019   Procedure: ESOPHAGOGASTRODUODENOSCOPY (EGD);  Surgeon: Rogene Houston, MD;  [facility];  Service: Endoscopy;  Laterality: N/A;  2:00   ESOPHAGOGASTRODUODENOSCOPY N/A 09/29/2020   Procedure: ESOPHAGOGASTRODUODENOSCOPY (EGD);  Surgeon: Rogene Houston, MD;  [facility];  Service: Endoscopy;  Laterality: N/A;  130   ESOPHAGOGASTRODUODENOSCOPY (EGD) WITH PROPOFOL N/A 05/16/2017   Procedure: ESOPHAGOGASTRODUODENOSCOPY (EGD) WITH PROPOFOL;  Surgeon: Rogene Houston, MD;  [facility];  Service: Endoscopy;  Laterality: N/A;  8:30   excision of mass of back     MALONEY DILATION  08/18/2014   Procedure: MALONEY DILATION;  Surgeon: Rogene Houston, MD;  [facility];  Service: Endoscopy;;   MASS EXCISION N/A 10/21/2013   Procedure: EXCISION MASSES X 2 ON BACK;  Surgeon: Scherry Ran, MD;  Location: [facility];  Service: General;  Laterality: N/A;   NASAL SEPTUM SURGERY     NECK SURGERY  2019   NECK SURGERY  2020   POSTERIOR CERVICAL FUSION/FORAMINOTOMY N/A 02/05/2018   Procedure: Posterior Cervical Fusion with lateral mass fixation - Cervical six-seven;  Surgeon: Kary Kos, MD;  Location: [place];  Service: Neurosurgery;  Laterality: N/A;   POSTERIOR CERVICAL FUSION/FORAMINOTOMY N/A 02/06/2019   Procedure: Posterior Cervical Fusion with lateral mass fixation redo C6-T1;  Surgeon: Kary Kos, MD;  Location: [facility];  Service: Neurosurgery;  Laterality: N/A;   SHOULDER ARTHROSCOPY WITH ROTATOR CUFF REPAIR Left 09/01/2019    Procedure: ARTHROSCOPY SHOULDER ROTATOR CUFF REPAIR;  Surgeon: Tania Ade, MD;  Location: [place];  Service: Orthopedics;  Laterality: Left;   SHOULDER ARTHROSCOPY WITH SUBACROMIAL DECOMPRESSION Left 09/01/2019   Procedure: SHOULDER ARTHROSCOPY WITH SUBACROMIAL DECOMPRESSION;  Surgeon: Tania Ade, MD;  Location: [place];  Service: Orthopedics;  Laterality: Left;    History reviewed. No pertinent family history.  Social History   Socioeconomic History   Marital status: Legally Separated    Spouse name: Not on file   Number of children: Not on file   Years of education: Not on file   Highest education level: Not on file  Occupational History   Not on file  Tobacco Use   Smoking status: Former    Packs/day: 0.25    Years: 40.00    Pack years: 10.00    Types: Cigarettes, Cigars    Quit date: 01/26/2018    Years since quitting: 4.2   Smokeless tobacco: Never   Tobacco comments:    cigar 4x per week  Vaping Use   Vaping Use:  Never used  Substance and Sexual Activity   Alcohol use: Yes    Alcohol/week: 6.0 standard drinks    Types: 6 Cans of beer per week    Comment: few beers on the weekend   Drug use: Yes    Types: Marijuana    Comment: marijuana 4x per week   Sexual activity: Not Currently    Birth control/protection: None  Other Topics Concern   Not on file  Social History Narrative   Not on file   Social Determinants of Health   Financial Resource Strain: Not on file  Food Insecurity: Not on file  Transportation Needs: Not on file  Physical Activity: Not on file  Stress: Not on file  Social Connections: Not on file  Intimate Partner Violence: Not on file    ROS Review of Systems  Constitutional:  Negative for chills, fatigue and fever.  HENT:  Negative for sore throat.        Dysphagia  Eyes:  Negative for pain, discharge and itching.  Respiratory:  Negative for chest tightness and shortness of breath.    Cardiovascular:  Negative for chest pain and palpitations.  Gastrointestinal:  Negative for constipation, diarrhea, nausea and vomiting.  Endocrine: Negative for polydipsia, polyphagia and polyuria.  Genitourinary:  Negative for frequency and urgency.  Musculoskeletal:  Positive for arthralgias, back pain and neck pain.  Skin:  Negative for rash and wound.  Neurological:  Negative for dizziness and headaches.  Hematological:  Does not bruise/bleed easily.  Psychiatric/Behavioral:  Negative for confusion, self-injury and suicidal ideas.    Objective:   Today's Vitals: BP 138/76   Pulse 91   Ht '5\' 11"'  (1.803 m)   Wt 174 lb 12.8 oz (79.3 kg)   SpO2 98%   BMI 24.38 kg/m   Physical Exam HENT:     Head: Normocephalic.     Right Ear: External ear normal.     Left Ear: External ear normal.     Nose: No congestion.     Mouth/Throat:     Mouth: Mucous membranes are moist.     Dentition: Has dentures (upper and lower).  Eyes:     Extraocular Movements: Extraocular movements intact.     Pupils: Pupils are equal, round, and reactive to light.     Comments: arcus senilis bilaterally of the eyes  Cardiovascular:     Rate and Rhythm: Normal rate and regular rhythm.     Pulses: Normal pulses.     Heart sounds: Normal heart sounds.  Pulmonary:     Effort: Pulmonary effort is normal.     Breath sounds: Normal breath sounds.  Abdominal:     Palpations: Abdomen is soft.  Musculoskeletal:     Cervical back: No rigidity.     Right lower leg: No edema.     Left lower leg: No edema.  Neurological:     Mental Status: He is alert and oriented to person, place, and time.  Psychiatric:     Comments: Normal affect    Assessment & Plan:   Problem List Items Addressed This Visit       Cardiovascular and Mediastinum   Essential hypertension, benign   Relevant Orders   CBC with Differential/Platelet   CMP14+EGFR   TSH + free T4   Lipid panel     Digestive   Dysphagia   Relevant  Orders   Ambulatory referral to Gastroenterology     Other   Ganglion cyst of dorsum of right wrist    -  new symptoms around April 2023 with intermittent pain and swelling with pain with flexion -following up with orthopedics today        Chronic pain - Primary    -given the patient's multiples surgeries and chronic pain, I will place a referral to pain management and provide him with a 5-day course of narcotics -referral sent        Relevant Medications   oxyCODONE-acetaminophen (PERCOCET) 10-325 MG tablet   Other Relevant Orders   Ambulatory referral to Pain Clinic   Other Visit Diagnoses     Need for tetanus, diphtheria, and acellular pertussis (Tdap) vaccine       Relevant Orders   Tdap vaccine greater than or equal to 7yo IM (Completed)   Need for shingles vaccine       Relevant Orders   Varicella-zoster vaccine IM (Shingrix) (Completed)   Need for hepatitis C screening test       Relevant Orders   Hepatitis C Antibody   Vitamin D deficiency       Relevant Orders   Vitamin D (25 hydroxy)   IFG (impaired fasting glucose)       Relevant Orders   Hemoglobin A1C       Outpatient Encounter Medications as of 05/04/2022  Medication Sig   amLODipine (NORVASC) 5 MG tablet Take 1 tablet (5 mg total) by mouth daily.   betamethasone valerate ointment (VALISONE) 0.1 % Apply topically as needed.   cholecalciferol (VITAMIN D3) 25 MCG (1000 UT) tablet Take 1,000 Units by mouth daily.   diltiazem (CARDIZEM SR) 60 MG 12 hr capsule Take 60 mg by mouth 2 (two) times daily.   ILEVRO 0.3 % ophthalmic suspension SMARTSIG:In Eye(s)   moxifloxacin (VIGAMOX) 0.5 % ophthalmic solution Apply to eye.   oxyCODONE-acetaminophen (PERCOCET) 10-325 MG tablet Take 1 tablet by mouth every 8 (eight) hours as needed for up to 5 days for pain.   pantoprazole (PROTONIX) 40 MG tablet Take 1 tablet (40 mg total) by mouth daily before breakfast.   pravastatin (PRAVACHOL) 40 MG tablet Take 40 mg by mouth  daily.   prednisoLONE acetate (PRED FORTE) 1 % ophthalmic suspension 1 drop 3 (three) times daily.   nystatin (MYCOSTATIN) 100000 UNIT/ML suspension Take 5 mLs (500,000 Units total) by mouth 4 (four) times daily. (Patient not taking: Reported on 05/04/2022)   [DISCONTINUED] oxyCODONE-acetaminophen (PERCOCET) 7.5-325 MG tablet SMARTSIG:0.5-1 Tablet(s) By Mouth 1-3 Times Daily PRN (Patient not taking: Reported on 05/04/2022)   [DISCONTINUED] traMADol (ULTRAM) 50 MG tablet Take 50 mg by mouth every 6 (six) hours as needed. (Patient not taking: Reported on 05/04/2022)   No facility-administered encounter medications on file as of 05/04/2022.    Follow-up: No follow-ups on file.   Alvira Monday, FNP

## 2022-05-04 NOTE — Patient Instructions (Addendum)
I appreciate the opportunity to provide care to you today!    Follow up:  3 months  Labs: please stop by the lab anytime during the week to get your blood drawn (CBC, CMP, TSH, Lipid profile, HgA1c, Vit D)  Screening: Hep C  -Please pick up your prescription at the pharmacy   Thank you for getting your Tdap and Shingles vaccine  Referrals today-  GI and pain clinic   Please continue to a heart-healthy diet and increase your physical activities. Try to exercise for 36mns at least three times a week.      It was a pleasure to see you and I look forward to continuing to work together on your health and well-being. Please do not hesitate to call the office if you need care or have questions about your care.   Have a wonderful day and week. With Gratitude, GAlvira MondayMSN, FNP-BC

## 2022-05-05 DIAGNOSIS — G8929 Other chronic pain: Secondary | ICD-10-CM | POA: Insufficient documentation

## 2022-05-05 DIAGNOSIS — M67432 Ganglion, left wrist: Secondary | ICD-10-CM | POA: Insufficient documentation

## 2022-05-05 DIAGNOSIS — M67431 Ganglion, right wrist: Secondary | ICD-10-CM | POA: Insufficient documentation

## 2022-05-05 NOTE — Assessment & Plan Note (Signed)
-  new symptoms around April 2023 with intermittent pain and swelling with pain with flexion -following up with orthopedics today

## 2022-05-05 NOTE — Assessment & Plan Note (Signed)
-  chronic -dysphagia x >20 yrs -reports used to see Dr. Dorien Chihuahua -referral placed to GI

## 2022-05-05 NOTE — Assessment & Plan Note (Addendum)
-  given the patient's multiples surgeries and chronic pain, I will place a referral to pain management and provide him with a 5-day course of narcotics -referral sent

## 2022-05-08 LAB — CMP14+EGFR
ALT: 20 IU/L (ref 0–44)
AST: 27 IU/L (ref 0–40)
Albumin/Globulin Ratio: 1.7 (ref 1.2–2.2)
Albumin: 4.8 g/dL (ref 3.8–4.8)
Alkaline Phosphatase: 114 IU/L (ref 44–121)
BUN/Creatinine Ratio: 12 (ref 10–24)
BUN: 11 mg/dL (ref 8–27)
Bilirubin Total: 0.3 mg/dL (ref 0.0–1.2)
CO2: 18 mmol/L — ABNORMAL LOW (ref 20–29)
Calcium: 9.8 mg/dL (ref 8.6–10.2)
Chloride: 104 mmol/L (ref 96–106)
Creatinine, Ser: 0.95 mg/dL (ref 0.76–1.27)
Globulin, Total: 2.9 g/dL (ref 1.5–4.5)
Glucose: 99 mg/dL (ref 70–99)
Potassium: 4.7 mmol/L (ref 3.5–5.2)
Sodium: 142 mmol/L (ref 134–144)
Total Protein: 7.7 g/dL (ref 6.0–8.5)
eGFR: 88 mL/min/{1.73_m2} (ref >59)

## 2022-05-08 LAB — TSH+FREE T4
Free T4: 1.1 ng/dL (ref 0.82–1.77)
TSH: 1.59 u[IU]/mL (ref 0.450–4.500)

## 2022-05-08 LAB — CBC WITH DIFFERENTIAL/PLATELET
Basophils Absolute: 0.1 10*3/uL (ref 0.0–0.2)
Basos: 1 %
EOS (ABSOLUTE): 0.2 10*3/uL (ref 0.0–0.4)
Eos: 3 %
Hematocrit: 48.4 % (ref 37.5–51.0)
Hemoglobin: 16.4 g/dL (ref 13.0–17.7)
Immature Grans (Abs): 0 10*3/uL (ref 0.0–0.1)
Immature Granulocytes: 0 %
Lymphocytes Absolute: 2.5 10*3/uL (ref 0.7–3.1)
Lymphs: 30 %
MCH: 32 pg (ref 26.6–33.0)
MCHC: 33.9 g/dL (ref 31.5–35.7)
MCV: 95 fL (ref 79–97)
Monocytes Absolute: 0.9 10*3/uL (ref 0.1–0.9)
Monocytes: 10 %
Neutrophils Absolute: 4.8 10*3/uL (ref 1.4–7.0)
Neutrophils: 56 %
Platelets: 432 10*3/uL (ref 150–450)
RBC: 5.12 x10E6/uL (ref 4.14–5.80)
RDW: 13.4 % (ref 11.6–15.4)
WBC: 8.5 10*3/uL (ref 3.4–10.8)

## 2022-05-08 LAB — LIPID PANEL
Chol/HDL Ratio: 2.4 ratio (ref 0.0–5.0)
Cholesterol, Total: 206 mg/dL — ABNORMAL HIGH (ref 100–199)
HDL: 87 mg/dL (ref >39)
LDL Chol Calc (NIH): 107 mg/dL — ABNORMAL HIGH (ref 0–99)
Triglycerides: 64 mg/dL (ref 0–149)
VLDL Cholesterol Cal: 12 mg/dL (ref 5–40)

## 2022-05-08 LAB — HEMOGLOBIN A1C
Est. average glucose Bld gHb Est-mCnc: 114 mg/dL
Hgb A1c MFr Bld: 5.6 % (ref 4.8–5.6)

## 2022-05-08 LAB — VITAMIN D 25 HYDROXY (VIT D DEFICIENCY, FRACTURES): Vit D, 25-Hydroxy: 27.2 ng/mL — ABNORMAL LOW (ref 30.0–100.0)

## 2022-05-08 LAB — HEPATITIS C ANTIBODY: Hep C Virus Ab: NONREACTIVE

## 2022-05-09 ENCOUNTER — Encounter: Payer: Self-pay | Admitting: Orthopedic Surgery

## 2022-05-09 ENCOUNTER — Ambulatory Visit (INDEPENDENT_AMBULATORY_CARE_PROVIDER_SITE_OTHER): Payer: Medicare Other | Admitting: Orthopedic Surgery

## 2022-05-09 VITALS — Ht 71.0 in | Wt 174.0 lb

## 2022-05-09 DIAGNOSIS — M67432 Ganglion, left wrist: Secondary | ICD-10-CM | POA: Diagnosis not present

## 2022-05-09 NOTE — Progress Notes (Signed)
New Patient Visit  Assessment: Xavier Livingston is a 66 y.o. male with the following: Left wrist, dorsal ganglion; recurrence  Plan: Xavier Livingston has a recurrence of the dorsal ganglion of the left wrist.  Compared to the last time I saw him, the size has not changed.  However, he notes that there has been some acute worsening over the past couple weeks.  Once again, I reiterated my reluctance to aspirate it, based on its appearance, and current location.  He is in agreement with this plan.  Continue with Voltaren gel.  Medications as needed.  Brace as needed.  Follow-up as needed.  Follow-up: Return if symptoms worsen or fail to improve.  Subjective:  Chief Complaint  Patient presents with   Wrist Pain    Lt wrist cyst has come back and is painful 1.5-2 wks ago    History of Present Illness: Xavier Livingston is a 66 y.o. male who returns to clinic today for repeat evaluation of a left wrist.  I have seen him multiple times for a ganglion cyst in the left wrist.  I have aspirated this once before.  I saw him a couple months ago, with some worsening pain and slight swelling in the area.  He reports over the past couple of weeks, he has had increased swelling in this area.  It is also tender to palpation.  Nothing else is changed.  He continues to use the brace as needed.  He has been using diclofenac gel.   Review of Systems: No fevers or chills No numbness or tingling No chest pain No shortness of breath No bowel or bladder dysfunction No GI distress No headaches   Objective: Ht '5\' 11"'$  (1.803 m)   Wt 174 lb (78.9 kg)   BMI 24.27 kg/m   Physical Exam:  General: Alert and oriented.  No acute distress. Gait: Normal  Left wrist without any bruising or erythema.  Mild swelling over the dorsal and radial aspect of the wrist, extending to the volar wrist, close to his previous surgical incision.  This is tender to palpation.  No pain with [facility] grind testing of the left thumb.  Mass  is relatively small, and soft.  Does not feel amenable to aspiration with ultrasound guidance.   IMAGING: I personally reviewed images previously obtained in clinic   No new imaging today   New Medications:  No orders of the defined types were placed in this encounter.     Mordecai Rasmussen, MD  05/09/2022 3:26 PM

## 2022-05-15 NOTE — Progress Notes (Signed)
  Please inform the patient that his cholesterol is elevated. I recommend a diet rich in fruits and vegetables and low in carbs and fat. I will recheck his labs at the next appt.  He can take Vit D 5000IU OTC for his low Vit D levels.

## 2022-05-31 ENCOUNTER — Ambulatory Visit (INDEPENDENT_AMBULATORY_CARE_PROVIDER_SITE_OTHER): Payer: 59 | Admitting: Gastroenterology

## 2022-05-31 ENCOUNTER — Encounter (INDEPENDENT_AMBULATORY_CARE_PROVIDER_SITE_OTHER): Payer: Self-pay

## 2022-05-31 ENCOUNTER — Encounter (INDEPENDENT_AMBULATORY_CARE_PROVIDER_SITE_OTHER): Payer: Self-pay | Admitting: Gastroenterology

## 2022-05-31 ENCOUNTER — Other Ambulatory Visit (INDEPENDENT_AMBULATORY_CARE_PROVIDER_SITE_OTHER): Payer: Self-pay

## 2022-05-31 VITALS — BP 145/84 | HR 93 | Temp 98.4°F | Ht 71.0 in | Wt 168.3 lb

## 2022-05-31 DIAGNOSIS — K224 Dyskinesia of esophagus: Secondary | ICD-10-CM

## 2022-05-31 DIAGNOSIS — K219 Gastro-esophageal reflux disease without esophagitis: Secondary | ICD-10-CM

## 2022-05-31 DIAGNOSIS — R1310 Dysphagia, unspecified: Secondary | ICD-10-CM | POA: Diagnosis not present

## 2022-05-31 DIAGNOSIS — R1319 Other dysphagia: Secondary | ICD-10-CM

## 2022-05-31 MED ORDER — OMEPRAZOLE 40 MG PO CPDR: 40.0000 mg | DELAYED_RELEASE_CAPSULE | Freq: Every day | ORAL | 3 refills | Status: DC

## 2022-05-31 NOTE — Patient Instructions (Addendum)
Please stop protonix I have sent omeprazole '40mg'$  for you to take once daily, 30 minutes prior to breakfast You can try adding in boost or ensure protein shakes 2-3x/day to help maintain nutrients since you are not able to swallow well In regards to mass on the Left side of your neck, please follow up with PCP regarding this as this may require further evaluation, as discussed, this could be an enlarged lymph node given the pain you have with it.

## 2022-05-31 NOTE — Progress Notes (Addendum)
Referring Provider: Gilmore Laroche, FNP Primary Care Physician:  Gilmore Laroche, FNP Primary GI Physician: previously Rehman  Chief Complaint  Patient presents with   Dysphagia    Follow up on dysphagia. States since last EGD he has been having pain on left side of throat and swallowing is worse.    HPI:   Xavier Livingston is a 66 y.o. male with past medical history of GERD and dysphagia.  Patient presenting today for dysphagia.  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 Has had 7-8 EGDs with dilation with only minimal, short term improvement in dysphagia.   -Esophagram done 05/2016 Esophagus: At 1 minute, contrast column measures approximately 10 cm from the GE junction to the top of the contrast column. By the 2 minute mark, the column of contrast and cleared. Trace amount of residual contrast within the esophagus.  .  Gastroesophageal junction: No hiatal hernia.  .  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 The lack of contractile reserve on multiple rapid swallows and DCI< than 450 in the majority of the upright swallows supports a diagnosis of IEM. Would correlate with bolus clearance on barium esophagram. Of note the UES residual pressure is Elevated.  -EGD 09/2020 - Normal hypopharynx. - Discolored, granular mucosa in the esophagus. Biopsied. - Z-line regular, 42 cm from the incisors. - 2 cm hiatal hernia. - Normal stomach. - Normal duodenal bulb and second  portion of the duodenum. Comment: Esophagus was not dilated.  -EGD w /endoflip w dilation 08/21/21 2cm hiatal hernia  Abnormal esophageal endoflip  Duodenitis , recommended botox of LES if dysphagia continued  -Bravo study 08/21/21 pH Study; OFF MEDS Total Distal Time pH <4 is 6.3 %, NL < 4.9 Demeester Score is 20.1  , NL < 14.7 Pathologic Reflux is Present   Today, Patient reports that he has had dysphagia since procedures in September at [facility], he was supposed to have follow up but his appt with them was cancelled multiple times so was never able to see them again. He states that he was previously at [facility] for primary care and was referred to GI provider there, though they did not do anything since he was being seen at [facility]. He reports that throat is sore on the left side and feels somewhat swollen. He states that he has to push down on that area to help get foods/liquids down. He is not really eating now due to this. Has lost around 20 pounds in the past 3 months. He denies heartburn or acid reflux. He is maintained on protonix 40mg  daily. States that swelling to left side of his neck began a few weeks after procedures at [facility] last year. He also notes hoarseness for the past 3 weeks, he reports he is coughing up phlegm daily. Does not think symptoms are worse in the morning. Has issues with liquids passing down as well. Feels that substances get stuck anywhere From sternal notch to upper chest, he sometimes has to beat on his chest and bend over to help with liquids going down.  Seen previously by ENT with no issues found on their end to explain his symptoms.  Last Colonoscopy:2015 External hemorrhoids otherwise normal colonoscopy. Last Endoscopy: as above  Recommendations:  Repeat colonoscopy in 2025  Past Medical History:  Diagnosis Date   Anxiety    Arthritis    Complication of anesthesia    diff swallowing,reflx   Dyspnea    Dysrhythmia    irregular heartbeat in  the 80's   Enlarged prostate    GERD (gastroesophageal reflux disease)    Headache    Hyperlipemia    Hypertension    Nocturia more than twice per night    Reflux esophagitis    Seizures (HCC)    last at age 66 or 66 years old   Smoker within last 12 months    Swallowing difficulty     Past Surgical History:  Procedure Laterality Date   ANTERIOR CERVICAL DECOMPRESSION/DISCECTOMY FUSION 4 LEVELS N/A 04/11/2015   Procedure: Anterior Cervical Discectomy/ Decompression Fusion Cervical three-four, Cervical four-five, Cervical five-six, Cervical six- seven;  Surgeon: Donalee Citrin, MD;  Location: [facility];  Service: Neurosurgery;  Laterality: N/A;   BACK SURGERY     BACK SURGERY  08/2018   BIOPSY  09/29/2020   Procedure: BIOPSY;  Surgeon: Malissa Hippo, MD;  Location: [facility];  Service: Endoscopy;;   CATARACT EXTRACTION W/PHACO Right 03/02/2022   Procedure: CATARACT EXTRACTION PHACO AND INTRAOCULAR LENS PLACEMENT (IOC);  Surgeon: Fabio Pierce, MD;  Location: [facility];  Service: Ophthalmology;  Laterality: Right;  CDE: 9.02   CATARACT EXTRACTION W/PHACO Left 03/23/2022   Procedure: CATARACT EXTRACTION PHACO AND INTRAOCULAR LENS PLACEMENT (IOC);  Surgeon: Fabio Pierce, MD;  Location: [facility];  Service: Ophthalmology;  Laterality: Left;  CDE: 7.68   COLONOSCOPY N/A 08/18/2014   Procedure: COLONOSCOPY;  Surgeon: Malissa Hippo, MD;  [facility];  Service: Endoscopy;  Laterality: N/A;  1030-moved to 1200 Ann to notify pt   ESOPHAGEAL DILATION N/A 11/29/2016   Procedure: ESOPHAGEAL DILATION;  Surgeon: Malissa Hippo, MD;  [facility];  Service: Endoscopy;  Laterality: N/A;   ESOPHAGEAL DILATION N/A 05/16/2017   Procedure: ESOPHAGEAL DILATION;  Surgeon: Malissa Hippo, MD;  [facility];  Service: Endoscopy;  Laterality: N/A;   ESOPHAGEAL DILATION N/A 07/29/2019   Procedure: ESOPHAGEAL DILATION;  Surgeon: Malissa Hippo, MD;  [facility];  Service: Endoscopy;  Laterality: N/A;   ESOPHAGOGASTRODUODENOSCOPY N/A 08/18/2014   Procedure: ESOPHAGOGASTRODUODENOSCOPY (EGD);  Surgeon: Malissa Hippo, MD;  [facility];  Service: Endoscopy;  Laterality: N/A;   ESOPHAGOGASTRODUODENOSCOPY N/A 11/29/2016   Procedure: ESOPHAGOGASTRODUODENOSCOPY (EGD);  Surgeon: Malissa Hippo, MD;  [facility];  Service: Endoscopy;  Laterality: N/A;  12:00   ESOPHAGOGASTRODUODENOSCOPY N/A 07/29/2019   Procedure: ESOPHAGOGASTRODUODENOSCOPY (EGD);  Surgeon: Malissa Hippo, MD;  [facility];  Service: Endoscopy;  Laterality: N/A;  2:00   ESOPHAGOGASTRODUODENOSCOPY N/A 09/29/2020   Procedure: ESOPHAGOGASTRODUODENOSCOPY (EGD);  Surgeon: Malissa Hippo, MD;  [facility];  Service: Endoscopy;  Laterality: N/A;  130   ESOPHAGOGASTRODUODENOSCOPY (EGD) WITH PROPOFOL N/A 05/16/2017   Procedure: ESOPHAGOGASTRODUODENOSCOPY (EGD) WITH PROPOFOL;  Surgeon: Malissa Hippo, MD;  [facility];  Service: Endoscopy;  Laterality: N/A;  8:30   excision of mass of back     MALONEY DILATION  08/18/2014   Procedure: MALONEY DILATION;  Surgeon: Malissa Hippo, MD;  [facility];  Service: Endoscopy;;   MASS EXCISION N/A 10/21/2013   Procedure: EXCISION MASSES X 2 ON BACK;  Surgeon: Marlane Hatcher, MD;  Location: [facility];  Service: General;  Laterality: N/A;   NASAL SEPTUM SURGERY     NECK SURGERY  2019   NECK SURGERY  2020   POSTERIOR CERVICAL FUSION/FORAMINOTOMY N/A 02/05/2018   Procedure: Posterior Cervical Fusion with lateral mass fixation - Cervical six-seven;  Surgeon: Donalee Citrin, MD;  Location: [facility] OR;  Service: Neurosurgery;  Laterality: N/A;   POSTERIOR CERVICAL FUSION/FORAMINOTOMY N/A 02/06/2019   Procedure: Posterior Cervical Fusion with lateral mass fixation redo C6-T1;  Surgeon: Donalee Citrin, MD;  Location: [facility] OR;  Service: Neurosurgery;  Laterality: N/A;   SHOULDER ARTHROSCOPY WITH  ROTATOR CUFF REPAIR Left 09/01/2019   Procedure: ARTHROSCOPY SHOULDER ROTATOR CUFF REPAIR;  Surgeon: Jones Broom, MD;  Location: [facility];  Service: Orthopedics;  Laterality: Left;   SHOULDER ARTHROSCOPY WITH SUBACROMIAL DECOMPRESSION Left 09/01/2019   Procedure: SHOULDER ARTHROSCOPY WITH SUBACROMIAL DECOMPRESSION;  Surgeon: Jones Broom, MD;  Location: [facility];  Service: Orthopedics;  Laterality: Left;    Current Outpatient Medications  Medication Sig Dispense Refill   amLODipine (NORVASC) 5 MG tablet Take 1 tablet (5 mg total) by mouth daily. 90 tablet 3   betamethasone valerate ointment (VALISONE) 0.1 % Apply topically as needed.     cholecalciferol (VITAMIN D3) 25 MCG (1000 UT) tablet Take 1,000 Units by mouth daily.     diltiazem (CARDIZEM SR) 60 MG 12 hr capsule Take 60 mg by mouth 2 (two) times daily.     ILEVRO 0.3 % ophthalmic suspension SMARTSIG:In Eye(s)     moxifloxacin (VIGAMOX) 0.5 % ophthalmic solution Apply to eye.     Oxycodone HCl 10 MG TABS Take by mouth. One daily     pantoprazole (PROTONIX) 40 MG tablet Take 1 tablet (40 mg total) by mouth daily before breakfast. 90 tablet 3   pravastatin (PRAVACHOL) 40 MG tablet Take 40 mg by mouth daily.     prednisoLONE acetate (PRED FORTE) 1 % ophthalmic suspension 1 drop 3 (three) times daily.     No current facility-administered medications for this visit.    Allergies as of 05/31/2022   (No Known Allergies)    No family history on file.  Social History   Socioeconomic History   Marital status: Legally Separated    Spouse name: Not on file   Number of children: Not on file   Years of education: Not on file   Highest education level: Not on file  Occupational History   Not on file  Tobacco Use   Smoking status: Every Day    Types: Cigars    Passive exposure: Current   Smokeless tobacco: Never   Tobacco comments:    cigar 4x per week  Vaping Use   Vaping Use: Never used   Substance and Sexual Activity   Alcohol use: Yes    Alcohol/week: 6.0 standard drinks of alcohol    Types: 6 Cans of beer per week    Comment: few beers on the weekend   Drug use: Yes    Types: Marijuana    Comment: marijuana 4x per week   Sexual activity: Not Currently    Birth control/protection: None  Other Topics Concern   Not on file  Social History Narrative   Not on file   Social Determinants of Health   Financial Resource Strain: Not on file  Food Insecurity: Not on file  Transportation Needs: Not on file  Physical Activity: Not on file  Stress: Not on file  Social Connections: Not on file   Review of systems General: negative for malaise, night sweats, fever, chills, +weight loss Neck: pain to left lateral neck with swelling Resp: Negative for cough, wheezing, dyspnea at rest CV: Negative for chest pain, leg swelling, palpitations, orthopnea GI: denies melena, hematochezia, nausea, vomiting, diarrhea, constipation, odynophagia, early satiety. +unintentional weight loss. +dysphagia  MSK: Negative for joint pain or swelling, back pain, and muscle pain. Derm: Negative for itching or rash Psych: Denies depression, anxiety, memory loss, confusion. No homicidal or suicidal ideation.  Heme: Negative for prolonged bleeding, bruising easily, and swollen nodes. Endocrine: Negative for cold or heat intolerance, polyuria, polydipsia and goiter. Neuro: negative for tremor, gait imbalance, syncope and seizures. The remainder of the review of systems is noncontributory.  Physical Exam: BP (!) 145/84 (BP Location: Right Arm, Patient Position: Sitting, Cuff Size: Large)   Pulse 93   Temp 98.4 F (36.9 C) (Oral)   Ht 5\' 11"  (1.803 m)   Wt 168 lb 4.8 oz (76.3 kg)   BMI 23.47 kg/m  General:   Alert and oriented. No distress noted. Pleasant and cooperative.  Head:  Normocephalic and atraumatic. Eyes:  Conjuctiva clear without scleral icterus. Mouth:  Oral mucosa pink and  moist. Good dentition. No lesions. Neck: tenderness to L lateral neck with small, moveable lesion that resembles an enlarged lymph node. Heart: Normal rate and rhythm, s1 and s2 heart sounds present.  Lungs: Clear lung sounds in all lobes. Respirations equal and unlabored. Abdomen:  +BS, soft, non-tender and non-distended. No rebound or guarding. No HSM or masses noted. Derm: No palmar erythema or jaundice Msk:  Symmetrical without gross deformities. Normal posture. Extremities:  Without edema. Neurologic:  Alert and  oriented x4 Psych:  Alert and cooperative. Normal mood and affect.  Invalid input(s): "6 MONTHS"   ASSESSMENT: Xavier Livingston is a 66 y.o. male presenting today for continued dysphagia.   Long history of dysphagia with extensive work up in the past to include multiple EGDs with dilation that only resolves dysphagia temporarily. Previously seen at [facility], last in September 2022, with recommendations for possible botox injections to LES if dysphagia continued, however, patient states he was unable to have follow up with them despite multiple appointments that were cancelled by provider. He continues to lose weight as he cannot really tolerate foods due to severe dysphagia. Recommend proceeding with EGD with possible botox injection of LES. In the mean time I advised him to try adding in boost/ensure protein shakes 2-3x/day to help with nutrition. He has been on protonix for some time and reports sore throat, will try changing PPI, for considerations of silent reflux as cause of this.  He does note some swelling and pain to left side of his neck as well, possibly a locally reactive lymph node, I encouraged him to see PCP for further evaluation of this.    PLAN:  EGD with possible botox of LES 2. Stop protonix 3. Start omeprazole 40mg  daily 4. See PCP regarding neck swelling/tenderness   All questions were answered, patient verbalized understanding and is in agreement with plan as  outlined above.   Follow Up: TBD   Jaystin Mcgarvey L. Jeanmarie Hubert, MSN, APRN, AGNP-C Adult-Gerontology Nurse Practitioner [facility]  I received notification from the scheduler from our department that the insurance did not cover the Botox injection for his dysphagia as it was  not clear why he required to have this injection.  As this therapy was suggested at [facility], he will read refer back to their center for further evaluation and consideration for Botox injection.  We will cancel his endoscopy.  Katrinka Blazing, MD Gastroenterology and Hepatology [facility]

## 2022-06-01 ENCOUNTER — Encounter (INDEPENDENT_AMBULATORY_CARE_PROVIDER_SITE_OTHER): Payer: Self-pay

## 2022-06-10 ENCOUNTER — Other Ambulatory Visit (INDEPENDENT_AMBULATORY_CARE_PROVIDER_SITE_OTHER): Payer: Self-pay | Admitting: Internal Medicine

## 2022-06-11 ENCOUNTER — Other Ambulatory Visit (INDEPENDENT_AMBULATORY_CARE_PROVIDER_SITE_OTHER): Payer: Self-pay | Admitting: Internal Medicine

## 2022-06-12 ENCOUNTER — Ambulatory Visit (INDEPENDENT_AMBULATORY_CARE_PROVIDER_SITE_OTHER): Payer: 59 | Admitting: Family Medicine

## 2022-06-12 ENCOUNTER — Telehealth (INDEPENDENT_AMBULATORY_CARE_PROVIDER_SITE_OTHER): Payer: Self-pay | Admitting: Gastroenterology

## 2022-06-12 ENCOUNTER — Encounter: Payer: Self-pay | Admitting: Family Medicine

## 2022-06-12 VITALS — BP 136/81 | HR 106 | Ht 71.0 in | Wt 169.6 lb

## 2022-06-12 DIAGNOSIS — M542 Cervicalgia: Secondary | ICD-10-CM

## 2022-06-12 DIAGNOSIS — M25551 Pain in right hip: Secondary | ICD-10-CM

## 2022-06-12 DIAGNOSIS — G8929 Other chronic pain: Secondary | ICD-10-CM | POA: Diagnosis not present

## 2022-06-12 MED ORDER — OXYCODONE HCL 10 MG PO TABS: 10.0000 mg | ORAL_TABLET | Freq: Four times a day (QID) | ORAL | 0 refills | Status: AC

## 2022-06-12 NOTE — Assessment & Plan Note (Signed)
reports waking up with sharp, stinging pain in the right hip the onset of symptoms was 2 weeks ago pain is relieved when lying supine or sitting and intensified with ambulation Pain likely due to arthritis Conservative measurements recommended Will continue to monitor Oxy 10 mg ordered     erythema, edema, or warmth

## 2022-06-12 NOTE — Patient Instructions (Addendum)
I appreciate the opportunity to provide care to you today!    Please stop by Xavier Livingston to get an x-ray and CT scan of the neck  Please pick up your prescription at the pharmacy  Referrals today- Pain clinic   Please continue to a heart-healthy diet and increase your physical activities. Try to exercise for 58mns at least three times a week.      It was a pleasure to see you and I look forward to continuing to work together on your health and well-being. Please do not hesitate to call the office if you need care or have questions about your care.   Have a wonderful day and week. With Gratitude, GAlvira MondayMSN, FNP-BC

## 2022-06-12 NOTE — Progress Notes (Signed)
Established Patient Office Visit  Subjective:  Patient ID: Xavier Livingston, male    DOB: 18-Nov-1956  Age: 66 y.o. MRN: [id number]  CC:  Chief Complaint  Patient presents with   Follow-up    Pt c/o neck pain on his left side states its his lymph node hurts to swallow has used oxycodone to help with this before, also has right hip pain today.     HPI Xavier Livingston is a 66 y.o. male with past medical history of chronic  pain presents with c/o of neck and hip pain. Neck pain: c/o of left-sided neck pain. Rates pain 9/10. Reports following up with his neurosurgeon, Franco Nones, who referred him for right occipital nerve injection. He reports minimum relief with the injection. He had a CT of the neck and thyroid without contrast on 05/19/21 with no pathologically enlarged or necrotic appearing lymph nodes and no masses noted.   Hip pain: reports no injury or trauma. Reports waking up with sharp, stinging pain in the right hip. The onset of symptoms was 2 weeks ago. Pain is relieved when lying supine or sitting and intensified with ambulation.    Past Medical History:  Diagnosis Date   Anxiety    Arthritis    Complication of anesthesia    diff swallowing,reflx   Dyspnea    Dysrhythmia    irregular heartbeat in the 80's   Enlarged prostate    GERD (gastroesophageal reflux disease)    Headache    Hyperlipemia    Hypertension    Nocturia more than twice per night    Reflux esophagitis    Seizures (HCC)    last at age 89 or 66 years old   Smoker within last 12 months    Swallowing difficulty     Past Surgical History:  Procedure Laterality Date   ANTERIOR CERVICAL DECOMPRESSION/DISCECTOMY FUSION 4 LEVELS N/A 04/11/2015   Procedure: Anterior Cervical Discectomy/ Decompression Fusion Cervical three-four, Cervical four-five, Cervical five-six, Cervical six- seven;  Surgeon: Kary Kos, MD;  Location: [facility];  Service: Neurosurgery;  Laterality: N/A;   BACK SURGERY     BACK  SURGERY  08/2018   BIOPSY  09/29/2020   Procedure: BIOPSY;  Surgeon: Rogene Houston, MD;  Location: [facility];  Service: Endoscopy;;   CATARACT EXTRACTION W/PHACO Right 03/02/2022   Procedure: CATARACT EXTRACTION PHACO AND INTRAOCULAR LENS PLACEMENT ([place]);  Surgeon: Baruch Goldmann, MD;  Location: [facility];  Service: Ophthalmology;  Laterality: Right;  CDE: 9.02   CATARACT EXTRACTION W/PHACO Left 03/23/2022   Procedure: CATARACT EXTRACTION PHACO AND INTRAOCULAR LENS PLACEMENT ([facility]);  Surgeon: Baruch Goldmann, MD;  Location: [facility];  Service: Ophthalmology;  Laterality: Left;  CDE: 7.68   COLONOSCOPY N/A 08/18/2014   Procedure: COLONOSCOPY;  Surgeon: Rogene Houston, MD;  [facility]: [facility];  Service: Endoscopy;  Laterality: N/A;  1030-moved to 1200 Ann to notify pt   ESOPHAGEAL DILATION N/A 11/29/2016   Procedure: ESOPHAGEAL DILATION;  Surgeon: Rogene Houston, MD;  [facility];  Service: Endoscopy;  Laterality: N/A;   ESOPHAGEAL DILATION N/A 05/16/2017   Procedure: ESOPHAGEAL DILATION;  Surgeon: Rogene Houston, MD;  [facility];  Service: Endoscopy;  Laterality: N/A;   ESOPHAGEAL DILATION N/A 07/29/2019   Procedure: ESOPHAGEAL DILATION;  Surgeon: Rogene Houston, MD;  [facility];  Service: Endoscopy;  Laterality: N/A;   ESOPHAGOGASTRODUODENOSCOPY N/A 08/18/2014   Procedure: ESOPHAGOGASTRODUODENOSCOPY (EGD);  Surgeon: Rogene Houston,  MD;  [facility];  Service: Endoscopy;  Laterality: N/A;   ESOPHAGOGASTRODUODENOSCOPY N/A 11/29/2016   Procedure: ESOPHAGOGASTRODUODENOSCOPY (EGD);  Surgeon: Rogene Houston, MD;  [facility];  Service: Endoscopy;  Laterality: N/A;  12:00   ESOPHAGOGASTRODUODENOSCOPY N/A 07/29/2019   Procedure: ESOPHAGOGASTRODUODENOSCOPY (EGD);  Surgeon: Rogene Houston, MD;  [facility];  Service: Endoscopy;  Laterality: N/A;  2:00   ESOPHAGOGASTRODUODENOSCOPY N/A 09/29/2020   Procedure:  ESOPHAGOGASTRODUODENOSCOPY (EGD);  Surgeon: Rogene Houston, MD;  [facility];  Service: Endoscopy;  Laterality: N/A;  130   ESOPHAGOGASTRODUODENOSCOPY (EGD) WITH PROPOFOL N/A 05/16/2017   Procedure: ESOPHAGOGASTRODUODENOSCOPY (EGD) WITH PROPOFOL;  Surgeon: Rogene Houston, MD;  [facility];  Service: Endoscopy;  Laterality: N/A;  8:30   excision of mass of back     MALONEY DILATION  08/18/2014   Procedure: MALONEY DILATION;  Surgeon: Rogene Houston, MD;  [facility];  Service: Endoscopy;;   MASS EXCISION N/A 10/21/2013   Procedure: EXCISION MASSES X 2 ON BACK;  Surgeon: Scherry Ran, MD;  Location: [facility];  Service: General;  Laterality: N/A;   NASAL SEPTUM SURGERY     NECK SURGERY  2019   NECK SURGERY  2020   POSTERIOR CERVICAL FUSION/FORAMINOTOMY N/A 02/05/2018   Procedure: Posterior Cervical Fusion with lateral mass fixation - Cervical six-seven;  Surgeon: Kary Kos, MD;  Location: [place];  Service: Neurosurgery;  Laterality: N/A;   POSTERIOR CERVICAL FUSION/FORAMINOTOMY N/A 02/06/2019   Procedure: Posterior Cervical Fusion with lateral mass fixation redo C6-T1;  Surgeon: Kary Kos, MD;  Location: [place];  Service: Neurosurgery;  Laterality: N/A;   SHOULDER ARTHROSCOPY WITH ROTATOR CUFF REPAIR Left 09/01/2019   Procedure: ARTHROSCOPY SHOULDER ROTATOR CUFF REPAIR;  Surgeon: Tania Ade, MD;  Location: [place];  Service: Orthopedics;  Laterality: Left;   SHOULDER ARTHROSCOPY WITH SUBACROMIAL DECOMPRESSION Left 09/01/2019   Procedure: SHOULDER ARTHROSCOPY WITH SUBACROMIAL DECOMPRESSION;  Surgeon: Tania Ade, MD;  Location: [place];  Service: Orthopedics;  Laterality: Left;    History reviewed. No pertinent family history.  Social History   Socioeconomic History   Marital status: Legally Separated    Spouse name: Not on file   Number of children: Not on file   Years of education: Not on file    Highest education level: Not on file  Occupational History   Not on file  Tobacco Use   Smoking status: Every Day    Types: Cigars    Passive exposure: Current   Smokeless tobacco: Never   Tobacco comments:    cigar 4x per week  Vaping Use   Vaping Use: Never used  Substance and Sexual Activity   Alcohol use: Yes    Alcohol/week: 6.0 standard drinks of alcohol    Types: 6 Cans of beer per week    Comment: few beers on the weekend   Drug use: Yes    Types: Marijuana    Comment: marijuana 4x per week   Sexual activity: Not Currently    Birth control/protection: None  Other Topics Concern   Not on file  Social History Narrative   Not on file   Social Determinants of Health   Financial Resource Strain: Not on file  Food Insecurity: Not on file  Transportation Needs: Not on file  Physical Activity: Not on file  Stress: Not on file  Social Connections: Not on file  Intimate Partner Violence: Not on file  Outpatient Medications Prior to Visit  Medication Sig Dispense Refill   amLODipine (NORVASC) 5 MG tablet Take 1 tablet (5 mg total) by mouth daily. 90 tablet 3   betamethasone valerate ointment (VALISONE) 0.1 % Apply topically as needed.     cholecalciferol (VITAMIN D3) 25 MCG (1000 UT) tablet Take 1,000 Units by mouth daily.     diltiazem (CARDIZEM SR) 60 MG 12 hr capsule Take 60 mg by mouth 2 (two) times daily.     ILEVRO 0.3 % ophthalmic suspension SMARTSIG:In Eye(s)     moxifloxacin (VIGAMOX) 0.5 % ophthalmic solution Apply to eye.     omeprazole (PRILOSEC) 40 MG capsule Take 1 capsule (40 mg total) by mouth daily. 30 capsule 3   pravastatin (PRAVACHOL) 40 MG tablet Take 40 mg by mouth daily.     prednisoLONE acetate (PRED FORTE) 1 % ophthalmic suspension 1 drop 3 (three) times daily.     Oxycodone HCl 10 MG TABS Take by mouth. One daily     No facility-administered medications prior to visit.    No Known Allergies  ROS Review of Systems  Constitutional:   Negative for fatigue and fever.  Musculoskeletal:  Positive for neck pain.       Right hip pain  Psychiatric/Behavioral:  Negative for self-injury and suicidal ideas.       Objective:    Physical Exam HENT:     Head: Normocephalic.  Cardiovascular:     Rate and Rhythm: Normal rate and regular rhythm.     Pulses: Normal pulses.     Heart sounds: Normal heart sounds.  Pulmonary:     Effort: Pulmonary effort is normal.     Breath sounds: Normal breath sounds.  Musculoskeletal:     Right hip: Tenderness present. No deformity or crepitus. Normal range of motion. Normal strength.     Comments: Tenderness noted at the sternocleidomastoid muscle of the neck on the left side. No tenderness noted on the right. ROM is intact. No lymphadenopathy noted.  Tenderness along the lateral aspect of the right greater trochanter with no erythema, edema, or warmth noted.   Neurological:     Mental Status: He is alert.     BP 136/81   Pulse (!) 106   Ht _0  (1.803 m)   Wt 169 lb 9.6 oz (76.9 kg)   SpO2 97%   BMI 23.65 kg/m  Wt Readings from Last 3 Encounters:  06/12/22 169 lb 9.6 oz (76.9 kg)  05/31/22 168 lb 4.8 oz (76.3 kg)  05/09/22 174 lb (78.9 kg)    Lab Results  Component Value Date   TSH 1.590 05/07/2022   Lab Results  Component Value Date   WBC 8.5 05/07/2022   HGB 16.4 05/07/2022   HCT 48.4 05/07/2022   MCV 95 05/07/2022   PLT 432 05/07/2022   Lab Results  Component Value Date   NA 142 05/07/2022   K 4.7 05/07/2022   CO2 18 (L) 05/07/2022   GLUCOSE 99 05/07/2022   BUN 11 05/07/2022   CREATININE 0.95 05/07/2022   BILITOT 0.3 05/07/2022   ALKPHOS 114 05/07/2022   AST 27 05/07/2022   ALT 20 05/07/2022   PROT 7.7 05/07/2022   ALBUMIN 4.8 05/07/2022   CALCIUM 9.8 05/07/2022   ANIONGAP 8 01/26/2019   EGFR 88 05/07/2022   Lab Results  Component Value Date   CHOL 206 (H) 05/07/2022   Lab Results  Component Value Date   HDL 87 05/07/2022   Lab  Results   Component Value Date   LDLCALC 107 (H) 05/07/2022   Lab Results  Component Value Date   TRIG 64 05/07/2022   Lab Results  Component Value Date   CHOLHDL 2.4 05/07/2022   Lab Results  Component Value Date   HGBA1C 5.6 05/07/2022      Assessment & Plan:   Problem List Items Addressed This Visit       Other   Chronic pain - Primary   Relevant Medications   Oxycodone HCl 10 MG TABS   Other Relevant Orders   Ambulatory referral to Pain Clinic   Neck pain on left side    -c/o of left-sided neck pain -rates pain 9/10 -reports following up with his neurosurgeon, Franco Nones, who referred him for a right occipital nerve injection -he reports minimum relief with the injection -he had a CT of the neck and thyroid without contrast on 05/19/21 with no pathologically enlarged or necrotic appearing lymph nodes and no masses noted -will get an x-ray and repeat the  ct scan of the neck and thyroid -referral made to the pain clinic -will supply a 4 days supply of narcotics -encouraged to f/u with Dr. Saintclair Halsted      Relevant Orders   CT SOFT TISSUE NECK WO CONTRAST   DG Neck Soft Tissue   Right hip pain    reports waking up with sharp, stinging pain in the right hip the onset of symptoms was 2 weeks ago pain is relieved when lying supine or sitting and intensified with ambulation Pain likely due to arthritis Conservative measurements recommended Will continue to monitor Oxy 10 mg ordered     erythema, edema, or warmth       Meds ordered this encounter  Medications   Oxycodone HCl 10 MG TABS    Sig: Take 1 tablet (10 mg total) by mouth every 6 (six) hours for 4 days. One daily    Dispense:  15 tablet    Refill:  0    Follow-up: No follow-ups on file.    Alvira Monday, FNP

## 2022-06-12 NOTE — Assessment & Plan Note (Addendum)
-  c/o of left-sided neck pain -rates pain 9/10 -reports following up with his neurosurgeon, Franco Nones, who referred him for a right occipital nerve injection -he reports minimum relief with the injection -he had a CT of the neck and thyroid without contrast on 05/19/21 with no pathologically enlarged or necrotic appearing lymph nodes and no masses noted -will get an x-ray and repeat the  ct scan of the neck and thyroid -referral made to the pain clinic -will supply a 4 days supply of narcotics -encouraged to f/u with Dr. Saintclair Halsted

## 2022-06-14 ENCOUNTER — Ambulatory Visit: Payer: 59 | Admitting: Family Medicine

## 2022-06-20 ENCOUNTER — Other Ambulatory Visit (HOSPITAL_COMMUNITY): Payer: 59

## 2022-06-20 ENCOUNTER — Ambulatory Visit (HOSPITAL_COMMUNITY): Payer: 59 | Attending: Family Medicine

## 2022-06-25 ENCOUNTER — Encounter (HOSPITAL_COMMUNITY): Payer: Self-pay

## 2022-06-25 ENCOUNTER — Ambulatory Visit (HOSPITAL_COMMUNITY): Admit: 2022-06-25 | Payer: 59 | Admitting: Gastroenterology

## 2022-06-25 SURGERY — ESOPHAGOGASTRODUODENOSCOPY (EGD) WITH PROPOFOL
Anesthesia: Monitor Anesthesia Care

## 2022-07-02 ENCOUNTER — Telehealth (INDEPENDENT_AMBULATORY_CARE_PROVIDER_SITE_OTHER): Payer: Self-pay | Admitting: *Deleted

## 2022-07-02 NOTE — Telephone Encounter (Signed)
Checked with [facility] on status of referral, they have called all patient numbers and they have been discconnected

## 2022-08-03 ENCOUNTER — Ambulatory Visit (INDEPENDENT_AMBULATORY_CARE_PROVIDER_SITE_OTHER): Payer: 59 | Admitting: Family Medicine

## 2022-08-03 ENCOUNTER — Encounter: Payer: Self-pay | Admitting: Family Medicine

## 2022-08-03 VITALS — BP 133/75 | HR 111 | Ht 71.0 in | Wt 171.1 lb

## 2022-08-03 DIAGNOSIS — M67432 Ganglion, left wrist: Secondary | ICD-10-CM | POA: Diagnosis not present

## 2022-08-03 DIAGNOSIS — M25551 Pain in right hip: Secondary | ICD-10-CM

## 2022-08-03 DIAGNOSIS — Z23 Encounter for immunization: Secondary | ICD-10-CM | POA: Diagnosis not present

## 2022-08-03 DIAGNOSIS — J029 Acute pharyngitis, unspecified: Secondary | ICD-10-CM

## 2022-08-03 DIAGNOSIS — G8929 Other chronic pain: Secondary | ICD-10-CM | POA: Diagnosis not present

## 2022-08-03 DIAGNOSIS — R1310 Dysphagia, unspecified: Secondary | ICD-10-CM

## 2022-08-03 MED ORDER — OXYCODONE-ACETAMINOPHEN 10-325 MG PO TABS: 1.0000 | ORAL_TABLET | Freq: Three times a day (TID) | ORAL | 0 refills | Status: AC | PRN

## 2022-08-03 NOTE — Patient Instructions (Addendum)
I appreciate the opportunity to provide care to you today!    Follow up:  3 months  Labs: next visit   Please pick up your medication at the pharmacy and f/u with ENT    Please continue to a heart-healthy diet and increase your physical activities. Try to exercise for 68mns at least three times a week.      It was a pleasure to see you and I look forward to continuing to work together on your health and well-being. Please do not hesitate to call the office if you need care or have questions about your care.   Have a wonderful day and week. With Gratitude, GAlvira MondayMSN, FNP-BC

## 2022-08-03 NOTE — Progress Notes (Unsigned)
Established Patient Office Visit  Subjective:  Patient ID: Xavier Livingston, male    DOB: February 08, 1956  Age: 66 y.o. MRN: [id number]  CC:  Chief Complaint  Patient presents with   Follow-up    3 month follow up appt, pt c/o pain on his left wrist, will be seeing ortho today. C/o sore throat since procedure of his esophagus last year.     HPI Xavier Livingston is a 66 y.o. male with past medical history of Ganglion cyst of the left wrist, right hip pain,an presents for f/u of  chronic medical conditions. Ganglion cyst of the left wrist:  following up with orthopedic surgery today, 08/03/22. He reports worsening pain and swelling in the area over the past couple of weeks. He rates pain 9/10 and reports minimum relief of symptoms with Voltaren gel and hand brace.  Right hip pain: improved with increased physical activities.  Neck pain on left side: He note some swelling and pain to left side of his neck .He was a no-show for his CT scan of the neck, and he never got an x-ray of his neck.He reports changing his cell phone number.  Sore throat: he c/o of sore throat on the left side and denies URI symptoms. He states that he has to push down on that area to help get foods/liquids down. He notes using Chloraseptic spray with minimum relief and reports that he will be following up with ENT in [place]. ENT had previously seen him with no issues found on their end to explain his symptoms. Dysphagia:  followed up with GI on 05/31/22 with plans for an EGD with possible botox of LES. His insurance denied the procedure, and he was referred to [facility] for a repeat EGD with botox of the LES. However, the patient could not be reached, and the procedure was canceled.   Past Medical History:  Diagnosis Date   Anxiety    Arthritis    Complication of anesthesia    diff swallowing,reflx   Dyspnea    Dysrhythmia    irregular heartbeat in the 80's   Enlarged prostate    GERD (gastroesophageal reflux disease)     Headache    Hyperlipemia    Hypertension    Nocturia more than twice per night    Reflux esophagitis    Seizures (HCC)    last at age 22 or 66 years old   Smoker within last 12 months    Swallowing difficulty     Past Surgical History:  Procedure Laterality Date   ANTERIOR CERVICAL DECOMPRESSION/DISCECTOMY FUSION 4 LEVELS N/A 04/11/2015   Procedure: Anterior Cervical Discectomy/ Decompression Fusion Cervical three-four, Cervical four-five, Cervical five-six, Cervical six- seven;  Surgeon: Kary Kos, MD;  [facility]: [facility];  Service: Neurosurgery;  Laterality: N/A;   BACK SURGERY     BACK SURGERY  08/2018   BIOPSY  09/29/2020   Procedure: BIOPSY;  Surgeon: Rogene Houston, MD;  Location: [facility];  Service: Endoscopy;;   CATARACT EXTRACTION W/PHACO Right 03/02/2022   Procedure: CATARACT EXTRACTION PHACO AND INTRAOCULAR LENS PLACEMENT ([place]);  Surgeon: Baruch Goldmann, MD;  Location: [facility];  Service: Ophthalmology;  Laterality: Right;  CDE: 9.02   CATARACT EXTRACTION W/PHACO Left 03/23/2022   Procedure: CATARACT EXTRACTION PHACO AND INTRAOCULAR LENS PLACEMENT (IOC);  Surgeon: Baruch Goldmann, MD;  Location: [facility];  Service: Ophthalmology;  Laterality: Left;  CDE: 7.68   COLONOSCOPY N/A 08/18/2014   Procedure: COLONOSCOPY;  Surgeon: Rogene Houston,  MD;  [facility];  Service: Endoscopy;  Laterality: N/A;  1030-moved to 1200 Ann to notify pt   ESOPHAGEAL DILATION N/A 11/29/2016   Procedure: ESOPHAGEAL DILATION;  Surgeon: Rogene Houston, MD;  [facility];  Service: Endoscopy;  Laterality: N/A;   ESOPHAGEAL DILATION N/A 05/16/2017   Procedure: ESOPHAGEAL DILATION;  Surgeon: Rogene Houston, MD;  [facility];  Service: Endoscopy;  Laterality: N/A;   ESOPHAGEAL DILATION N/A 07/29/2019   Procedure: ESOPHAGEAL DILATION;  Surgeon: Rogene Houston, MD;  [facility];  Service: Endoscopy;  Laterality: N/A;   ESOPHAGOGASTRODUODENOSCOPY  N/A 08/18/2014   Procedure: ESOPHAGOGASTRODUODENOSCOPY (EGD);  Surgeon: Rogene Houston, MD;  [facility];  Service: Endoscopy;  Laterality: N/A;   ESOPHAGOGASTRODUODENOSCOPY N/A 11/29/2016   Procedure: ESOPHAGOGASTRODUODENOSCOPY (EGD);  Surgeon: Rogene Houston, MD;  [facility];  Service: Endoscopy;  Laterality: N/A;  12:00   ESOPHAGOGASTRODUODENOSCOPY N/A 07/29/2019   Procedure: ESOPHAGOGASTRODUODENOSCOPY (EGD);  Surgeon: Rogene Houston, MD;  [facility];  Service: Endoscopy;  Laterality: N/A;  2:00   ESOPHAGOGASTRODUODENOSCOPY N/A 09/29/2020   Procedure: ESOPHAGOGASTRODUODENOSCOPY (EGD);  Surgeon: Rogene Houston, MD;  [facility];  Service: Endoscopy;  Laterality: N/A;  130   ESOPHAGOGASTRODUODENOSCOPY (EGD) WITH PROPOFOL N/A 05/16/2017   Procedure: ESOPHAGOGASTRODUODENOSCOPY (EGD) WITH PROPOFOL;  Surgeon: Rogene Houston, MD;  [facility];  Service: Endoscopy;  Laterality: N/A;  8:30   excision of mass of back     MALONEY DILATION  08/18/2014   Procedure: MALONEY DILATION;  Surgeon: Rogene Houston, MD;  [facility];  Service: Endoscopy;;   MASS EXCISION N/A 10/21/2013   Procedure: EXCISION MASSES X 2 ON BACK;  Surgeon: Scherry Ran, MD;  Location: [facility];  Service: General;  Laterality: N/A;   NASAL SEPTUM SURGERY     NECK SURGERY  2019   NECK SURGERY  2020   POSTERIOR CERVICAL FUSION/FORAMINOTOMY N/A 02/05/2018   Procedure: Posterior Cervical Fusion with lateral mass fixation - Cervical six-seven;  Surgeon: Kary Kos, MD;  Location: [place];  Service: Neurosurgery;  Laterality: N/A;   POSTERIOR CERVICAL FUSION/FORAMINOTOMY N/A 02/06/2019   Procedure: Posterior Cervical Fusion with lateral mass fixation redo C6-T1;  Surgeon: Kary Kos, MD;  Location: [place];  Service: Neurosurgery;  Laterality: N/A;   SHOULDER ARTHROSCOPY WITH ROTATOR CUFF REPAIR Left 09/01/2019   Procedure: ARTHROSCOPY SHOULDER ROTATOR  CUFF REPAIR;  Surgeon: Tania Ade, MD;  Location: [place];  Service: Orthopedics;  Laterality: Left;   SHOULDER ARTHROSCOPY WITH SUBACROMIAL DECOMPRESSION Left 09/01/2019   Procedure: SHOULDER ARTHROSCOPY WITH SUBACROMIAL DECOMPRESSION;  Surgeon: Tania Ade, MD;  Location: [place];  Service: Orthopedics;  Laterality: Left;    History reviewed. No pertinent family history.  Social History   Socioeconomic History   Marital status: Legally Separated    Spouse name: Not on file   Number of children: Not on file   Years of education: Not on file   Highest education level: Not on file  Occupational History   Not on file  Tobacco Use   Smoking status: Some Days    Types: Cigars    Passive exposure: Current   Smokeless tobacco: Never   Tobacco comments:    cigar 4x per week  Vaping Use   Vaping Use: Never used  Substance and Sexual Activity   Alcohol use: Yes    Alcohol/week: 6.0 standard drinks  of alcohol    Types: 6 Cans of beer per week    Comment: few beers on the weekend   Drug use: Yes    Types: Marijuana    Comment: marijuana 4x per week   Sexual activity: Not Currently    Birth control/protection: None  Other Topics Concern   Not on file  Social History Narrative   Not on file   Social Determinants of Health   Financial Resource Strain: Not on file  Food Insecurity: Not on file  Transportation Needs: Not on file  Physical Activity: Not on file  Stress: Not on file  Social Connections: Not on file  Intimate Partner Violence: Not on file    Outpatient Medications Prior to Visit  Medication Sig Dispense Refill   amLODipine (NORVASC) 5 MG tablet Take 1 tablet (5 mg total) by mouth daily. 90 tablet 3   betamethasone valerate ointment (VALISONE) 0.1 % Apply topically as needed.     cholecalciferol (VITAMIN D3) 25 MCG (1000 UT) tablet Take 1,000 Units by mouth daily.     diltiazem (CARDIZEM SR) 60 MG 12 hr capsule Take  60 mg by mouth 2 (two) times daily.     ILEVRO 0.3 % ophthalmic suspension SMARTSIG:In Eye(s)     moxifloxacin (VIGAMOX) 0.5 % ophthalmic solution Apply to eye.     omeprazole (PRILOSEC) 40 MG capsule Take 1 capsule (40 mg total) by mouth daily. 30 capsule 3   pravastatin (PRAVACHOL) 40 MG tablet Take 40 mg by mouth daily.     prednisoLONE acetate (PRED FORTE) 1 % ophthalmic suspension 1 drop 3 (three) times daily.     No facility-administered medications prior to visit.    No Known Allergies  ROS Review of Systems  HENT:  Positive for sore throat.   Respiratory:  Negative for chest tightness and shortness of breath.   Gastrointestinal:  Negative for nausea and vomiting.  Genitourinary:  Negative for hematuria and urgency.  Musculoskeletal:  Positive for neck pain.       Left wrist pain  Skin:  Negative for rash and wound.      Objective:    Physical Exam Neck:     Comments: Tenderness with palpation of the neck on the left side. No lymphadenopathy noted Cardiovascular:     Rate and Rhythm: Normal rate and regular rhythm.     Pulses: Normal pulses.     Heart sounds: Normal heart sounds.  Pulmonary:     Effort: Pulmonary effort is normal.     Breath sounds: Normal breath sounds.  Musculoskeletal:     Left wrist: Swelling and tenderness present.     Cervical back: Tenderness (left side) present.     Right lower leg: No edema.     Left lower leg: No edema.  Skin:    General: Skin is warm.  Neurological:     Mental Status: He is alert.     BP 133/75   Pulse (!) 111   Ht 5' 11" (1.803 m)   Wt 171 lb 1.3 oz (77.6 kg)   SpO2 96%   BMI 23.86 kg/m  Wt Readings from Last 3 Encounters:  08/03/22 171 lb 1.3 oz (77.6 kg)  06/12/22 169 lb 9.6 oz (76.9 kg)  05/31/22 168 lb 4.8 oz (76.3 kg)    Lab Results  Component Value Date   TSH 1.590 05/07/2022   Lab Results  Component Value Date   WBC 8.5 05/07/2022   HGB 16.4 05/07/2022  HCT 48.4 05/07/2022   MCV 95  05/07/2022   PLT 432 05/07/2022   Lab Results  Component Value Date   NA 142 05/07/2022   K 4.7 05/07/2022   CO2 18 (L) 05/07/2022   GLUCOSE 99 05/07/2022   BUN 11 05/07/2022   CREATININE 0.95 05/07/2022   BILITOT 0.3 05/07/2022   ALKPHOS 114 05/07/2022   AST 27 05/07/2022   ALT 20 05/07/2022   PROT 7.7 05/07/2022   ALBUMIN 4.8 05/07/2022   CALCIUM 9.8 05/07/2022   ANIONGAP 8 01/26/2019   EGFR 88 05/07/2022   Lab Results  Component Value Date   CHOL 206 (H) 05/07/2022   Lab Results  Component Value Date   HDL 87 05/07/2022   Lab Results  Component Value Date   LDLCALC 107 (H) 05/07/2022   Lab Results  Component Value Date   TRIG 64 05/07/2022   Lab Results  Component Value Date   CHOLHDL 2.4 05/07/2022   Lab Results  Component Value Date   HGBA1C 5.6 05/07/2022      Assessment & Plan:   Problem List Items Addressed This Visit       Digestive   Dysphagia, idiopathic    followed up with GI on 05/31/22 with plans for an EGD with possible botox of LES His insurance denied the procedure, and he was referred to [facility] for a repeat EGD with botox of the LES However, the patient could not be reached, and the procedure was canceled Encouraged pt to contact GI and update his contact information and possibly reschedule his EGD with botox        Other   Ganglion cyst of wrist, left - Primary    following up with orthopedic surgery today, 08/03/22 He reports worsening pain and swelling in the area over the past couple of weeks  He rates pain 9/10 and reports minimum relief of symptoms with Voltaren gel and hand brace Encouraged to follow up with orthopedics       Chronic pain    Reports that the pain clinic hasn't contacted him since the referral was placed Will provide a short supply of analgesic      Relevant Medications   oxyCODONE-acetaminophen (PERCOCET) 10-325 MG tablet   Right hip pain    improved with increased physical activities       Sore  throat    He c/o of sore throat on the left side and denies URI symptoms He states that he has to push down on that area to help get foods/liquids down He notes using Chloraseptic spray with minimum relief and reports that he will be following up with ENT in [facility] ENT had previously seen him with no issues found on their end to explain his symptoms Encouraged to follow up with ENT and continue symptomatic management with warm water gargles and throat lozenges      Other Visit Diagnoses     Immunization due       Relevant Orders   Pneumococcal polysaccharide vaccine 23-valent greater than or equal to 2yo subcutaneous/IM (Completed)       Meds ordered this encounter  Medications   oxyCODONE-acetaminophen (PERCOCET) 10-325 MG tablet    Sig: Take 1 tablet by mouth every 8 (eight) hours as needed for up to 5 days for pain.    Dispense:  15 tablet    Refill:  0    Follow-up: No follow-ups on file.    Alvira Monday, FNP

## 2022-08-06 DIAGNOSIS — J029 Acute pharyngitis, unspecified: Secondary | ICD-10-CM | POA: Insufficient documentation

## 2022-08-06 NOTE — Assessment & Plan Note (Signed)
followed up with GI on 05/31/22 with plans for an EGD with possible botox of LES His insurance denied the procedure, and he was referred to [facility] for a repeat EGD with botox of the LES However, the patient could not be reached, and the procedure was canceled Encouraged pt to contact GI and update his contact information and possibly reschedule his EGD with botox

## 2022-08-06 NOTE — Assessment & Plan Note (Signed)
following up with orthopedic surgery today, 08/03/22 He reports worsening pain and swelling in the area over the past couple of weeks  He rates pain 9/10 and reports minimum relief of symptoms with Voltaren gel and hand brace Encouraged to follow up with orthopedics

## 2022-08-06 NOTE — Assessment & Plan Note (Signed)
He c/o of sore throat on the left side and denies URI symptoms He states that he has to push down on that area to help get foods/liquids down He notes using Chloraseptic spray with minimum relief and reports that he will be following up with ENT in [facility] ENT had previously seen him with no issues found on their end to explain his symptoms Encouraged to follow up with ENT and continue symptomatic management with warm water gargles and throat lozenges

## 2022-08-06 NOTE — Assessment & Plan Note (Signed)
Reports that the pain clinic hasn't contacted him since the referral was placed Will provide a short supply of analgesic

## 2022-08-06 NOTE — Assessment & Plan Note (Signed)
improved with increased physical activities

## 2022-08-13 ENCOUNTER — Ambulatory Visit: Payer: 59

## 2022-08-13 NOTE — Progress Notes (Unsigned)
ERROR

## 2022-08-21 ENCOUNTER — Ambulatory Visit (INDEPENDENT_AMBULATORY_CARE_PROVIDER_SITE_OTHER): Payer: 59 | Admitting: Orthopedic Surgery

## 2022-08-21 ENCOUNTER — Ambulatory Visit (INDEPENDENT_AMBULATORY_CARE_PROVIDER_SITE_OTHER): Payer: 59

## 2022-08-21 ENCOUNTER — Encounter: Payer: Self-pay | Admitting: Orthopedic Surgery

## 2022-08-21 VITALS — BP 136/88 | HR 93 | Ht 71.0 in | Wt 176.0 lb

## 2022-08-21 DIAGNOSIS — M67432 Ganglion, left wrist: Secondary | ICD-10-CM | POA: Diagnosis not present

## 2022-08-21 DIAGNOSIS — M5481 Occipital neuralgia: Secondary | ICD-10-CM | POA: Insufficient documentation

## 2022-08-21 DIAGNOSIS — Z23 Encounter for immunization: Secondary | ICD-10-CM

## 2022-08-21 NOTE — Patient Instructions (Signed)
Please call for your MRI appointment.  [facility] [phone]

## 2022-08-21 NOTE — Progress Notes (Signed)
New Patient Visit  Assessment: Xavier Livingston is a 66 y.o. male with the following: Left wrist, dorsal ganglion; recurrence  Plan: Xavier Livingston has a recurrence of the ganglion of the left wrist.  He has swelling dorsally, as well as over the volar wrist.  Tenderness over the volar wrist.  Prior surgical excision approximately 20 years ago.  He would like to have it removed if possible.  Due to this being a recurrence, I have recommended an MRI.  Once we have the MRI, we can discuss the plan moving forward.  He will also need to obtain medical clearance.  Follow-up after the MRI.   Follow-up: Return for After MRI.  Subjective:  Chief Complaint  Patient presents with   Wrist Pain    Has cyst left wrist/ would like to discuss surgery     History of Present Illness: Xavier Livingston is a 66 y.o. male who returns to clinic today for repeat evaluation of a left wrist.  He has a history of a volar left wrist cyst excision, approximately 20 years ago.  I have seen him several times for this wrist.  I have aspirated the cyst on more than one occasion.  The size and appearance remained stable.  He does note tenderness volarly, as well as over the dorsal aspect of the wrist, on the radial side.  At this point, he is interested in repeat surgical excision.  He is planning to see his primary care provider soon.  In addition, he has a consultation scheduled with ENT due to some issues with swallowing.  Review of Systems: No fevers or chills No numbness or tingling No chest pain No shortness of breath No bowel or bladder dysfunction No GI distress No headaches   Objective: BP 136/88   Pulse 93   Ht '5\' 11"'$  (1.803 m)   Wt 176 lb (79.8 kg)   BMI 24.55 kg/m   Physical Exam:  General: Alert and oriented.  No acute distress. Gait: Normal  Left wrist without any bruising or erythema.  Mild swelling over the dorsal and radial aspect of the wrist, extending to the volar wrist, close to his  previous surgical incision.  This is tender to palpation.  No pain with [facility] grind testing of the left thumb.  Mass is relatively small, and soft.  Does not feel amenable to aspiration with ultrasound guidance.   IMAGING: I personally reviewed images previously obtained in clinic   No new imaging today   New Medications:  No orders of the defined types were placed in this encounter.     Mordecai Rasmussen, MD  08/21/2022 3:35 PM

## 2022-08-31 ENCOUNTER — Other Ambulatory Visit (INDEPENDENT_AMBULATORY_CARE_PROVIDER_SITE_OTHER): Payer: Self-pay

## 2022-08-31 ENCOUNTER — Encounter (INDEPENDENT_AMBULATORY_CARE_PROVIDER_SITE_OTHER): Payer: Self-pay

## 2022-08-31 DIAGNOSIS — R1310 Dysphagia, unspecified: Secondary | ICD-10-CM

## 2022-08-31 DIAGNOSIS — K224 Dyskinesia of esophagus: Secondary | ICD-10-CM

## 2022-09-04 ENCOUNTER — Encounter (HOSPITAL_COMMUNITY): Payer: Self-pay

## 2022-09-04 ENCOUNTER — Encounter (HOSPITAL_COMMUNITY)
Admission: RE | Admit: 2022-09-04 | Discharge: 2022-09-04 | Disposition: A | Discharge status: Home / Self Care | Payer: 59 | Source: Ambulatory Visit | Attending: Gastroenterology | Admitting: Gastroenterology

## 2022-09-04 DIAGNOSIS — I10 Essential (primary) hypertension: Secondary | ICD-10-CM

## 2022-09-05 ENCOUNTER — Encounter (HOSPITAL_COMMUNITY): Payer: Self-pay | Admitting: Anesthesiology

## 2022-09-06 ENCOUNTER — Encounter (HOSPITAL_COMMUNITY): Payer: Self-pay | Admitting: Gastroenterology

## 2022-09-06 ENCOUNTER — Ambulatory Visit (INDEPENDENT_AMBULATORY_CARE_PROVIDER_SITE_OTHER): Payer: 59 | Admitting: Gastroenterology

## 2022-09-06 ENCOUNTER — Encounter (HOSPITAL_COMMUNITY)
Admission: RE | Disposition: A | Discharge status: Home / Self Care | Payer: Self-pay | Source: Home / Self Care | Attending: Gastroenterology

## 2022-09-06 ENCOUNTER — Ambulatory Visit (HOSPITAL_COMMUNITY)
Admission: RE | Admit: 2022-09-06 | Discharge: 2022-09-06 | Disposition: A | Discharge status: Home / Self Care | Payer: 59 | Attending: Gastroenterology | Admitting: Gastroenterology

## 2022-09-06 DIAGNOSIS — Z539 Procedure and treatment not carried out, unspecified reason: Secondary | ICD-10-CM | POA: Diagnosis present

## 2022-09-06 DIAGNOSIS — K224 Dyskinesia of esophagus: Secondary | ICD-10-CM

## 2022-09-06 DIAGNOSIS — I10 Essential (primary) hypertension: Secondary | ICD-10-CM

## 2022-09-06 DIAGNOSIS — R1310 Dysphagia, unspecified: Secondary | ICD-10-CM

## 2022-09-06 SURGERY — ESOPHAGOGASTRODUODENOSCOPY (EGD) WITH PROPOFOL
Anesthesia: Monitor Anesthesia Care

## 2022-09-06 MED ORDER — ONABOTULINUMTOXINA 100 UNITS IJ SOLR
100.0000 [IU] | Freq: Once | INTRAMUSCULAR | Status: DC
  Filled 2022-09-06: qty 100

## 2022-09-06 MED ORDER — LACTATED RINGERS IV SOLN: INTRAVENOUS | Status: DC

## 2022-09-06 NOTE — Progress Notes (Signed)
Procedure canceled due to failure to get insurance authorization.  See note by Dr. Jenetta Downer.

## 2022-09-06 NOTE — Progress Notes (Signed)
Procedure was canceled as the insurance did not approve Botox injection.  Patient has not presented any improvement with empiric dilations of his esophagus and multiple attempts in the past.  I spoke with the patient about the importance of having an evaluation by [facility] -this group has previously recommended performing EGD with Botox injection.  Unfortunately, they could not get a hold of the patient as he was out of town.  I encouraged the patient to reach them but we will resend the referral to their group.

## 2022-10-24 ENCOUNTER — Telehealth (INDEPENDENT_AMBULATORY_CARE_PROVIDER_SITE_OTHER): Payer: Self-pay | Admitting: *Deleted

## 2022-10-24 NOTE — Telephone Encounter (Signed)
Spoke to scheduler in regards to status of referral - they have reached out to patient several times and have been unable to contact him - referral has been canceled

## 2022-10-24 NOTE — Telephone Encounter (Signed)
Thanks

## 2022-10-29 ENCOUNTER — Other Ambulatory Visit (INDEPENDENT_AMBULATORY_CARE_PROVIDER_SITE_OTHER): Payer: Self-pay | Admitting: Gastroenterology

## 2022-11-02 ENCOUNTER — Encounter: Payer: Self-pay | Admitting: Family Medicine

## 2022-11-02 ENCOUNTER — Ambulatory Visit (INDEPENDENT_AMBULATORY_CARE_PROVIDER_SITE_OTHER): Payer: 59 | Admitting: Family Medicine

## 2022-11-02 VITALS — BP 127/84 | HR 86 | Resp 16 | Ht 71.0 in | Wt 178.1 lb

## 2022-11-02 DIAGNOSIS — E038 Other specified hypothyroidism: Secondary | ICD-10-CM

## 2022-11-02 DIAGNOSIS — R1310 Dysphagia, unspecified: Secondary | ICD-10-CM | POA: Diagnosis not present

## 2022-11-02 DIAGNOSIS — G8929 Other chronic pain: Secondary | ICD-10-CM

## 2022-11-02 DIAGNOSIS — E7849 Other hyperlipidemia: Secondary | ICD-10-CM

## 2022-11-02 DIAGNOSIS — I10 Essential (primary) hypertension: Secondary | ICD-10-CM | POA: Diagnosis not present

## 2022-11-02 DIAGNOSIS — K219 Gastro-esophageal reflux disease without esophagitis: Secondary | ICD-10-CM

## 2022-11-02 DIAGNOSIS — E7800 Pure hypercholesterolemia, unspecified: Secondary | ICD-10-CM | POA: Diagnosis not present

## 2022-11-02 DIAGNOSIS — E559 Vitamin D deficiency, unspecified: Secondary | ICD-10-CM

## 2022-11-02 DIAGNOSIS — R7301 Impaired fasting glucose: Secondary | ICD-10-CM

## 2022-11-02 MED ORDER — AMLODIPINE BESYLATE 5 MG PO TABS: 5.0000 mg | ORAL_TABLET | Freq: Every day | ORAL | 3 refills | Status: AC

## 2022-11-02 MED ORDER — AMLODIPINE BESYLATE 5 MG PO TABS: 5.0000 mg | ORAL_TABLET | Freq: Every day | ORAL | 3 refills | Status: DC

## 2022-11-02 NOTE — Assessment & Plan Note (Addendum)
A referral was placed to GI, and the patient was contacted several times and could not be reached. He reports changing his number and moving out of [place] We will provide the patient with the contact number for the referral placed to GI for follow up.

## 2022-11-02 NOTE — Patient Instructions (Signed)
I appreciate the opportunity to provide care to you today!    Follow up:  3 months  Labs: please stop by the lab today/ or during the week to get your blood drawn (CBC, CMP, TSH, Lipid profile, HgA1c, Vit D)  Refill of your amlodipine 5 mg has been sent to your pharmacy  We will provide you with the number to pain management and GI to follow-up and schedule an appointment   Please continue to a heart-healthy diet and increase your physical activities. Try to exercise for 42mns at least three times a week.      It was a pleasure to see you and I look forward to continuing to work together on your health and well-being. Please do not hesitate to call the office if you need care or have questions about your care.   Have a wonderful day and week. With Gratitude, GAlvira MondayMSN, FNP-BC

## 2022-11-02 NOTE — Assessment & Plan Note (Signed)
Encouraged to continue taking omeprazole 40 mg once daily

## 2022-11-02 NOTE — Assessment & Plan Note (Signed)
Encouraged to continue taking pravastatin 40 mg daily Lab Results  Component Value Date   CHOL 206 (H) 05/07/2022   HDL 87 05/07/2022   LDLCALC 107 (H) 05/07/2022   TRIG 64 05/07/2022   CHOLHDL 2.4 05/07/2022

## 2022-11-02 NOTE — Assessment & Plan Note (Signed)
Controlled Encouraged to continue taking amlodipine 5 mg daily and diltiazem 60 mg twice daily BP Readings from Last 3 Encounters:  11/02/22 127/84  09/06/22 (!) 142/93  08/21/22 136/88

## 2022-11-02 NOTE — Assessment & Plan Note (Signed)
A referral was placed to pain management, but the patient  has not follow-up We will provide the patient with a contact number of the referral place for follow-up care Encouraged to take Tylenol as needed for pain

## 2022-11-02 NOTE — Progress Notes (Signed)
Established Patient Office Visit  Subjective:  Patient ID: Xavier Livingston, male    DOB: January 21, 1956  Age: 66 y.o. MRN: [id number]  CC:  Chief Complaint  Patient presents with   Dysphagia    Still having problems with his esophagus and swallowing.    Medication Refill    States he needs his oxycodone for pain     HPI Xavier Livingston is a 66 y.o. male with past medical history of essential hypertension, GERD, and hyperlipidemia presents for f/u of  chronic medical conditions.  Essential hypertension: He takes amlodipine 5 mg daily and diltiazem 60 mg twice daily.  He reports compliance with treatment regimen.  GERD: He takes omeprazole 40 mg once daily.  He reports compliance with treatment regimen and denies symptoms of heartburn today  Hyperlipidemia: He takes pravastatin 40 mg daily.  He reports compliance with treatment regimen.  Past Medical History:  Diagnosis Date   Anxiety    Arthritis    Dyspnea    Dysrhythmia    irregular heartbeat in the 80's   Enlarged prostate    GERD (gastroesophageal reflux disease)    Headache    Hyperlipemia    Hypertension    Nocturia more than twice per night    Reflux esophagitis    Seizures (HCC)    last at age 27 or 66 years old   Smoker within last 12 months    Swallowing difficulty     Past Surgical History:  Procedure Laterality Date   ANTERIOR CERVICAL DECOMPRESSION/DISCECTOMY FUSION 4 LEVELS N/A 04/11/2015   Procedure: Anterior Cervical Discectomy/ Decompression Fusion Cervical three-four, Cervical four-five, Cervical five-six, Cervical six- seven;  Surgeon: Kary Kos, MD;  Location: [facility];  Service: Neurosurgery;  Laterality: N/A;   BACK SURGERY     BACK SURGERY  08/2018   BIOPSY  09/29/2020   Procedure: BIOPSY;  Surgeon: Rogene Houston, MD;  Location: [facility];  Service: Endoscopy;;   CATARACT EXTRACTION W/PHACO Right 03/02/2022   Procedure: CATARACT EXTRACTION PHACO AND INTRAOCULAR LENS PLACEMENT ([place]);   Surgeon: Baruch Goldmann, MD;  Location: [facility];  Service: Ophthalmology;  Laterality: Right;  CDE: 9.02   CATARACT EXTRACTION W/PHACO Left 03/23/2022   Procedure: CATARACT EXTRACTION PHACO AND INTRAOCULAR LENS PLACEMENT ([facility]);  Surgeon: Baruch Goldmann, MD;  Location: [facility];  Service: Ophthalmology;  Laterality: Left;  CDE: 7.68   COLONOSCOPY N/A 08/18/2014   Procedure: COLONOSCOPY;  Surgeon: Rogene Houston, MD;  [facility];  Service: Endoscopy;  Laterality: N/A;  1030-moved to 1200 Ann to notify pt   ESOPHAGEAL DILATION N/A 11/29/2016   Procedure: ESOPHAGEAL DILATION;  Surgeon: Rogene Houston, MD;  [facility];  Service: Endoscopy;  Laterality: N/A;   ESOPHAGEAL DILATION N/A 05/16/2017   Procedure: ESOPHAGEAL DILATION;  Surgeon: Rogene Houston, MD;  [facility];  Service: Endoscopy;  Laterality: N/A;   ESOPHAGEAL DILATION N/A 07/29/2019   Procedure: ESOPHAGEAL DILATION;  Surgeon: Rogene Houston, MD;  [facility];  Service: Endoscopy;  Laterality: N/A;   ESOPHAGOGASTRODUODENOSCOPY N/A 08/18/2014   Procedure: ESOPHAGOGASTRODUODENOSCOPY (EGD);  Surgeon: Rogene Houston, MD;  [facility];  Service: Endoscopy;  Laterality: N/A;   ESOPHAGOGASTRODUODENOSCOPY N/A 11/29/2016   Procedure: ESOPHAGOGASTRODUODENOSCOPY (EGD);  Surgeon: Rogene Houston, MD;  [facility];  Service: Endoscopy;  Laterality: N/A;  12:00   ESOPHAGOGASTRODUODENOSCOPY N/A 07/29/2019   Procedure: ESOPHAGOGASTRODUODENOSCOPY (EGD);  Surgeon: Rogene Houston, MD;  [facility];  Service: Endoscopy;  Laterality: N/A;  2:00   ESOPHAGOGASTRODUODENOSCOPY N/A 09/29/2020   Procedure: ESOPHAGOGASTRODUODENOSCOPY (EGD);  Surgeon: Rogene Houston, MD;  [facility];  Service: Endoscopy;  Laterality: N/A;  130   ESOPHAGOGASTRODUODENOSCOPY (EGD) WITH PROPOFOL N/A 05/16/2017   Procedure: ESOPHAGOGASTRODUODENOSCOPY (EGD) WITH PROPOFOL;  Surgeon:  Rogene Houston, MD;  [facility];  Service: Endoscopy;  Laterality: N/A;  8:30   excision of mass of back     MALONEY DILATION  08/18/2014   Procedure: MALONEY DILATION;  Surgeon: Rogene Houston, MD;  [facility];  Service: Endoscopy;;   MASS EXCISION N/A 10/21/2013   Procedure: EXCISION MASSES X 2 ON BACK;  Surgeon: Scherry Ran, MD;  Location: [facility];  Service: General;  Laterality: N/A;   NASAL SEPTUM SURGERY     NECK SURGERY  2019   NECK SURGERY  2020   POSTERIOR CERVICAL FUSION/FORAMINOTOMY N/A 02/05/2018   Procedure: Posterior Cervical Fusion with lateral mass fixation - Cervical six-seven;  Surgeon: Kary Kos, MD;  Location: [place];  Service: Neurosurgery;  Laterality: N/A;   POSTERIOR CERVICAL FUSION/FORAMINOTOMY N/A 02/06/2019   Procedure: Posterior Cervical Fusion with lateral mass fixation redo C6-T1;  Surgeon: Kary Kos, MD;  Location: [place];  Service: Neurosurgery;  Laterality: N/A;   SHOULDER ARTHROSCOPY WITH ROTATOR CUFF REPAIR Left 09/01/2019   Procedure: ARTHROSCOPY SHOULDER ROTATOR CUFF REPAIR;  Surgeon: Tania Ade, MD;  Location: [place];  Service: Orthopedics;  Laterality: Left;   SHOULDER ARTHROSCOPY WITH SUBACROMIAL DECOMPRESSION Left 09/01/2019   Procedure: SHOULDER ARTHROSCOPY WITH SUBACROMIAL DECOMPRESSION;  Surgeon: Tania Ade, MD;  Location: [place];  Service: Orthopedics;  Laterality: Left;    No family history on file.  Social History   Socioeconomic History   Marital status: Legally Separated    Spouse name: Not on file   Number of children: Not on file   Years of education: Not on file   Highest education level: Not on file  Occupational History   Not on file  Tobacco Use   Smoking status: Some Days    Types: Cigars    Passive exposure: Current   Smokeless tobacco: Never   Tobacco comments:    cigar 4x per week  Vaping Use   Vaping Use: Never used  Substance and  Sexual Activity   Alcohol use: Yes    Alcohol/week: 6.0 standard drinks of alcohol    Types: 6 Cans of beer per week    Comment: few beers on the weekend   Drug use: Yes    Types: Marijuana    Comment: marijuana 4x per week   Sexual activity: Not Currently    Birth control/protection: None  Other Topics Concern   Not on file  Social History Narrative   Not on file   Social Determinants of Health   Financial Resource Strain: Not on file  Food Insecurity: Not on file  Transportation Needs: Not on file  Physical Activity: Not on file  Stress: Not on file  Social Connections: Not on file  Intimate Partner Violence: Not on file    Outpatient Medications Prior to Visit  Medication Sig Dispense Refill   betamethasone valerate ointment (VALISONE) 0.1 % Apply topically as needed.     cholecalciferol (VITAMIN D3) 25 MCG (1000 UT) tablet Take 1,000 Units by mouth daily.     diltiazem (CARDIZEM SR) 60 MG 12 hr capsule Take 60 mg by  mouth 2 (two) times daily.     ILEVRO 0.3 % ophthalmic suspension SMARTSIG:In Eye(s)     moxifloxacin (VIGAMOX) 0.5 % ophthalmic solution Apply to eye.     omeprazole (PRILOSEC) 40 MG capsule TAKE (1) CAPSULE BY MOUTH ONCE DAILY. 90 capsule 2   pravastatin (PRAVACHOL) 40 MG tablet Take 40 mg by mouth daily.     prednisoLONE acetate (PRED FORTE) 1 % ophthalmic suspension 1 drop 3 (three) times daily.     amLODipine (NORVASC) 5 MG tablet Take 1 tablet (5 mg total) by mouth daily. 90 tablet 3   No facility-administered medications prior to visit.    No Known Allergies  ROS Review of Systems  Constitutional:  Negative for fatigue.  Eyes:  Negative for visual disturbance.  Respiratory:  Negative for chest tightness and shortness of breath.   Cardiovascular:  Negative for chest pain and palpitations.  Neurological:  Negative for dizziness and headaches.      Objective:    Physical Exam HENT:     Head: Normocephalic.     Right Ear: External ear  normal.     Left Ear: External ear normal.  Cardiovascular:     Rate and Rhythm: Normal rate and regular rhythm.     Pulses: Normal pulses.     Heart sounds: Normal heart sounds.  Pulmonary:     Effort: Pulmonary effort is normal.  Neurological:     Mental Status: He is alert.     BP 127/84   Pulse 86   Resp 16   Ht _0  (1.803 m)   Wt 178 lb 1.9 oz (80.8 kg)   SpO2 94%   BMI 24.84 kg/m  Wt Readings from Last 3 Encounters:  11/02/22 178 lb 1.9 oz (80.8 kg)  09/06/22 178 lb (80.7 kg)  08/21/22 176 lb (79.8 kg)    Lab Results  Component Value Date   TSH 1.590 05/07/2022   Lab Results  Component Value Date   WBC 8.5 05/07/2022   HGB 16.4 05/07/2022   HCT 48.4 05/07/2022   MCV 95 05/07/2022   PLT 432 05/07/2022   Lab Results  Component Value Date   NA 142 05/07/2022   K 4.7 05/07/2022   CO2 18 (L) 05/07/2022   GLUCOSE 99 05/07/2022   BUN 11 05/07/2022   CREATININE 0.95 05/07/2022   BILITOT 0.3 05/07/2022   ALKPHOS 114 05/07/2022   AST 27 05/07/2022   ALT 20 05/07/2022   PROT 7.7 05/07/2022   ALBUMIN 4.8 05/07/2022   CALCIUM 9.8 05/07/2022   ANIONGAP 8 01/26/2019   EGFR 88 05/07/2022   Lab Results  Component Value Date   CHOL 206 (H) 05/07/2022   Lab Results  Component Value Date   HDL 87 05/07/2022   Lab Results  Component Value Date   LDLCALC 107 (H) 05/07/2022   Lab Results  Component Value Date   TRIG 64 05/07/2022   Lab Results  Component Value Date   CHOLHDL 2.4 05/07/2022   Lab Results  Component Value Date   HGBA1C 5.6 05/07/2022      Assessment & Plan:  Dysphagia, idiopathic Assessment & Plan: A referral was placed to GI, and the patient was contacted several times and could not be reached. He reports changing his number and moving out of  We will provide the patient with the contact number for the referral placed to GI for follow up.   Other chronic pain Assessment & Plan: A referral was placed to  pain  management, but the patient  has not follow-up We will provide the patient with a contact number of the referral place for follow-up care Encouraged to take Tylenol as needed for pain   Essential hypertension, benign Assessment & Plan: Controlled Encouraged to continue taking amlodipine 5 mg daily and diltiazem 60 mg twice daily BP Readings from Last 3 Encounters:  11/02/22 127/84  09/06/22 (!) 142/93  08/21/22 136/88     Orders: -     CMP14+EGFR -     CBC with Differential/Platelet -     amLODIPine Besylate; Take 1 tablet (5 mg total) by mouth daily.  Dispense: 90 tablet; Refill: 3  High cholesterol Assessment & Plan:  Encouraged to continue taking pravastatin 40 mg daily Lab Results  Component Value Date   CHOL 206 (H) 05/07/2022   HDL 87 05/07/2022   LDLCALC 107 (H) 05/07/2022   TRIG 64 05/07/2022   CHOLHDL 2.4 05/07/2022      Other hyperlipidemia -     Lipid panel  Gastroesophageal reflux disease without esophagitis Assessment & Plan: Encouraged to continue taking omeprazole 40 mg once daily   IFG (impaired fasting glucose) -     Hemoglobin A1c  Vitamin D deficiency -     VITAMIN D 25 Hydroxy (Vit-D Deficiency, Fractures)  Other specified hypothyroidism -     TSH + free T4    Follow-up: Return in about 3 months (around 02/01/2023).   Alvira Monday, FNP

## 2022-11-03 LAB — CBC WITH DIFFERENTIAL/PLATELET
Basophils Absolute: 0.1 10*3/uL (ref 0.0–0.2)
Basos: 1 %
EOS (ABSOLUTE): 0.2 10*3/uL (ref 0.0–0.4)
Eos: 3 %
Hematocrit: 47.6 % (ref 37.5–51.0)
Hemoglobin: 16.1 g/dL (ref 13.0–17.7)
Immature Grans (Abs): 0 10*3/uL (ref 0.0–0.1)
Immature Granulocytes: 0 %
Lymphocytes Absolute: 2.3 10*3/uL (ref 0.7–3.1)
Lymphs: 36 %
MCH: 31.1 pg (ref 26.6–33.0)
MCHC: 33.8 g/dL (ref 31.5–35.7)
MCV: 92 fL (ref 79–97)
Monocytes Absolute: 0.7 10*3/uL (ref 0.1–0.9)
Monocytes: 11 %
Neutrophils Absolute: 3.1 10*3/uL (ref 1.4–7.0)
Neutrophils: 49 %
Platelets: 393 10*3/uL (ref 150–450)
RBC: 5.18 x10E6/uL (ref 4.14–5.80)
RDW: 12.6 % (ref 11.6–15.4)
WBC: 6.4 10*3/uL (ref 3.4–10.8)

## 2022-11-03 LAB — LIPID PANEL
Chol/HDL Ratio: 2.4 ratio (ref 0.0–5.0)
Cholesterol, Total: 160 mg/dL (ref 100–199)
HDL: 68 mg/dL (ref >39)
LDL Chol Calc (NIH): 79 mg/dL (ref 0–99)
Triglycerides: 63 mg/dL (ref 0–149)
VLDL Cholesterol Cal: 13 mg/dL (ref 5–40)

## 2022-11-03 LAB — CMP14+EGFR
ALT: 17 IU/L (ref 0–44)
AST: 23 IU/L (ref 0–40)
Albumin/Globulin Ratio: 1.7 (ref 1.2–2.2)
Albumin: 4.7 g/dL (ref 3.9–4.9)
Alkaline Phosphatase: 130 IU/L — ABNORMAL HIGH (ref 44–121)
BUN/Creatinine Ratio: 12 (ref 10–24)
BUN: 11 mg/dL (ref 8–27)
Bilirubin Total: 0.4 mg/dL (ref 0.0–1.2)
CO2: 22 mmol/L (ref 20–29)
Calcium: 9.6 mg/dL (ref 8.6–10.2)
Chloride: 103 mmol/L (ref 96–106)
Creatinine, Ser: 0.93 mg/dL (ref 0.76–1.27)
Globulin, Total: 2.8 g/dL (ref 1.5–4.5)
Glucose: 100 mg/dL — ABNORMAL HIGH (ref 70–99)
Potassium: 4.5 mmol/L (ref 3.5–5.2)
Sodium: 142 mmol/L (ref 134–144)
Total Protein: 7.5 g/dL (ref 6.0–8.5)
eGFR: 90 mL/min/{1.73_m2} (ref >59)

## 2022-11-03 LAB — HEMOGLOBIN A1C
Est. average glucose Bld gHb Est-mCnc: 114 mg/dL
Hgb A1c MFr Bld: 5.6 % (ref 4.8–5.6)

## 2022-11-03 LAB — TSH+FREE T4
Free T4: 1.19 ng/dL (ref 0.82–1.77)
TSH: 1.56 u[IU]/mL (ref 0.450–4.500)

## 2022-11-03 LAB — VITAMIN D 25 HYDROXY (VIT D DEFICIENCY, FRACTURES): Vit D, 25-Hydroxy: 26.7 ng/mL — ABNORMAL LOW (ref 30.0–100.0)

## 2022-11-03 NOTE — Progress Notes (Signed)
Please inform the patient that his cholesterol has improved.  His vitamin D is slightly low, I encouraged him to continue taking the vitamin D supplement prescribed.  All other labs are stable.

## 2022-11-05 ENCOUNTER — Ambulatory Visit: Payer: 59 | Admitting: Family Medicine

## 2023-02-01 ENCOUNTER — Encounter: Payer: Self-pay | Admitting: Family Medicine

## 2023-02-01 ENCOUNTER — Ambulatory Visit: Payer: 59 | Admitting: Family Medicine

## 2023-08-28 ENCOUNTER — Other Ambulatory Visit: Payer: Self-pay | Admitting: Otolaryngology

## 2023-08-28 DIAGNOSIS — R1314 Dysphagia, pharyngoesophageal phase: Secondary | ICD-10-CM

## 2023-08-28 DIAGNOSIS — M542 Cervicalgia: Secondary | ICD-10-CM

## 2023-08-28 DIAGNOSIS — Z720 Tobacco use: Secondary | ICD-10-CM

## 2023-08-29 ENCOUNTER — Telehealth (HOSPITAL_COMMUNITY): Payer: Self-pay | Admitting: *Deleted

## 2023-08-29 NOTE — Telephone Encounter (Signed)
Attempted to contact patient to schedule OP MBS. Left VM. RKEEL

## 2023-09-05 ENCOUNTER — Telehealth (HOSPITAL_COMMUNITY): Payer: Self-pay | Admitting: *Deleted

## 2023-09-05 NOTE — Telephone Encounter (Signed)
Attempted to contact patient to schedule OP MBS. Left VM. RKEEL

## 2023-09-10 ENCOUNTER — Telehealth (HOSPITAL_COMMUNITY): Payer: Self-pay | Admitting: *Deleted

## 2023-09-10 NOTE — Telephone Encounter (Signed)
Attempted X3 to contact patient to schedule OP MBS. Left VM. RKEEL

## 2023-09-16 ENCOUNTER — Telehealth (HOSPITAL_COMMUNITY): Payer: Self-pay | Admitting: *Deleted

## 2023-09-16 NOTE — Telephone Encounter (Signed)
Unable to contact patient X3 to schedule OP MBS. Will close order at this time. RKEEL

## 2023-09-24 ENCOUNTER — Other Ambulatory Visit (HOSPITAL_COMMUNITY): Payer: Self-pay | Admitting: Adult Health

## 2023-09-24 DIAGNOSIS — R0600 Dyspnea, unspecified: Secondary | ICD-10-CM

## 2023-09-26 ENCOUNTER — Encounter (INDEPENDENT_AMBULATORY_CARE_PROVIDER_SITE_OTHER): Payer: Self-pay | Admitting: *Deleted

## 2023-10-14 ENCOUNTER — Ambulatory Visit (HOSPITAL_COMMUNITY)
Admission: RE | Admit: 2023-10-14 | Discharge: 2023-10-14 | Disposition: A | Discharge status: Home / Self Care | Payer: 59 | Source: Ambulatory Visit | Attending: Adult Health | Admitting: Adult Health

## 2023-10-14 DIAGNOSIS — R0600 Dyspnea, unspecified: Secondary | ICD-10-CM | POA: Diagnosis present

## 2023-11-12 ENCOUNTER — Telehealth (HOSPITAL_COMMUNITY): Payer: Self-pay | Admitting: *Deleted

## 2023-11-12 NOTE — Telephone Encounter (Signed)
Attempted to contact patient to schedule OP MBS. No answer, left VM requesting a call back from patient. RKEEL

## 2023-11-21 ENCOUNTER — Telehealth (HOSPITAL_COMMUNITY): Payer: Self-pay | Admitting: *Deleted

## 2023-11-21 NOTE — Telephone Encounter (Signed)
Attempted again to contact patient to schedule OP MBS. Left VM on # [phone]. # [phone] not working. RKEEL

## 2023-11-29 ENCOUNTER — Other Ambulatory Visit (HOSPITAL_COMMUNITY): Payer: Self-pay | Admitting: Otolaryngology

## 2023-11-29 DIAGNOSIS — R059 Cough, unspecified: Secondary | ICD-10-CM

## 2023-11-29 DIAGNOSIS — R1310 Dysphagia, unspecified: Secondary | ICD-10-CM

## 2023-12-27 ENCOUNTER — Ambulatory Visit (HOSPITAL_COMMUNITY)
Admission: RE | Admit: 2023-12-27 | Discharge: 2023-12-27 | Disposition: A | Discharge status: Home / Self Care | Payer: Medicare Other | Source: Ambulatory Visit | Attending: *Deleted | Admitting: *Deleted

## 2023-12-27 DIAGNOSIS — R1310 Dysphagia, unspecified: Secondary | ICD-10-CM | POA: Diagnosis not present

## 2023-12-27 DIAGNOSIS — M542 Cervicalgia: Secondary | ICD-10-CM | POA: Diagnosis not present

## 2023-12-27 DIAGNOSIS — R059 Cough, unspecified: Secondary | ICD-10-CM | POA: Diagnosis present

## 2023-12-27 NOTE — Progress Notes (Signed)
Modified Barium Swallow Study  Patient Details  Name: Xavier Livingston MRN: [id number] Date of Birth: July 11, 1956  Today's Date: 12/27/2023  Modified Barium Swallow completed.  Full report located under Chart Review in the Imaging Section.  History of Present Illness Patient reports severe dysphagia, is limiting himself to textures like oatmeal and grits, avoids liquids. He is vague about history, but seems to have had this problem for many years, though severity is worse. He feels intense pressure and need to beat his chest or bend over to make the sensation pass. He feels that liquid stops or choke him. 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 70-month course of a nighttime dose of omeprazole was prescribed to alleviate the throat pain. He was also advised to consume a teaspoon of RefluxRaft, available on [employer], during episodes of throat pain, particularly at night, as it can coat the area and protect it from reflux. Pt denies taking these. Only says he briefly tried Zantac and had to stop because it made him sick.  Pt also ordered to have a CT of neck due to pain on left neck, at thyroid cartilage. Report says: No specific CT evidence to suggest a suspicious mass in the neck or findings to explain patient's symptoms within the limitations of this noncontrast exam.  EGD Impressions 08/21/2021: 2cm hiatal hernia , Abnormal esophageal endoflip, Duodenitis. Pt has a history f ACDF C3-7. Likely performed around 2019.   Clinical Impression Pt demonstrates significant pharyngeal hypersensitivity verbalized as sensation of residue or pressure. Pt is sensitive to touch on his left neck at level of laryngeal cartilage.  Oropharyngeal function appears  normal with only trace residue lining the valleculae at times. Pt however pointed to area of thyroid cartilage repeatedly to indicate globus. SLP used video biofeedback to show pt that there was no residue in his throat, that liquids and solid were moving rapidly through the pharynx. Esophageal sweep with liquids and solids showed only mild distal stasis that cleared quickly. Tablet passed easily into stomach. Suspect that pt has increased laryngeal sensation percieved as residue. Likely there is an inflammatory process from GERD or change in the neural pathway after ACDF. Pt was referred by ENT who recommended medications for reflux, but pt does not report taking these. SLP encouraged pt to f/u with MD about medication and also to be referred to an OP SLP to target pts sensory impairment and food aversions. Today MBS showed no reason to avoid liquids or solids. Factors that may increase risk of adverse event in presence of aspiration Xavier Livingston & Xavier Livingston 2021):    Swallow Evaluation Recommendations Recommendations: PO diet PO Diet Recommendation: Regular;Thin liquids (Level 0) Liquid Administration via: Cup;Straw Medication Administration: Whole meds with liquid      Xavier Livingston, Xavier Livingston 12/27/2023,1:07 PM

## 2023-12-31 NOTE — Progress Notes (Signed)
12/27/23 1200  General Information  HPI Patient reports severe dysphagia, is limiting himself to textures like oatmeal and grits, avoids liquids. He is vague about history, but seems to have ahd this problem for many years, though severity is worse. He feels intense pressure and need to beat his chest or bend over to make the sensation pass. He feels that liquid stop or choke him. 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 68-month course of a nighttime dose of omeprazole was prescribed to alleviate the throat pain. He was also advised to consume a teaspoon of RefluxRaft, available on [employer], during episodes of throat pain, particularly at night, as it can coat the area and protect it from reflux. Pt denies taking these. Only says he briefly tried Zantac and had to stop because it made him sick.  Pt also ordered to ahve a CT of neck due to pain on left neck, at thyroid cartilage. Report says: No specific CT evidence to suggest a suspicious mass in the neck or findings to explain patient's symptoms within the limitations of this noncontrast exam.  EGD Impressions 08/21/2021: 2cm hiatal hernia , Abnormal esophageal endoflip, Duodenitis. Pt has a history f ACDF C3-7. Likely performed around 2019.  Diet Prior to this Study Dysphagia 1 (pureed)  History of Recent Intubation No  Behavior/Cognition Alert;Cooperative;Pleasant mood  Self-Feeding Abilities Able to self-feed  Baseline vocal quality/speech Normal  Volitional Cough Able to elicit  Volitional Cough Assessment Appears WFL  Volitional Swallow Able to elicit  Anatomy WFL  Boluses Administered  Boluses Administered Thin liquids (Level 0);Mildly thick liquids (Level 2, nectar thick);Moderately thick  liquids (Level 3, honey thick);Puree;Solid  Oral Impairment Domain  Lip Closure No labial escape  Tongue control during bolus hold Cohesive bolus between tongue to palatal seal  Bolus preparation/mastication Timely and efficient chewing and mashing  Bolus transport/lingual motion Brisk tongue motion  Oral residue Complete oral clearance  Location of oral residue  N/A  Initiation of pharyngeal swallow  Valleculae  Pharyngeal Impairment Domain  Soft palate elevation No bolus between soft palate (SP)/pharyngeal wall (PW)  Laryngeal elevation Complete superior movement of thyroid cartilage with complete approximation of arytenoids to epiglottic petiole  Anterior hyoid excursion Complete anterior movement  Epiglottic movement Complete inversion  Laryngeal vestibule closure Complete, no air/contrast in laryngeal vestibule  Pharyngeal stripping wave  Present - complete  Pharyngoesophageal segment opening Complete distension and complete duration, no obstruction of flow  Tongue base retraction No contrast between tongue base and posterior pharyngeal wall (PPW)  Pharyngeal residue Trace residue within or on pharyngeal structures  Location of pharyngeal residue Valleculae  Pill  Consistency administered Thin liquids (Level 0)  Penetration/Aspiration Scale Score  1.  Material does not enter airway Thin liquids (Level 0);Mildly thick liquids (Level 2, nectar thick);Moderately thick liquids (Level 3, honey thick);Puree;Solid;Pill  Clinical Impression  Clinical Impression Pt demonstrates significant pharyngeal hypersensitivity verbalized as sensation of residue or pressure. Pt is sensitive to touch on his left neck at level of laryngeal cartilage.  Oropharyngeal function appears normal with only trace residue lining the valleculae at times. Pt however pointed to area of thyroid cartilage repeatedly to indicate globus. SLP used video biofeedback to show pt that there was no residue  in his throat, that  liquids and solid were moving rapidly through the pharynx. Esophageal sweep with liquids and solids showed only mild distal stasis that cleared quickly. Tablet passed easily into stomach. Suspect that pt has increased laryngeal sensation percieved as residue. Likely there is an inflammatory process from GERD or change in the neural pathway after ACDF. Pt was referred by ENT who recommended medications for reflux, but pt does not report taking these. SLP encouraged pt to f/u with MD about medication and also to be referred to an OP SLP to target pts sensory impairment and food aversions. Today MBS showed no reason to avoid liquids or solids.  SLP Visit Diagnosis Dysphagia, oropharyngeal phase (R13.12)  Swallowing Evaluation Recommendations  Recommendations PO diet  PO Diet Recommendation Regular;Thin liquids (Level 0)  Liquid Administration via Cup;Straw  Medication Administration Whole meds with liquid  Treatment Plan  Treatment recommendations Defer treatment plan to SLP at other venue (see follow-up recommendations)  Follow-up recommendations Outpatient SLP  Goal Planning  Prognosis for improved oropharyngeal function Good  Barriers to Reach Goals Time post onset;Medication  SLP Time Calculation  SLP Start Time (ACUTE ONLY) 1145  SLP Stop Time (ACUTE ONLY) 1210  SLP Time Calculation (min) (ACUTE ONLY) 25 min  SLP Evaluations  $ SLP Speech Visit 1 Visit  SLP Evaluations  $Outpatient MBS Swallow 1 Procedure  $Swallowing Treatment 1 Procedure

## 2024-03-26 ENCOUNTER — Encounter (INDEPENDENT_AMBULATORY_CARE_PROVIDER_SITE_OTHER): Payer: Self-pay | Admitting: *Deleted

## 2024-07-15 ENCOUNTER — Encounter (INDEPENDENT_AMBULATORY_CARE_PROVIDER_SITE_OTHER): Payer: Self-pay | Admitting: *Deleted

## 2024-10-19 ENCOUNTER — Ambulatory Visit (INDEPENDENT_AMBULATORY_CARE_PROVIDER_SITE_OTHER)

## 2024-10-19 ENCOUNTER — Encounter (INDEPENDENT_AMBULATORY_CARE_PROVIDER_SITE_OTHER): Payer: Self-pay

## 2024-10-19 VITALS — BP 128/79 | HR 67 | Ht 71.0 in | Wt 182.0 lb

## 2024-10-19 DIAGNOSIS — R1310 Dysphagia, unspecified: Secondary | ICD-10-CM

## 2024-10-19 DIAGNOSIS — R221 Localized swelling, mass and lump, neck: Secondary | ICD-10-CM | POA: Diagnosis not present

## 2024-10-19 DIAGNOSIS — R200 Anesthesia of skin: Secondary | ICD-10-CM | POA: Diagnosis not present

## 2024-10-19 NOTE — Progress Notes (Signed)
 Dear Dr. Benjamin, Here is my assessment for our mutual patient, Xavier Livingston. Thank you for allowing me the opportunity to care for your patient. Please do not hesitate to contact me should you have any other questions. Sincerely, Dr. Hadassah Parody  Otolaryngology Clinic Note Referring provider: Dr. Benjamin HPI:   Initial HPI (10/19/2024) Discussed the use of AI scribe software for clinical note transcription with the patient, who gave verbal consent to proceed.  History of Present Illness Xavier Livingston is a 68 year old male who presents with neck pain, tongue numbness, and dysphagia.   This is ongoing at least over the last year. Saw another ENT for the same complaint 08/26/23.   Neck pain and swelling - Neck pain present for three months per pt but per chart review has been at least a year.  - Pain occurs primarily during swallowing. - Mild neck swelling noted on left side  - History of three neck (spine) surgeries - Was recommended to get a CT by previous ENT but he never got this.  - Unilateral ear pain associated with throat swelling. - No changes in voice.  Tongue numbness and dysgeusia - Numbness and tingling affect the entire tongue for three months. - Burning sensation present. - Difficulty tasting certain foods.  Oropharyngeal dysphagia - Occasional difficulty swallowing, requiring bending over and pushing to facilitate swallowing. - Swallowing difficulties have persisted for fifteen to twenty years, with stable symptoms recently. - Pain with swallowing, not with speaking. - Previous swallow tests performed; esophagram showed normal MBS but with esophageal dysmotility, known and seen since 2021.   Tobacco use - Significant smoking history of fifty years. - Currently smokes one cigarette per day.     Independent Review of Additional Tests or Records:  Referral from Kalombo Nsumanganyi (09/04/24):  Referral to ENT for throat pain   Note by Marcey Coddington, MD (08/26/23): seen for dysphagia and left neck pain. Recommended CT scan. Dysphagia - ordered esophagram and recommended f/u with GI.    PMH/Meds/All/SocHx/FamHx/ROS:   Past Medical History:  Diagnosis Date   Anxiety    Arthritis    Dyspnea    Dysrhythmia    irregular heartbeat in the 80's   Enlarged prostate    GERD (gastroesophageal reflux disease)    Headache    Hyperlipemia    Hypertension    Nocturia more than twice per night    Reflux esophagitis    Seizures (HCC)    last at age 89 or 68 years old   Smoker within last 12 months    Swallowing difficulty      Past Surgical History:  Procedure Laterality Date   ANTERIOR CERVICAL DECOMPRESSION/DISCECTOMY FUSION 4 LEVELS N/A 04/11/2015   Procedure: Anterior Cervical Discectomy/ Decompression Fusion Cervical three-four, Cervical four-five, Cervical five-six, Cervical six- seven;  Surgeon: Arley Helling, MD;  [facility]: [facility];  Service: Neurosurgery;  Laterality: N/A;   BACK SURGERY     BACK SURGERY  08/2018   BIOPSY  09/29/2020   Procedure: BIOPSY;  Surgeon: Golda Claudis PENNER, MD;  [facility];  Service: Endoscopy;;   CATARACT EXTRACTION W/PHACO Right 03/02/2022   Procedure: CATARACT EXTRACTION PHACO AND INTRAOCULAR LENS PLACEMENT (IOC);  Surgeon: Harrie Agent, MD;  Location: [facility];  Service: Ophthalmology;  Laterality: Right;  CDE: 9.02   CATARACT EXTRACTION W/PHACO Left 03/23/2022   Procedure: CATARACT EXTRACTION PHACO AND INTRAOCULAR LENS PLACEMENT (IOC);  Surgeon: Harrie Agent, MD;  [facility]: [facility];  Service: Ophthalmology;  Laterality: Left;  CDE: 7.68   COLONOSCOPY N/A 08/18/2014   Procedure: COLONOSCOPY;  Surgeon: Claudis RAYMOND Rivet, MD;  [facility];  Service: Endoscopy;  Laterality: N/A;  1030-moved to 1200 Ann to notify pt   ESOPHAGEAL DILATION N/A 11/29/2016   Procedure: ESOPHAGEAL DILATION;  Surgeon: Claudis RAYMOND Rivet, MD;  [facility];  Service: Endoscopy;  Laterality:  N/A;   ESOPHAGEAL DILATION N/A 05/16/2017   Procedure: ESOPHAGEAL DILATION;  Surgeon: Rivet Claudis RAYMOND, MD;  [facility];  Service: Endoscopy;  Laterality: N/A;   ESOPHAGEAL DILATION N/A 07/29/2019   Procedure: ESOPHAGEAL DILATION;  Surgeon: Rivet Claudis RAYMOND, MD;  [facility];  Service: Endoscopy;  Laterality: N/A;   ESOPHAGOGASTRODUODENOSCOPY N/A 08/18/2014   Procedure: ESOPHAGOGASTRODUODENOSCOPY (EGD);  Surgeon: Claudis RAYMOND Rivet, MD;  [facility];  Service: Endoscopy;  Laterality: N/A;   ESOPHAGOGASTRODUODENOSCOPY N/A 11/29/2016   Procedure: ESOPHAGOGASTRODUODENOSCOPY (EGD);  Surgeon: Claudis RAYMOND Rivet, MD;  [facility];  Service: Endoscopy;  Laterality: N/A;  12:00   ESOPHAGOGASTRODUODENOSCOPY N/A 07/29/2019   Procedure: ESOPHAGOGASTRODUODENOSCOPY (EGD);  Surgeon: Rivet Claudis RAYMOND, MD;  [facility];  Service: Endoscopy;  Laterality: N/A;  2:00   ESOPHAGOGASTRODUODENOSCOPY N/A 09/29/2020   Procedure: ESOPHAGOGASTRODUODENOSCOPY (EGD);  Surgeon: Rivet Claudis RAYMOND, MD;  [facility];  Service: Endoscopy;  Laterality: N/A;  130   ESOPHAGOGASTRODUODENOSCOPY (EGD) WITH PROPOFOL  N/A 05/16/2017   Procedure: ESOPHAGOGASTRODUODENOSCOPY (EGD) WITH PROPOFOL ;  Surgeon: Rivet Claudis RAYMOND, MD;  [facility];  Service: Endoscopy;  Laterality: N/A;  8:30   excision of mass of back     MALONEY DILATION  08/18/2014   Procedure: MALONEY DILATION;  Surgeon: Claudis RAYMOND Rivet, MD;  [facility];  Service: Endoscopy;;   MASS EXCISION N/A 10/21/2013   Procedure: EXCISION MASSES X 2 ON BACK;  Surgeon: Elsie GORMAN Holland, MD;  [facility]: [facility];  Service: General;  Laterality: N/A;   NASAL SEPTUM SURGERY     NECK SURGERY  2019   NECK SURGERY  2020   POSTERIOR CERVICAL FUSION/FORAMINOTOMY N/A 02/05/2018   Procedure: Posterior Cervical Fusion with lateral mass fixation - Cervical six-seven;  Surgeon: Onetha Kuba, MD;  Location: [facility];   Service: Neurosurgery;  Laterality: N/A;   POSTERIOR CERVICAL FUSION/FORAMINOTOMY N/A 02/06/2019   Procedure: Posterior Cervical Fusion with lateral mass fixation redo C6-T1;  Surgeon: Onetha Kuba, MD;  Location: [facility];  Service: Neurosurgery;  Laterality: N/A;   SHOULDER ARTHROSCOPY WITH ROTATOR CUFF REPAIR Left 09/01/2019   Procedure: ARTHROSCOPY SHOULDER ROTATOR CUFF REPAIR;  Surgeon: Dozier Soulier, MD;  Location: [facility];  Service: Orthopedics;  Laterality: Left;   SHOULDER ARTHROSCOPY WITH SUBACROMIAL DECOMPRESSION Left 09/01/2019   Procedure: SHOULDER ARTHROSCOPY WITH SUBACROMIAL DECOMPRESSION;  Surgeon: Dozier Soulier, MD;  Location: [facility];  Service: Orthopedics;  Laterality: Left;    No family history on file.   Social Connections: Not on file     Current Outpatient Medications  Medication Instructions   amLODipine  (NORVASC ) 5 mg, Oral, Daily   betamethasone valerate ointment (VALISONE) 0.1 % Topical, As needed   cholecalciferol (VITAMIN D3) 1,000 Units, Oral, Daily   diltiazem  (CARDIZEM  SR) 60 mg, Oral, 2 times daily   HUMIRA, 2 PEN, 40 MG/0.4ML pen 0.4 mLs, As directed   ILEVRO 0.3 % ophthalmic suspension SMARTSIG:In Eye(s)   moxifloxacin (VIGAMOX) 0.5 % ophthalmic solution Ophthalmic   omeprazole  (PRILOSEC) 40 MG capsule TAKE (1) CAPSULE BY  MOUTH ONCE DAILY.   pravastatin  (PRAVACHOL ) 40 mg, Daily   prednisoLONE acetate (PRED FORTE) 1 % ophthalmic suspension 1 drop, 3 times daily     Physical Exam:   BP 128/79   Pulse 67   Ht 5' 11 (1.803 m)   Wt 182 lb (82.6 kg)   SpO2 95%   BMI 25.38 kg/m   Salient findings:  CN II-XII intact  Bilateral EAC clear and TM intact with well pneumatized middle ear spaces No lesions of oral cavity/oropharynx; upper and lower dentures  Slight left neck swelling; soft and very painful to palpation at superior aspect of left SCM. No skin changes, no other masses or lymphadenopathy  No respiratory  distress or stridor Recommended pt undergo TFL given significant smoking history, pain with swallowing and left neck pain/ swelling. He declined. Discussed that it was important to do this exam to evaluate for possible cancer. Pt states he has had this done before and he does not want this performed today.   Seprately Identifiable Procedures:  Prior to initiating any procedures, risks/benefits/alternatives were explained to the patient and verbal consent obtained. None  Impression & Plans:  Xavier Livingston is a 68 y.o. male with   1. Mass of left side of neck    Assessment and Plan Assessment & Plan Chronic neck pain and swelling with odnophagia and tongue numbness Chronic neck pain and swelling with odnophagia and tongue numbness for over a year. Concern for malignancy due to smoking history. Previous scope exam was normal 1 year ago. Recommended pt undergo TFL given significant smoking history, pain with swallowing and left neck pain/ swelling. He declined. Discussed that it was important to do this exam to evaluate for possible cancer. Pt states he has had this done before and he does not want this performed today.  - Ordered CT scan of the neck. - Scheduled follow-up in one month. - Reassuring given that symptoms have not changed and has been chronic but discussed still need to do further workup - Advised to call if symptoms worsen.   See below regarding exact medications prescribed this encounter including dosages and route: No orders of the defined types were placed in this encounter.  I personally spent a total of 48 minutes in the care of the patient today including preparing to see the patient, getting/reviewing separately obtained history, and counseling and educating. Had thorough discussion regarding my recommendation for scope exam but pt declined.    Thank you for allowing me the opportunity to care for your patient. Please do not hesitate to contact me should you have any  other questions.  Sincerely, Hadassah Parody, MD Otolaryngologist (ENT), [facility] Phone: [phone] Fax: [phone]

## 2024-10-28 ENCOUNTER — Ambulatory Visit (HOSPITAL_BASED_OUTPATIENT_CLINIC_OR_DEPARTMENT_OTHER)
Admission: RE | Admit: 2024-10-28 | Discharge: 2024-10-28 | Disposition: A | Discharge status: Home / Self Care | Source: Ambulatory Visit

## 2024-10-28 DIAGNOSIS — R221 Localized swelling, mass and lump, neck: Secondary | ICD-10-CM | POA: Diagnosis present

## 2024-10-28 DIAGNOSIS — Z981 Arthrodesis status: Secondary | ICD-10-CM | POA: Diagnosis not present

## 2024-10-28 MED ORDER — IOHEXOL 300 MG/ML  SOLN
75.0000 mL | Freq: Once | INTRAMUSCULAR | Status: AC | PRN
  Administered 2024-10-28: 75 mL via INTRAVENOUS

## 2024-11-19 ENCOUNTER — Ambulatory Visit (INDEPENDENT_AMBULATORY_CARE_PROVIDER_SITE_OTHER)

## 2024-11-19 ENCOUNTER — Telehealth (INDEPENDENT_AMBULATORY_CARE_PROVIDER_SITE_OTHER): Payer: Self-pay

## 2024-11-19 NOTE — Telephone Encounter (Signed)
 Called patient to let them know that Dr. Greggory did not feel comfortable prescribing medication with out first seeing the patient to see what is going on. Patient understood and stated he would wait until Monday 11/23/2024 when he sees Dr. Greggory.

## 2024-11-19 NOTE — Telephone Encounter (Signed)
 The patient had to reschedule his appointment this am because his transportation did not show this am.  He is experiencing mouth and tongue are numb and when drinking anything it burns. His throat is hurting worse since his last visit.  He is wanting to know if there is any medication or advice to get him through to 11/23/24 where we moved his appointment?  He uses [facility] in [place] for his pharmacy.

## 2024-11-23 ENCOUNTER — Ambulatory Visit (INDEPENDENT_AMBULATORY_CARE_PROVIDER_SITE_OTHER)

## 2024-11-23 ENCOUNTER — Encounter (INDEPENDENT_AMBULATORY_CARE_PROVIDER_SITE_OTHER): Payer: Self-pay

## 2024-11-23 VITALS — BP 124/79 | HR 80 | Temp 97.5°F

## 2024-11-23 DIAGNOSIS — B370 Candidal stomatitis: Secondary | ICD-10-CM

## 2024-11-23 DIAGNOSIS — R200 Anesthesia of skin: Secondary | ICD-10-CM

## 2024-11-23 DIAGNOSIS — M542 Cervicalgia: Secondary | ICD-10-CM

## 2024-11-23 DIAGNOSIS — K146 Glossodynia: Secondary | ICD-10-CM

## 2024-11-23 MED ORDER — NYSTATIN 100000 UNIT/ML MT SUSP: 5.0000 mL | Freq: Four times a day (QID) | OROMUCOSAL | 0 refills | Status: AC

## 2024-11-23 MED ORDER — GABAPENTIN 100 MG PO CAPS: 100.0000 mg | ORAL_CAPSULE | Freq: Three times a day (TID) | ORAL | 3 refills | Status: AC

## 2024-11-25 NOTE — Progress Notes (Signed)
 Dear Dr. Benjamin, Here is my assessment for our mutual patient, Xavier Livingston. Thank you for allowing me the opportunity to care for your patient. Please do not hesitate to contact me should you have any other questions. Sincerely, Dr. Hadassah Parody  Otolaryngology Clinic Note Referring provider: Dr. Benjamin HPI:   Initial HPI (10/19/2024) Discussed the use of AI scribe software for clinical note transcription with the patient, who gave verbal consent to proceed.  JAHDEN Livingston is a 68 year old male who presents with neck pain, tongue numbness, and dysphagia.   This is ongoing at least over the last year. Saw another ENT for the same complaint 08/26/23.   Neck pain and swelling - Neck pain present for three months per pt but per chart review has been at least a year.  - Pain occurs primarily during swallowing. - Mild neck swelling noted on left side  - History of three neck (spine) surgeries - Was recommended to get a CT by previous ENT but he never got this.  - Unilateral ear pain associated with throat swelling. - No changes in voice.  Tongue numbness and dysgeusia - Numbness and tingling affect the entire tongue for three months. - Burning sensation present. - Difficulty tasting certain foods.  Oropharyngeal dysphagia - Occasional difficulty swallowing, requiring bending over and pushing to facilitate swallowing. - Swallowing difficulties have persisted for fifteen to twenty years, with stable symptoms recently. - Pain with swallowing, not with speaking. - Previous swallow tests performed; esophagram showed normal MBS but with esophageal dysmotility, known and seen since 2021.   History of Present Illness --------------------------------------------------------- 11/23/2024    Returns for follow-up after CT scan.  CT showed no abnormalities that would explain his neck pain.  His main concern today is oral burning and paresthesia in the oral cavity.  He has  continued burning and numbness involving his entire tongue and extending to the lips.  Burning sensation markedly exacerbated by ingestion of any food or liquid.  At rest, the numbness predominates.  He does have a history of esophageal dysmotility as well and follows with a gastroenterologist   Tobacco use - Significant smoking history of fifty years. - Currently smokes one cigarette per day.  Independent Review of Additional Tests or Records:  Referral from Kalombo Nsumanganyi (09/04/24):  Referral to ENT for throat pain   Note by Marcey Coddington, MD (08/26/23): seen for dysphagia and left neck pain. Recommended CT scan. Dysphagia - ordered esophagram and recommended f/u with GI.    PMH/Meds/All/SocHx/FamHx/ROS:   Past Medical History:  Diagnosis Date   Anxiety    Arthritis    Dyspnea    Dysrhythmia    irregular heartbeat in the 80's   Enlarged prostate    GERD (gastroesophageal reflux disease)    Headache    Hyperlipemia    Hypertension    Nocturia more than twice per night    Reflux esophagitis    Seizures (HCC)    last at age 46 or 68 years old   Smoker within last 12 months    Swallowing difficulty      Past Surgical History:  Procedure Laterality Date   ANTERIOR CERVICAL DECOMPRESSION/DISCECTOMY FUSION 4 LEVELS N/A 04/11/2015   Procedure: Anterior Cervical Discectomy/ Decompression Fusion Cervical three-four, Cervical four-five, Cervical five-six, Cervical six- seven;  Surgeon: Arley Helling, MD;  Location: [facility];  Service: Neurosurgery;  Laterality: N/A;   BACK SURGERY     BACK SURGERY  08/2018   BIOPSY  09/29/2020   Procedure: BIOPSY;  Surgeon: Golda Claudis PENNER, MD;  [facility];  Service: Endoscopy;;   CATARACT EXTRACTION W/PHACO Right 03/02/2022   Procedure: CATARACT EXTRACTION PHACO AND INTRAOCULAR LENS PLACEMENT (IOC);  Surgeon: Harrie Agent, MD;  [facility]: [facility];  Service: Ophthalmology;  Laterality: Right;  CDE: 9.02   CATARACT EXTRACTION  W/PHACO Left 03/23/2022   Procedure: CATARACT EXTRACTION PHACO AND INTRAOCULAR LENS PLACEMENT (IOC);  Surgeon: Harrie Agent, MD;  [facility]: [facility];  Service: Ophthalmology;  Laterality: Left;  CDE: 7.68   COLONOSCOPY N/A 08/18/2014   Procedure: COLONOSCOPY;  Surgeon: Claudis PENNER Golda, MD;  [facility];  Service: Endoscopy;  Laterality: N/A;  1030-moved to 1200 Ann to notify pt   ESOPHAGEAL DILATION N/A 11/29/2016   Procedure: ESOPHAGEAL DILATION;  Surgeon: Claudis PENNER Golda, MD;  [facility];  Service: Endoscopy;  Laterality: N/A;   ESOPHAGEAL DILATION N/A 05/16/2017   Procedure: ESOPHAGEAL DILATION;  Surgeon: Golda Claudis PENNER, MD;  [facility];  Service: Endoscopy;  Laterality: N/A;   ESOPHAGEAL DILATION N/A 07/29/2019   Procedure: ESOPHAGEAL DILATION;  Surgeon: Golda Claudis PENNER, MD;  [facility];  Service: Endoscopy;  Laterality: N/A;   ESOPHAGOGASTRODUODENOSCOPY N/A 08/18/2014   Procedure: ESOPHAGOGASTRODUODENOSCOPY (EGD);  Surgeon: Claudis PENNER Golda, MD;  [facility];  Service: Endoscopy;  Laterality: N/A;   ESOPHAGOGASTRODUODENOSCOPY N/A 11/29/2016   Procedure: ESOPHAGOGASTRODUODENOSCOPY (EGD);  Surgeon: Claudis PENNER Golda, MD;  [facility];  Service: Endoscopy;  Laterality: N/A;  12:00   ESOPHAGOGASTRODUODENOSCOPY N/A 07/29/2019   Procedure: ESOPHAGOGASTRODUODENOSCOPY (EGD);  Surgeon: Golda Claudis PENNER, MD;  [facility];  Service: Endoscopy;  Laterality: N/A;  2:00   ESOPHAGOGASTRODUODENOSCOPY N/A 09/29/2020   Procedure: ESOPHAGOGASTRODUODENOSCOPY (EGD);  Surgeon: Golda Claudis PENNER, MD;  [facility];  Service: Endoscopy;  Laterality: N/A;  130   ESOPHAGOGASTRODUODENOSCOPY (EGD) WITH PROPOFOL  N/A 05/16/2017   Procedure: ESOPHAGOGASTRODUODENOSCOPY (EGD) WITH PROPOFOL ;  Surgeon: Golda Claudis PENNER, MD;  [facility];  Service: Endoscopy;  Laterality: N/A;  8:30   excision of mass of back      MALONEY DILATION  08/18/2014   Procedure: MALONEY DILATION;  Surgeon: Claudis PENNER Golda, MD;  [facility];  Service: Endoscopy;;   MASS EXCISION N/A 10/21/2013   Procedure: EXCISION MASSES X 2 ON BACK;  Surgeon: Elsie GORMAN Holland, MD;  [facility]: [facility];  Service: General;  Laterality: N/A;   NASAL SEPTUM SURGERY     NECK SURGERY  2019   NECK SURGERY  2020   POSTERIOR CERVICAL FUSION/FORAMINOTOMY N/A 02/05/2018   Procedure: Posterior Cervical Fusion with lateral mass fixation - Cervical six-seven;  Surgeon: Onetha Kuba, MD;  Location: [facility];  Service: Neurosurgery;  Laterality: N/A;   POSTERIOR CERVICAL FUSION/FORAMINOTOMY N/A 02/06/2019   Procedure: Posterior Cervical Fusion with lateral mass fixation redo C6-T1;  Surgeon: Onetha Kuba, MD;  Location: [facility];  Service: Neurosurgery;  Laterality: N/A;   SHOULDER ARTHROSCOPY WITH ROTATOR CUFF REPAIR Left 09/01/2019   Procedure: ARTHROSCOPY SHOULDER ROTATOR CUFF REPAIR;  Surgeon: Dozier Soulier, MD;  Location: [facility];  Service: Orthopedics;  Laterality: Left;   SHOULDER ARTHROSCOPY WITH SUBACROMIAL DECOMPRESSION Left 09/01/2019   Procedure: SHOULDER ARTHROSCOPY WITH SUBACROMIAL DECOMPRESSION;  Surgeon: Dozier Soulier, MD;  Location: [facility];  Service: Orthopedics;  Laterality: Left;    No family history on file.   Social Connections: Not on file  Current Outpatient Medications  Medication Instructions   amLODipine  (NORVASC ) 5 mg, Oral, Daily   betamethasone valerate ointment (VALISONE) 0.1 % As needed   cholecalciferol (VITAMIN D3) 1,000 Units, Daily   diltiazem  (CARDIZEM  SR) 60 mg, 2 times daily   gabapentin  (NEURONTIN ) 100 mg, Oral, 3 times daily   HUMIRA, 2 PEN, 40 MG/0.4ML pen 0.4 mLs, As directed   ILEVRO 0.3 % ophthalmic suspension SMARTSIG:In Eye(s)   moxifloxacin (VIGAMOX) 0.5 % ophthalmic solution Apply to eye.   nystatin  (MYCOSTATIN ) 500,000 Units, Oral, 4 times daily    omeprazole  (PRILOSEC) 40 MG capsule TAKE (1) CAPSULE BY MOUTH ONCE DAILY.   pravastatin  (PRAVACHOL ) 40 mg, Daily   prednisoLONE acetate (PRED FORTE) 1 % ophthalmic suspension 1 drop, 3 times daily     Physical Exam:   BP 124/79 (BP Location: Right Arm, Patient Position: Sitting)   Pulse 80   Temp (!) 97.5 F (36.4 C)   SpO2 95%   Salient findings:  CN II-XII intact with the exception of tongue numbness   Bilateral EAC clear and TM intact with well pneumatized middle ear spaces  Oral mucosa moist, upper and lower dentures -today has white patches on his soft palate, likely Candida.  No other lesions noted.  No neck swelling today.  Less painful to palpation at superior aspect of SCM today.  No skin changes or other masses noted.  No respiratory distress or stridor  Seprately Identifiable Procedures:  Prior to initiating any procedures, risks/benefits/alternatives were explained to the patient and verbal consent obtained. None  Impression & Plans:  Kazim Corrales is a 68 y.o. male with   1. Burning mouth syndrome   2. Numbness of tongue   3. Oral candidiasis   4. Neck pain     Assessment and Plan Assessment & Plan Chronic neck pain and swelling with odnophagia and tongue numbness Chronic neck pain and swelling with odnophagia and tongue numbness for over a year. Concern for malignancy due to smoking history. Previous scope exam was normal 1 year ago. Recommended pt undergo TFL given significant smoking history, pain with swallowing and left neck pain/ swelling. He declined. Discussed that it was important to do this exam to evaluate for possible cancer. Pt states he has had this done before and he does not want this performed today.  -CT neck on 10/28/2024 reassuring with no concerning neck findings -Today and neck pain seems less a concern and the oral burning and paresthesia is more concerning to him.  Will continue monitoring the neck pain  Bilateral tongue numbness and  burning Burning mouth syndrome Chronic bilateral burning and numbness of the tongue and oral cavity, exacerbated by eating and drinking, consistent with burning mouth syndrome. Bilateral distribution makes a focal cranial nerve lesion unlikely.  We did discuss getting an MRI but he wants to defer this for now.  We discussed burning mouth syndrome and at this time he thinks his symptoms are most consistent with this. Discussed the difficulties with treating burning mouth syndrome. -Will start with trialing gabapentin .  initiated at a low dose at night to assess tolerance and minimize drowsiness. - Provided education on potential triggers, including spices, toothpaste, alcohol, and acidic foods, and advised avoidance. - Scheduled follow-up in a few weeks to assess response to therapy and review symptoms. - Discussed MRI for cranial nerve evaluation if symptoms persist or worsen, but deferred for now.  Oral candidiasis Clinical evidence of oral candidiasis with persistent white tongue and prior unsuccessful treatment. Candidiasis  may contribute to oral symptoms. - Prescribed antifungal mouthwash to be used by swishing and spitting, especially after removing dentures. - Instructed to maintain denture hygiene and use mouthwash as directed. - Scheduled follow-up in a few weeks to reassess candidiasis and oral symptoms.   See below regarding exact medications prescribed this encounter including dosages and route: Meds ordered this encounter  Medications   gabapentin  (NEURONTIN ) 100 MG capsule    Sig: Take 1 capsule (100 mg total) by mouth 3 (three) times daily.    Dispense:  90 capsule    Refill:  3   nystatin  (MYCOSTATIN ) 100000 UNIT/ML suspension    Sig: Take 5 mLs (500,000 Units total) by mouth 4 (four) times daily.    Dispense:  60 mL    Refill:  0   I personally spent a total of 48 minutes in the care of the patient today including preparing to see the patient, getting/reviewing separately  obtained history, and counseling and educating. Had thorough discussion regarding my recommendation for scope exam but pt declined.    Thank you for allowing me the opportunity to care for your patient. Please do not hesitate to contact me should you have any other questions.  Sincerely, Hadassah Parody, MD Otolaryngologist (ENT), [facility] Phone: [phone] Fax: [phone]   MDM:  Level 4 Complexity/Problems addressed: 4-chronic worsening problem Data complexity: 4-  independent review of CT scan - Morbidity: 4-prescription drug management - Prescription Drug prescribed or managed: Yes

## 2024-12-14 ENCOUNTER — Ambulatory Visit (INDEPENDENT_AMBULATORY_CARE_PROVIDER_SITE_OTHER)

## 2024-12-14 ENCOUNTER — Encounter (INDEPENDENT_AMBULATORY_CARE_PROVIDER_SITE_OTHER): Payer: Self-pay

## 2024-12-14 VITALS — BP 125/81 | HR 81

## 2024-12-14 DIAGNOSIS — L439 Lichen planus, unspecified: Secondary | ICD-10-CM

## 2024-12-14 DIAGNOSIS — M542 Cervicalgia: Secondary | ICD-10-CM | POA: Diagnosis not present

## 2024-12-14 DIAGNOSIS — K146 Glossodynia: Secondary | ICD-10-CM

## 2024-12-14 NOTE — Progress Notes (Unsigned)
 Dear Dr. Benjamin, Here is my assessment for our mutual patient, Xavier Livingston. Thank you for allowing me the opportunity to care for your patient. Please do not hesitate to contact me should you have any other questions. Sincerely, Dr. Hadassah Parody  Otolaryngology Clinic Note Referring provider: Dr. Benjamin HPI:   Initial HPI (10/19/2024) Discussed the use of AI scribe software for clinical note transcription with the patient, who gave verbal consent to proceed.  Xavier Livingston is a 69 year old male who presents with neck pain, tongue numbness, and dysphagia.   This is ongoing at least over the last year. Saw another ENT for the same complaint 08/26/23.   Neck pain and swelling - Neck pain present for three months per pt but per chart review has been at least a year.  - Pain occurs primarily during swallowing. - Mild neck swelling noted on left side  - History of three neck (spine) surgeries - Was recommended to get a CT by previous ENT but he never got this.  - Unilateral ear pain associated with throat swelling. - No changes in voice.  Tongue numbness and dysgeusia - Numbness and tingling affect the entire tongue for three months. - Burning sensation present. - Difficulty tasting certain foods.  Oropharyngeal dysphagia - Occasional difficulty swallowing, requiring bending over and pushing to facilitate swallowing. - Swallowing difficulties have persisted for fifteen to twenty years, with stable symptoms recently. - Pain with swallowing, not with speaking. - Previous swallow tests performed; esophagram showed normal MBS but with esophageal dysmotility, known and seen since 2021.   --------------------------------------------------------- 11/23/2024    Returns for follow-up after CT scan.  CT showed no abnormalities that would explain his neck pain.  His main concern today is oral burning and paresthesia in the oral cavity.  He has continued burning and numbness  involving his entire tongue and extending to the lips.  Burning sensation markedly exacerbated by ingestion of any food or liquid.  At rest, the numbness predominates.  He does have a history of esophageal dysmotility as well and follows with a gastroenterologist  --------------------------------------------------------- 12/14/2024  Returns for follow-up.  Trialing gabapentin  did not help.  He used mouthwash for the candidiasis but overall did not help with the burning sensation.  He is now having increased pain burning and numbness on the inside of his cheeks.  No oral bleeding.  Overall feels like things are getting worse.   ------ Tobacco use - Significant smoking history of fifty years. - Currently smokes one cigarette per day.  Independent Review of Additional Tests or Records:  Referral from Kalombo Nsumanganyi (09/04/24):  Referral to ENT for throat pain   Note by Marcey Coddington, MD (08/26/23): seen for dysphagia and left neck pain. Recommended CT scan. Dysphagia - ordered esophagram and recommended f/u with GI.    PMH/Meds/All/SocHx/FamHx/ROS:   Past Medical History:  Diagnosis Date   Anxiety    Arthritis    Dyspnea    Dysrhythmia    irregular heartbeat in the 80's   Enlarged prostate    GERD (gastroesophageal reflux disease)    Headache    Hyperlipemia    Hypertension    Nocturia more than twice per night    Reflux esophagitis    Seizures (HCC)    last at age 68 or 69 years old   Smoker within last 12 months    Swallowing difficulty      Past Surgical History:  Procedure Laterality Date   ANTERIOR CERVICAL DECOMPRESSION/DISCECTOMY  FUSION 4 LEVELS N/A 04/11/2015   Procedure: Anterior Cervical Discectomy/ Decompression Fusion Cervical three-four, Cervical four-five, Cervical five-six, Cervical six- seven;  Surgeon: Arley Helling, MD;  Location: [facility];  Service: Neurosurgery;  Laterality: N/A;   BACK SURGERY     BACK SURGERY  08/2018   BIOPSY  09/29/2020    Procedure: BIOPSY;  Surgeon: Golda Claudis PENNER, MD;  Location: [facility];  Service: Endoscopy;;   CATARACT EXTRACTION W/PHACO Right 03/02/2022   Procedure: CATARACT EXTRACTION PHACO AND INTRAOCULAR LENS PLACEMENT (IOC);  Surgeon: Harrie Agent, MD;  Location: [facility];  Service: Ophthalmology;  Laterality: Right;  CDE: 9.02   CATARACT EXTRACTION W/PHACO Left 03/23/2022   Procedure: CATARACT EXTRACTION PHACO AND INTRAOCULAR LENS PLACEMENT (IOC);  Surgeon: Harrie Agent, MD;  Location: [facility];  Service: Ophthalmology;  Laterality: Left;  CDE: 7.68   COLONOSCOPY N/A 08/18/2014   Procedure: COLONOSCOPY;  Surgeon: Claudis PENNER Golda, MD;  Location: [facility];  Service: Endoscopy;  Laterality: N/A;  1030-moved to 1200 Ann to notify pt   ESOPHAGEAL DILATION N/A 11/29/2016   Procedure: ESOPHAGEAL DILATION;  Surgeon: Claudis PENNER Golda, MD;  [facility]: [facility];  Service: Endoscopy;  Laterality: N/A;   ESOPHAGEAL DILATION N/A 05/16/2017   Procedure: ESOPHAGEAL DILATION;  Surgeon: Golda Claudis PENNER, MD;  Location: [facility];  Service: Endoscopy;  Laterality: N/A;   ESOPHAGEAL DILATION N/A 07/29/2019   Procedure: ESOPHAGEAL DILATION;  Surgeon: Golda Claudis PENNER, MD;  [facility]: [facility];  Service: Endoscopy;  Laterality: N/A;   ESOPHAGOGASTRODUODENOSCOPY N/A 08/18/2014   Procedure: ESOPHAGOGASTRODUODENOSCOPY (EGD);  Surgeon: Claudis PENNER Golda, MD;  [facility]: [facility];  Service: Endoscopy;  Laterality: N/A;   ESOPHAGOGASTRODUODENOSCOPY N/A 11/29/2016   Procedure: ESOPHAGOGASTRODUODENOSCOPY (EGD);  Surgeon: Claudis PENNER Golda, MD;  [facility]: [facility];  Service: Endoscopy;  Laterality: N/A;  12:00   ESOPHAGOGASTRODUODENOSCOPY N/A 07/29/2019   Procedure: ESOPHAGOGASTRODUODENOSCOPY (EGD);  Surgeon: Golda Claudis PENNER, MD;  [facility];  Service: Endoscopy;  Laterality: N/A;  2:00   ESOPHAGOGASTRODUODENOSCOPY N/A 09/29/2020   Procedure: ESOPHAGOGASTRODUODENOSCOPY (EGD);  Surgeon: Golda Claudis PENNER, MD;  [facility];  Service: Endoscopy;  Laterality: N/A;  130   ESOPHAGOGASTRODUODENOSCOPY (EGD) WITH PROPOFOL  N/A 05/16/2017   Procedure: ESOPHAGOGASTRODUODENOSCOPY (EGD) WITH PROPOFOL ;  Surgeon: Golda Claudis PENNER, MD;  [facility]: [facility];  Service: Endoscopy;  Laterality: N/A;  8:30   excision of mass of back     MALONEY DILATION  08/18/2014   Procedure: MALONEY DILATION;  Surgeon: Claudis PENNER Golda, MD;  [facility]: [facility];  Service: Endoscopy;;   MASS EXCISION N/A 10/21/2013   Procedure: EXCISION MASSES X 2 ON BACK;  Surgeon: Elsie GORMAN Holland, MD;  Location: [facility];  Service: General;  Laterality: N/A;   NASAL SEPTUM SURGERY     NECK SURGERY  2019   NECK SURGERY  2020   POSTERIOR CERVICAL FUSION/FORAMINOTOMY N/A 02/05/2018   Procedure: Posterior Cervical Fusion with lateral mass fixation - Cervical six-seven;  Surgeon: Helling Arley, MD;  Location: [facility];  Service: Neurosurgery;  Laterality: N/A;   POSTERIOR CERVICAL FUSION/FORAMINOTOMY N/A 02/06/2019   Procedure: Posterior Cervical Fusion with lateral mass fixation redo C6-T1;  Surgeon: Helling Arley, MD;  Location: [facility];  Service: Neurosurgery;  Laterality: N/A;   SHOULDER ARTHROSCOPY WITH ROTATOR CUFF REPAIR Left 09/01/2019   Procedure: ARTHROSCOPY SHOULDER ROTATOR CUFF REPAIR;  Surgeon: Dozier Soulier, MD;  Location: [facility];  Service: Orthopedics;  Laterality:  Left;   SHOULDER ARTHROSCOPY WITH SUBACROMIAL DECOMPRESSION Left 09/01/2019   Procedure: SHOULDER ARTHROSCOPY WITH SUBACROMIAL DECOMPRESSION;  Surgeon: Dozier Soulier, MD;  Location: [facility];  Service: Orthopedics;  Laterality: Left;    No family history on file.   Social Connections: Not on file     Current Outpatient Medications  Medication Instructions   amLODipine  (NORVASC ) 5 mg, Oral, Daily   betamethasone valerate ointment (VALISONE) 0.1 % As needed   cholecalciferol (VITAMIN D3) 1,000 Units, Daily    diltiazem  (CARDIZEM  SR) 60 mg, 2 times daily   gabapentin  (NEURONTIN ) 100 mg, Oral, 3 times daily   HUMIRA, 2 PEN, 40 MG/0.4ML pen 0.4 mLs, As directed   ILEVRO 0.3 % ophthalmic suspension SMARTSIG:In Eye(s)   moxifloxacin (VIGAMOX) 0.5 % ophthalmic solution Apply to eye.   nystatin  (MYCOSTATIN ) 500,000 Units, Oral, 4 times daily   omeprazole  (PRILOSEC) 40 MG capsule TAKE (1) CAPSULE BY MOUTH ONCE DAILY.   pravastatin  (PRAVACHOL ) 40 mg, Daily   prednisoLONE acetate (PRED FORTE) 1 % ophthalmic suspension 1 drop, 3 times daily     Physical Exam:   BP 125/81 (BP Location: Right Arm, Patient Position: Sitting)   Pulse 81   SpO2 93%   Salient findings:  CN II-XII intact with the exception of tongue numbness   Bilateral EAC clear and TM intact with well pneumatized middle ear spaces  Oral mucosa moist, upper and lower dentures.  White patches on palate resolved.  No lesions on the tongue.  Now having some findings on buccal mucosa near the oral commissure on both sides some hypervascularity and minor ulceration.? Lichen planus   No neck swelling today.  Less painful to palpation at superior aspect of SCM today.  No skin changes or other masses noted.  No respiratory distress or stridor  Seprately Identifiable Procedures:  Prior to initiating any procedures, risks/benefits/alternatives were explained to the patient and verbal consent obtained. None  Impression & Plans:  Xavier Livingston is a 69 y.o. male with   1. Burning mouth syndrome   2. Lichen planus     Assessment and Plan Assessment & Plan Chronic neck pain and swelling with odnophagia and tongue numbness Chronic neck pain and swelling with odnophagia and tongue numbness for over a year. Concern for malignancy due to smoking history. Previous scope exam was normal 1 year ago. Recommended pt undergo TFL given significant smoking history, pain with swallowing and left neck pain/ swelling. He declined. Discussed that it was  important to do this exam to evaluate for possible cancer. Pt states he has had this done before and he does not want this performed today.  -CT neck on 10/28/2024 reassuring with no concerning neck findings -Today and neck pain seems less a concern and the oral burning and paresthesia is more concerning to him.  Will continue monitoring the neck pain  Bilateral tongue numbness and burning Burning mouth syndrome Possible lichen planus Chronic bilateral burning and numbness of the tongue and oral cavity, exacerbated by eating and drinking, consistent with burning mouth syndrome. Bilateral distribution makes a focal cranial nerve lesion unlikely.  We did discuss getting an MRI but he wants to defer this for now.  We discussed burning mouth syndrome and at this time he thinks his symptoms are most consistent with this. Discussed need further workup of nutritional deficiencies.  Additionally his symptoms have been worsening and he now has minor ulcerations on the buccal mucosa near his oral commissures.  Looks somewhat consistent with  lichen planus -Discussed options today.  We will order a lab panel to assess for nutritional deficiencies.  At the same time we will make a referral given new findings in the oral cavity, rule out*** lichen planus.  I do not have concerns for cancer at this time given the appearance and symmetric, bilateral. - Scheduled follow-up in 1 month - Instructed to call if symptoms worsen before then - Discussed MRI for cranial nerve evaluation if symptoms persist or worsen, but deferred for now.   See below regarding exact medications prescribed this encounter including dosages and route: No orders of the defined types were placed in this encounter.  I personally spent a total of 48 minutes in the care of the patient today including preparing to see the patient, getting/reviewing separately obtained history, and counseling and educating. Had thorough discussion regarding my  recommendation for scope exam but pt declined.    Thank you for allowing me the opportunity to care for your patient. Please do not hesitate to contact me should you have any other questions.  Sincerely, Hadassah Parody, MD Otolaryngologist (ENT), [facility] Phone: [phone] Fax: [phone]   MDM:  Level 4 Complexity/Problems addressed: 4-chronic worsening problem Data complexity: 4- ordered multiple labs  - Morbidity:  - Prescription Drug prescribed or managed: no

## 2024-12-24 ENCOUNTER — Other Ambulatory Visit (INDEPENDENT_AMBULATORY_CARE_PROVIDER_SITE_OTHER): Payer: Self-pay

## 2024-12-25 ENCOUNTER — Encounter (INDEPENDENT_AMBULATORY_CARE_PROVIDER_SITE_OTHER): Payer: Self-pay

## 2024-12-25 ENCOUNTER — Other Ambulatory Visit (INDEPENDENT_AMBULATORY_CARE_PROVIDER_SITE_OTHER): Payer: Self-pay

## 2024-12-25 DIAGNOSIS — B370 Candidal stomatitis: Secondary | ICD-10-CM

## 2024-12-25 DIAGNOSIS — L439 Lichen planus, unspecified: Secondary | ICD-10-CM

## 2024-12-25 DIAGNOSIS — K146 Glossodynia: Secondary | ICD-10-CM

## 2025-01-03 LAB — CBC WITH DIFFERENTIAL/PLATELET
Absolute Lymphocytes: 2436 {cells}/uL (ref 850–3900)
Absolute Monocytes: 851 {cells}/uL (ref 200–950)
Basophils Absolute: 62 {cells}/uL (ref 0–200)
Basophils Relative: 1.1 %
Eosinophils Absolute: 179 {cells}/uL (ref 15–500)
Eosinophils Relative: 3.2 %
HCT: 46.1 % (ref 39.4–51.1)
Hemoglobin: 15 g/dL (ref 13.2–17.1)
MCH: 31.7 pg (ref 27.0–33.0)
MCHC: 32.5 g/dL (ref 31.6–35.4)
MCV: 97.5 fL (ref 81.4–101.7)
MPV: 9.5 fL (ref 7.5–12.5)
Monocytes Relative: 15.2 %
Neutro Abs: 2072 {cells}/uL (ref 1500–7800)
Neutrophils Relative %: 37 %
Platelets: 363 10*3/uL (ref 140–400)
RBC: 4.73 Million/uL (ref 4.20–5.80)
RDW: 13.7 % (ref 11.0–15.0)
Total Lymphocyte: 43.5 %
WBC: 5.6 10*3/uL (ref 3.8–10.8)

## 2025-01-03 LAB — B12 AND FOLATE PANEL
Folate: 3.7 ng/mL — ABNORMAL LOW
Vitamin B-12: 306 pg/mL (ref 200–1100)

## 2025-01-03 LAB — SJOGREN'S SYNDROME ANTIBODS(SSA + SSB)
SSA (Ro) (ENA) Antibody, IgG: <1 AI
SSB (La) (ENA) Antibody, IgG: <1 AI

## 2025-01-03 LAB — IRON,TIBC AND FERRITIN PANEL
%SAT: 49 % — ABNORMAL HIGH (ref 20–48)
Ferritin: 817 ng/mL — ABNORMAL HIGH (ref 24–380)
Iron: 140 ug/dL (ref 50–180)
TIBC: 286 ug/dL (ref 250–425)

## 2025-01-03 LAB — ZINC: Zinc: 103 ug/dL (ref 60–130)

## 2025-01-03 LAB — TSH+FREE T4: TSH W/REFLEX TO FT4: 0.99 m[IU]/L (ref 0.40–4.50)

## 2025-01-03 LAB — ANA: Anti Nuclear Antibody (ANA): NEGATIVE

## 2025-01-03 LAB — HEMOGLOBIN A1C: Hgb A1c MFr Bld: 5.6 %

## 2025-01-11 ENCOUNTER — Ambulatory Visit (INDEPENDENT_AMBULATORY_CARE_PROVIDER_SITE_OTHER)
